# Patient Record
Sex: Male | Born: 1935 | Race: Black or African American | Hispanic: No | Marital: Married | Smoking: Former smoker
Health system: Southern US, Community
[De-identification: ages and names within clinical notes are randomized; demographics above are authoritative.]

## PROBLEM LIST (undated history)

## (undated) DIAGNOSIS — J449 Chronic obstructive pulmonary disease, unspecified: Secondary | ICD-10-CM

## (undated) DIAGNOSIS — E119 Type 2 diabetes mellitus without complications: Secondary | ICD-10-CM

## (undated) DIAGNOSIS — I10 Essential (primary) hypertension: Secondary | ICD-10-CM

## (undated) DIAGNOSIS — C801 Malignant (primary) neoplasm, unspecified: Secondary | ICD-10-CM

## (undated) DIAGNOSIS — I509 Heart failure, unspecified: Secondary | ICD-10-CM

## (undated) DIAGNOSIS — I639 Cerebral infarction, unspecified: Secondary | ICD-10-CM

## (undated) DIAGNOSIS — N289 Disorder of kidney and ureter, unspecified: Secondary | ICD-10-CM

## (undated) DIAGNOSIS — J439 Emphysema, unspecified: Secondary | ICD-10-CM

## (undated) HISTORY — PX: PROSTATECTOMY: SHX69

---

## 2008-03-01 ENCOUNTER — Inpatient Hospital Stay (HOSPITAL_COMMUNITY): Admission: EM | Admit: 2008-03-01 | Discharge: 2008-03-03 | Payer: Self-pay | Admitting: Emergency Medicine

## 2010-05-16 LAB — HEPATIC FUNCTION PANEL
ALT: 37 U/L (ref 0–53)
AST: 34 U/L (ref 0–37)
Albumin: 2.8 g/dL — ABNORMAL LOW (ref 3.5–5.2)
Alkaline Phosphatase: 41 U/L (ref 39–117)
Indirect Bilirubin: 0.5 mg/dL (ref 0.3–0.9)
Total Protein: 4.6 g/dL — ABNORMAL LOW (ref 6.0–8.3)

## 2010-05-16 LAB — BASIC METABOLIC PANEL
BUN: 18 mg/dL (ref 6–23)
CO2: 34 mEq/L — ABNORMAL HIGH (ref 19–32)
CO2: 36 mEq/L — ABNORMAL HIGH (ref 19–32)
CO2: 37 mEq/L — ABNORMAL HIGH (ref 19–32)
Calcium: 7.8 mg/dL — ABNORMAL LOW (ref 8.4–10.5)
Creatinine, Ser: 2.4 mg/dL — ABNORMAL HIGH (ref 0.4–1.5)
GFR calc Af Amer: 29 mL/min — ABNORMAL LOW (ref >60)
GFR calc non Af Amer: 24 mL/min — ABNORMAL LOW (ref >60)
GFR calc non Af Amer: 25 mL/min — ABNORMAL LOW (ref >60)
GFR calc non Af Amer: 26 mL/min — ABNORMAL LOW (ref >60)
Glucose, Bld: 143 mg/dL — ABNORMAL HIGH (ref 70–99)
Glucose, Bld: 197 mg/dL — ABNORMAL HIGH (ref 70–99)
Glucose, Bld: 84 mg/dL (ref 70–99)
Potassium: 3.3 mEq/L — ABNORMAL LOW (ref 3.5–5.1)
Potassium: 3.8 mEq/L (ref 3.5–5.1)
Sodium: 143 mEq/L (ref 135–145)
Sodium: 145 mEq/L (ref 135–145)

## 2010-05-16 LAB — GLUCOSE, CAPILLARY
Glucose-Capillary: 103 mg/dL — ABNORMAL HIGH (ref 70–99)
Glucose-Capillary: 103 mg/dL — ABNORMAL HIGH (ref 70–99)
Glucose-Capillary: 107 mg/dL — ABNORMAL HIGH (ref 70–99)
Glucose-Capillary: 110 mg/dL — ABNORMAL HIGH (ref 70–99)
Glucose-Capillary: 118 mg/dL — ABNORMAL HIGH (ref 70–99)
Glucose-Capillary: 135 mg/dL — ABNORMAL HIGH (ref 70–99)
Glucose-Capillary: 138 mg/dL — ABNORMAL HIGH (ref 70–99)
Glucose-Capillary: 145 mg/dL — ABNORMAL HIGH (ref 70–99)
Glucose-Capillary: 158 mg/dL — ABNORMAL HIGH (ref 70–99)
Glucose-Capillary: 176 mg/dL — ABNORMAL HIGH (ref 70–99)
Glucose-Capillary: 213 mg/dL — ABNORMAL HIGH (ref 70–99)
Glucose-Capillary: 225 mg/dL — ABNORMAL HIGH (ref 70–99)
Glucose-Capillary: 289 mg/dL — ABNORMAL HIGH (ref 70–99)
Glucose-Capillary: 293 mg/dL — ABNORMAL HIGH (ref 70–99)
Glucose-Capillary: 38 mg/dL — CL (ref 70–99)
Glucose-Capillary: 71 mg/dL (ref 70–99)
Glucose-Capillary: 73 mg/dL (ref 70–99)
Glucose-Capillary: 94 mg/dL (ref 70–99)
Glucose-Capillary: 96 mg/dL (ref 70–99)
Glucose-Capillary: 98 mg/dL (ref 70–99)

## 2010-05-16 LAB — URINALYSIS, ROUTINE W REFLEX MICROSCOPIC
Bilirubin Urine: NEGATIVE
Ketones, ur: NEGATIVE mg/dL
Nitrite: NEGATIVE
Specific Gravity, Urine: 1.015 (ref 1.005–1.030)
pH: 6 (ref 5.0–8.0)

## 2010-05-16 LAB — CULTURE, BLOOD (ROUTINE X 2): Report Status: 2062010

## 2010-05-16 LAB — DIFFERENTIAL
Basophils Absolute: 0 10*3/uL (ref 0.0–0.1)
Basophils Absolute: 0 10*3/uL (ref 0.0–0.1)
Basophils Relative: 0 % (ref 0–1)
Eosinophils Absolute: 0.3 10*3/uL (ref 0.0–0.7)
Eosinophils Relative: 0 % (ref 0–5)
Lymphocytes Relative: 1 % — ABNORMAL LOW (ref 12–46)
Lymphocytes Relative: 3 % — ABNORMAL LOW (ref 12–46)
Lymphs Abs: 0.3 10*3/uL — ABNORMAL LOW (ref 0.7–4.0)
Monocytes Absolute: 0.6 10*3/uL (ref 0.1–1.0)
Monocytes Absolute: 1.1 10*3/uL — ABNORMAL HIGH (ref 0.1–1.0)
Monocytes Relative: 3 % (ref 3–12)
Monocytes Relative: 5 % (ref 3–12)
Neutro Abs: 12.2 10*3/uL — ABNORMAL HIGH (ref 1.7–7.7)
Neutro Abs: 12.6 10*3/uL — ABNORMAL HIGH (ref 1.7–7.7)
Neutrophils Relative %: 91 % — ABNORMAL HIGH (ref 43–77)
Neutrophils Relative %: 93 % — ABNORMAL HIGH (ref 43–77)

## 2010-05-16 LAB — RETICULOCYTES: Retic Ct Pct: 1 % (ref 0.4–3.1)

## 2010-05-16 LAB — CBC
HCT: 30.7 % — ABNORMAL LOW (ref 39.0–52.0)
Hemoglobin: 9.5 g/dL — ABNORMAL LOW (ref 13.0–17.0)
Hemoglobin: 9.8 g/dL — ABNORMAL LOW (ref 13.0–17.0)
MCHC: 31 g/dL (ref 30.0–36.0)
MCHC: 31.8 g/dL (ref 30.0–36.0)
Platelets: 61 10*3/uL — ABNORMAL LOW (ref 150–400)
Platelets: 70 10*3/uL — ABNORMAL LOW (ref 150–400)
RBC: 3.52 MIL/uL — ABNORMAL LOW (ref 4.22–5.81)
RBC: 3.6 MIL/uL — ABNORMAL LOW (ref 4.22–5.81)
RDW: 18.4 % — ABNORMAL HIGH (ref 11.5–15.5)
RDW: 18.6 % — ABNORMAL HIGH (ref 11.5–15.5)
WBC: 13.2 10*3/uL — ABNORMAL HIGH (ref 4.0–10.5)

## 2010-05-16 LAB — IRON AND TIBC
Iron: 61 ug/dL (ref 42–135)
Saturation Ratios: 33 % (ref 20–55)

## 2010-06-13 NOTE — Discharge Summary (Signed)
NAME:  Brad Jones, Brad Jones NO.:  [id number]   MEDICAL RECORD NO.:  [id number]          PATIENT TYPE:  INP   LOCATION:  A304                          FACILITY:  [facility]   PHYSICIAN:  Dorris Singh, DO    DATE OF BIRTH:  Feb 09, 1927   DATE OF ADMISSION:  03/01/2008  DATE OF DISCHARGE:  02/03/2010LH                               DISCHARGE SUMMARY   ADMISSION DIAGNOSES:  1. Refractory hypoglycemia.  2. Type 2 diabetes.  3. Chronic obstructive pulmonary disease requiring supplemental O2.  4. Thrombocytopenia.  5. Chronic kidney disease.  6. Anemia.  7. Leukocytosis.   DISCHARGE DIAGNOSES:  1. Hypoglycemia which is improved.  2. Chronic kidney disease which is better.  3. Leukocytosis which is improving.  4. Thrombocytopenia.  5. Chronic obstructive pulmonary disease requiring supplemental      oxygen.  6. Type 2 diabetes.   The patient's H and P was done by Dr. Rito Ehrlich.  Please refer.   RADIOLOGY TESTING:  Includes portable chest x-ray on February 1 which  demonstrated COPD, emphysema, cardiomegaly, no definite acute findings.   HOSPITAL COURSE:  The patient was admitted for the above diagnoses.  He  was started on IV fluids with D10.  As his blood sugars continued to  increase he was then switched over to D5 which seemed to improve.  Then,  he was taken off of the IV drip altogether and his blood sugars have  remained stable.  Chronic kidney disease, this remained stable while he  was here.  The patient's leukocytosis, he is on Avelox and we will  continue this when he is discharged home.  His thrombocytopenia is  something that is chronic.  [facility] did  not receive his [facility] that were requested.  For COPD, the patient has  received nebulizer treatments and also supplemental O2.  He also takes  daily steroids and we will have him continue with that too.  The patient  have a irregular heart rhythm day before yesterday, but has  not had any  issues with that since then.  His a type 2 diabetic medications were  held.  We checked his hemoglobin A1c which demonstrated at 5.9 when it  was checked, which is within normal limits.  For hypoglycemia, we will  also decrease the amount of glipizide that he takes a day to see if that  improves his hypoglycemia and also recommend him to follow up with the  [facility] doctor.  I talked extensively with family members and the patient  about finding local doctors at if that is possible for him to do due to  his chronic medical conditions.   PHYSICAL EXAM:  VITAL SIGNS:  The patient was seen today.  Temperature  98.9, pulse 101, respirations 20, blood pressure 114/57.  GENERAL:  The patient is a 77 year old African male who is well  developed, well nourished with supplemental O2.  HEART:  Regular rate and rhythm.  LUNGS:  Decreased breath sounds throughout.  ABDOMEN:  Round, soft, nontender.  EXTREMITIES:  Positive pulses.  LABS:  Today are as follows.  White count 13.2, hemoglobin 9.6,  hematocrit 30.4, platelet count of 61,000.  Blood sugar 158, sodium 143,  potassium 3.8, chloride 99, CO2 36, glucose 197, BUN 16, creatinine 2.8.   CONDITION:  Stable.   DISCHARGE MEDICATIONS:  1. Glipizide 10 mg twice a day.  We will decrease the patient's      original glipizide to 5 mg twice a day.  2. Nitroglycerin 0.4 mg sublingual as needed.  3. Cloistral ointment topical twice a day.  4. Dermacin topical twice a day.  5. Fluticasone topical 0.5 twice a day.  6. Hydralazine 10 mg bedtime.  7. Vicodin 5/225 one tablet as needed.  8. Simvastatin 40 mg at bedtime.  9. Prednisone specialized dosing.  10.Moxifloxacin 400 mg daily.  11.Combivent inhaler 2 puffs 4 times a day.  12.Mometasone inhaler 2 puffs as needed.   His condition is stable.  His disposition will be to home.      Dorris Singh, DO  Electronically Signed     CB/MEDQ  D:  03/03/2008  T:  03/03/2008  Job:   [id number]

## 2010-06-13 NOTE — H&P (Signed)
NAME:  Brad Jones, Brad Jones NO.:  [id number]   MEDICAL RECORD NO.:  [id number]          PATIENT TYPE:  EMS   LOCATION:  ED                            FACILITY:  [facility]   PHYSICIAN:  Osvaldo Shipper, MD     DATE OF BIRTH:  12-23-1927   DATE OF ADMISSION:  03/01/2008  DATE OF DISCHARGE:  LH                              HISTORY & PHYSICAL   Patient goes to the [place] where he is followed by Dr. Mardee Postin.   Patient was recently discharged from the [facility] this past Thursday  after a 2-week stay for pneumonia, complicated by hemothorax.   ADMISSION DIAGNOSES:  1. Refractory hypoglycemia.  2. Type 2 diabetes.  3. Chronic obstructive pulmonary disease.  4. Thrombocytopenia.  5. Chronic kidney disease.  6. Anemia.  7. Leukocytosis, likely secondary to steroids.   CHIEF COMPLAINT:  Low blood sugar.   HISTORY OF PRESENT ILLNESS:  Patient is a 77 year old African American  male who was in his usual state of health this morning when he was found  to be disoriented by family members.  He slipped out of the bed as well.  They denied any syncopal episode.  He was having slurred speech.  CBG  was checked, which was 12.  Glucagon was given by EMS.  Patient's CBG  improved to 34, but it has been refractory to multiple D50's , and the  last reading is actually 44.  Patient was started on a D5 drip, and we  were called to assist with further management.   Currently, the patient denies any significant symptoms apart from his  chronic back pain.  He is a little bit short of breath, but he does use  O2 at home.  He is still having some pain from his chest tube on the  right side.  He has been having decreased p.o. intake, according to his  wife and daughter.  He has had low blood sugars in the past as well.   HOME MEDICATIONS:  1. Glipizide 10 mg b.i.d.  2. Nitroglycerin sublingually as needed.  3. Clobetasol ointment.  4. Fluocinonide ointment.  5. Salicylate acid sulfa  shampoo for psoriasis.  6. Dermacerin also for psoriasis.  7. Vicodin as needed.  8. Simvastatin 40 mg at bedtime.  9. Hydroxyzine 10 mg at bedtime.  10.Prednisone on a tapering dose.  11.Moxifloxacin 400 mg.  He was prescribed for 9 days, but he has      completed about 3-4 days.  12.Combivent 2 puffs as needed for 4 times a day.  13.Mometasone inhaler 2 puffs,  I believe, as needed.  14.No insulin at home.  15.He is on O2 at 2 liters.  16.He did tell me he is also on a nitro patch.   He was asked to discontinue his Lasix, HCTZ, and lisinopril when he was  discharged from the [place].   ALLERGIES:  No known drug allergies.   PAST MEDICAL HISTORY:  Positive for recent admission for complicated  pneumonia with hemothorax requiring chest tube placement.  He also has  type 2  diabetes, history of heart disease of unknown type, history of  TIAs.  No history of MI.  He also has chronic kidney disease.  According  to his daughter, he was told he had low platelets at the [place].   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   SOCIAL HISTORY:  He lives in [place] with his family.  He quit  smoking many years ago.  No alcohol use.  No illicit drug use.  He uses  a cane to ambulate.   FAMILY HISTORY:  Noncontributory.   REVIEW OF SYSTEMS:  GENERAL:  Positive for weakness, malaise  unremarkable.  HEENT:  Unremarkable.  CARDIOVASCULAR:  Unremarkable.  RESPIRATORY:  As in HPI.  GI:  Unremarkable.  GU:  Unremarkable.  ENDOCRINE:  As in HPI.  NEUROLOGIC:  Unremarkable.  PSYCHIATRIC:  Unremarkable.  Other systems unremarkable.   PHYSICAL EXAMINATION:  VITAL SIGNS:  Temperature 97.7, blood pressure  130/85, heart rate 80's, irregular.  Respiratory rate is 24.  Saturation  initially was 88% on room air, then 100% on 2 liters.  GENERAL:  This is an obese black male in no distress.  HEENT:  There is no pallor, no icterus.   Oral mucous membranes are  moist.  No oral lesions are noted.  NECK:  Soft and supple.  No thyromegaly is appreciated.  LUNGS:  A few scattered end-expiratory wheezes bilaterally.  No crackles  are present.  CARDIOVASCULAR:  S1 and S2 is irregular.  No murmurs appreciated.  No  S3, S4, no rubs or bruits.  ABDOMEN:  Soft, nontender, nondistended.  Over his right side of the  abdomen as well as the chest, there is bruising noted, presumably from  his recent admission.  He actually has a large dressing on the right  chest wall.  EXTREMITIES:  Pitting edema 1+ bilaterally.  SKIN:  Psoriatic lesions over his knees and his lower back.  NEUROLOGIC:  Alert and oriented x3.  No focal neuro deficits are  present.   LAB DATA:  White count 23,000.  Hemoglobin is 9.8.  Hematocrit is 30.7.  MCV is 85.  Platelet count is 81.  Sodium 145, potassium 3.3, chloride  106, bicarb 34, glucose 84, BUN 25, creatinine 2.58.  UA shows moderate  blood, 7-10 RBCs.   Chest x-ray actually shows COPD with no acute findings.   ASSESSMENT:  A 77 year old [place] American male who has a past medical  history, as stated earlier, who presents with hypoglycemia.  This has  been refractory so far to the D50's.  I think this is the result of a  combination of renal failure, poor p.o. intake, and the fact that he is  on glipizide.   PLAN:  1. Refractory hypoglycemia:  We will put him on a D10 drip until his      blood sugar stabilizes.  Obviously hold his glipizide and monitor      him closely on the floor.  2. Irregular heart rhythm:  Check an EKG.  3. Thrombocytopenia:  Likely the result of his acute illness recently.      We will follow it closely.  4. Leukocytosis:  Likely a result of recent infection and the fact      that he is on steroids.  We will continue with the steroid taper      for now.  5. Chronic kidney disease:  We will await  labs from [facility] to      check how his kidney function was over  there.  6. Chronic obstructive pulmonary disease:  Start him on nebulizer      treatments.  Monitor him closely.   Further management decision will be based on the results of initial  testing and patient's response to treatment.      Osvaldo Shipper, MD  Electronically Signed     GK/MEDQ  D:  03/01/2008  T:  03/01/2008  Job:  [id number]   cc:   Dr. Otis Dials, [facility]

## 2010-06-13 NOTE — Group Therapy Note (Signed)
NAME:  Brad Jones, GOBERT NO.:  [id number]   MEDICAL RECORD NO.:  [id number]          PATIENT TYPE:  INP   LOCATION:  A304                          FACILITY:  [facility]   PHYSICIAN:  Osvaldo Shipper, MD     DATE OF BIRTH:  12-Jul-1927   DATE OF PROCEDURE:  03/02/2008  DATE OF DISCHARGE:                                 PROGRESS NOTE   SUBJECTIVE:  The patient is feeling better.  He denies any shortness of  breath or chest pain.  He does not have any new complaints today.   OBJECTIVE:  VITAL SIGNS:  Temperature 98.7, heart rate 99 and regular,  respiratory rate 20, blood pressure 147/92, saturation 95% on 3 liters.  GENERAL:  This is an elderly black male in no distress.  HEENT:  There is no pallor, no icterus.  Oral mucous membranes moist.  No oral lesions are noted.  NECK:  Soft and supple.  LUNGS:  Reveal poor entry at the bases, otherwise clear to auscultation.  CARDIOVASCULAR:  S1-S2 is normal, regular.  ABDOMEN:  Soft, nontender, nondistended.  EXTREMITIES:  Show no edema.  SKIN:  Shows psoriatic lesions over both knees.   LABORATORY DATA:  This morning, his white count has improved to 13,900  from 23,000 yesterday.  Hemoglobin is 9.5 which is stable.  MCV is 86,  platelet count is 70, which has decreased slightly from 81 yesterday.  His CBGs, the last two readings have been greater than 200, it has been  slowly rising since yesterday.  His renal function shows improvement in  BUN to 18, creatinine is 2.4, bicarb is 37.  Blood cultures negative so  far.   ASSESSMENT:  1. Hypoglycemia.  This seems to be improving.  He is still on a D10      drip.  If his blood sugars for the next two readings are greater      than 200, he can be changed over to a D5 drip.  Continue CBGs q.3      h., after that and if they are still elevated, the D5 can be      changed over to saline lock.  The reason for his hypoglycemia is      likely the sulfonylurea that the patient was on in  combination with      his renal failure and poor p.o. intake at home.  2. Chronic kidney disease, stable.  3. Leukocytosis likely a sequelae of his recent infection in the form      of pneumonia, as well as his prednisone use.  It is improving.  4. Thrombocytopenia.  I was told by his daughter that he was noted to      have low platelets even at the [place], so this probably is also      chronic.  We are awaiting records from the [place] to corroborate this.  5. History of chronic obstructive pulmonary disease.  He is on      nebulizer treatments.  He was having wheezing yesterday which seems      to have resolved  at this time.  He will be continued on the      nebulizers.  He is already on steroids.  6. He was noted to have an irregular heart rhythm yesterday.  EKG was      done which shows sinus rhythm with marked sinus arrhythmia.   For his type 2 diabetes, we are obviously holding all of his diabetic  medications.  HbA1c is pending at this time.   Anticipating his blood sugars continue to improve and he will be able to  go home by tomorrow.      Osvaldo Shipper, MD  Electronically Signed     GK/MEDQ  D:  03/02/2008  T:  03/02/2008  Job:  [id number]

## 2012-08-09 ENCOUNTER — Emergency Department (HOSPITAL_COMMUNITY): Payer: Medicare Other

## 2012-08-09 ENCOUNTER — Inpatient Hospital Stay (HOSPITAL_COMMUNITY)
Admission: EM | Admit: 2012-08-09 | Discharge: 2012-08-18 | DRG: 870 | Disposition: A | Discharge status: Rehabilitation Facility | Payer: Medicare Other | Attending: Internal Medicine | Admitting: Internal Medicine

## 2012-08-09 ENCOUNTER — Encounter (HOSPITAL_COMMUNITY): Payer: Self-pay

## 2012-08-09 DIAGNOSIS — R5381 Other malaise: Secondary | ICD-10-CM | POA: Diagnosis not present

## 2012-08-09 DIAGNOSIS — H3530 Unspecified macular degeneration: Secondary | ICD-10-CM | POA: Diagnosis present

## 2012-08-09 DIAGNOSIS — Z8546 Personal history of malignant neoplasm of prostate: Secondary | ICD-10-CM

## 2012-08-09 DIAGNOSIS — Z87891 Personal history of nicotine dependence: Secondary | ICD-10-CM

## 2012-08-09 DIAGNOSIS — G9341 Metabolic encephalopathy: Secondary | ICD-10-CM | POA: Diagnosis present

## 2012-08-09 DIAGNOSIS — N179 Acute kidney failure, unspecified: Secondary | ICD-10-CM

## 2012-08-09 DIAGNOSIS — J9600 Acute respiratory failure, unspecified whether with hypoxia or hypercapnia: Secondary | ICD-10-CM | POA: Diagnosis present

## 2012-08-09 DIAGNOSIS — J9690 Respiratory failure, unspecified, unspecified whether with hypoxia or hypercapnia: Secondary | ICD-10-CM

## 2012-08-09 DIAGNOSIS — A419 Sepsis, unspecified organism: Secondary | ICD-10-CM | POA: Diagnosis present

## 2012-08-09 DIAGNOSIS — N184 Chronic kidney disease, stage 4 (severe): Secondary | ICD-10-CM | POA: Diagnosis present

## 2012-08-09 DIAGNOSIS — I129 Hypertensive chronic kidney disease with stage 1 through stage 4 chronic kidney disease, or unspecified chronic kidney disease: Secondary | ICD-10-CM | POA: Diagnosis present

## 2012-08-09 DIAGNOSIS — D696 Thrombocytopenia, unspecified: Secondary | ICD-10-CM | POA: Diagnosis present

## 2012-08-09 DIAGNOSIS — Z79899 Other long term (current) drug therapy: Secondary | ICD-10-CM

## 2012-08-09 DIAGNOSIS — D649 Anemia, unspecified: Secondary | ICD-10-CM | POA: Diagnosis present

## 2012-08-09 DIAGNOSIS — N170 Acute kidney failure with tubular necrosis: Secondary | ICD-10-CM | POA: Diagnosis present

## 2012-08-09 DIAGNOSIS — I69959 Hemiplegia and hemiparesis following unspecified cerebrovascular disease affecting unspecified side: Secondary | ICD-10-CM

## 2012-08-09 DIAGNOSIS — I2789 Other specified pulmonary heart diseases: Secondary | ICD-10-CM | POA: Diagnosis present

## 2012-08-09 DIAGNOSIS — Z9079 Acquired absence of other genital organ(s): Secondary | ICD-10-CM

## 2012-08-09 DIAGNOSIS — Z7982 Long term (current) use of aspirin: Secondary | ICD-10-CM

## 2012-08-09 DIAGNOSIS — J13 Pneumonia due to Streptococcus pneumoniae: Secondary | ICD-10-CM | POA: Diagnosis present

## 2012-08-09 DIAGNOSIS — A409 Streptococcal sepsis, unspecified: Principal | ICD-10-CM | POA: Diagnosis present

## 2012-08-09 DIAGNOSIS — J189 Pneumonia, unspecified organism: Secondary | ICD-10-CM

## 2012-08-09 DIAGNOSIS — L408 Other psoriasis: Secondary | ICD-10-CM | POA: Diagnosis present

## 2012-08-09 DIAGNOSIS — I509 Heart failure, unspecified: Secondary | ICD-10-CM | POA: Diagnosis present

## 2012-08-09 DIAGNOSIS — J438 Other emphysema: Secondary | ICD-10-CM | POA: Diagnosis present

## 2012-08-09 DIAGNOSIS — R197 Diarrhea, unspecified: Secondary | ICD-10-CM | POA: Diagnosis not present

## 2012-08-09 DIAGNOSIS — R6521 Severe sepsis with septic shock: Secondary | ICD-10-CM | POA: Diagnosis present

## 2012-08-09 DIAGNOSIS — E119 Type 2 diabetes mellitus without complications: Secondary | ICD-10-CM | POA: Diagnosis present

## 2012-08-09 DIAGNOSIS — E870 Hyperosmolality and hypernatremia: Secondary | ICD-10-CM | POA: Diagnosis present

## 2012-08-09 DIAGNOSIS — J9601 Acute respiratory failure with hypoxia: Secondary | ICD-10-CM

## 2012-08-09 HISTORY — DX: Heart failure, unspecified: I50.9

## 2012-08-09 HISTORY — DX: Essential (primary) hypertension: I10

## 2012-08-09 HISTORY — DX: Disorder of kidney and ureter, unspecified: N28.9

## 2012-08-09 HISTORY — DX: Malignant (primary) neoplasm, unspecified: C80.1

## 2012-08-09 HISTORY — DX: Emphysema, unspecified: J43.9

## 2012-08-09 HISTORY — DX: Chronic obstructive pulmonary disease, unspecified: J44.9

## 2012-08-09 HISTORY — DX: Type 2 diabetes mellitus without complications: E11.9

## 2012-08-09 HISTORY — DX: Cerebral infarction, unspecified: I63.9

## 2012-08-09 LAB — BLOOD GAS, ARTERIAL
MECHVT: 700 mL
MECHVT: 600 mL
PEEP: 5 cmH2O
Patient temperature: 37
RATE: 16 resp/min
TCO2: 21 mmol/L (ref 0–100)
TCO2: 17 mmol/L (ref 0–100)
pCO2 arterial: 60 mmHg (ref 35.0–45.0)
pCO2 arterial: 28.2 mmHg — ABNORMAL LOW (ref 35.0–45.0)
pH, Arterial: 7.191 — CL (ref 7.350–7.450)
pH, Arterial: 7.448 (ref 7.350–7.450)
pO2, Arterial: 144 mmHg — ABNORMAL HIGH (ref 80.0–100.0)

## 2012-08-09 LAB — COMPREHENSIVE METABOLIC PANEL
ALT: 6 U/L (ref 0–53)
AST: 10 U/L (ref 0–37)
Alkaline Phosphatase: 58 U/L (ref 39–117)
CO2: 27 mEq/L (ref 19–32)
Chloride: 104 mEq/L (ref 96–112)
GFR calc Af Amer: 12 mL/min — ABNORMAL LOW (ref >90)
GFR calc non Af Amer: 10 mL/min — ABNORMAL LOW (ref >90)
Glucose, Bld: 132 mg/dL — ABNORMAL HIGH (ref 70–99)
Sodium: 144 mEq/L (ref 135–145)
Total Bilirubin: 0.5 mg/dL (ref 0.3–1.2)

## 2012-08-09 LAB — CBC WITH DIFFERENTIAL/PLATELET
Band Neutrophils: 13 % — ABNORMAL HIGH (ref 0–10)
Basophils Absolute: 0 10*3/uL (ref 0.0–0.1)
Basophils Relative: 0 % (ref 0–1)
Blasts: 0 %
HCT: 45.5 % (ref 39.0–52.0)
Hemoglobin: 13.4 g/dL (ref 13.0–17.0)
Lymphocytes Relative: 2 % — ABNORMAL LOW (ref 12–46)
MCH: 25.9 pg — ABNORMAL LOW (ref 26.0–34.0)
MCHC: 29.5 g/dL — ABNORMAL LOW (ref 30.0–36.0)
Myelocytes: 0 %
Neutrophils Relative %: 84 % — ABNORMAL HIGH (ref 43–77)
Promyelocytes Absolute: 0 %
RDW: 15.7 % — ABNORMAL HIGH (ref 11.5–15.5)

## 2012-08-09 LAB — URINALYSIS, ROUTINE W REFLEX MICROSCOPIC
Bilirubin Urine: NEGATIVE
Glucose, UA: NEGATIVE mg/dL
Ketones, ur: NEGATIVE mg/dL
Leukocytes, UA: NEGATIVE
pH: 7 (ref 5.0–8.0)

## 2012-08-09 LAB — URINE MICROSCOPIC-ADD ON

## 2012-08-09 MED ORDER — VECURONIUM BROMIDE 10 MG IV SOLR
INTRAVENOUS | Status: AC
  Filled 2012-08-09: qty 10

## 2012-08-09 MED ORDER — DOPAMINE-DEXTROSE 3.2-5 MG/ML-% IV SOLN
INTRAVENOUS | Status: AC
  Filled 2012-08-09: qty 250

## 2012-08-09 MED ORDER — DEXTROSE 5 % IV SOLN
500.0000 mg | Freq: Once | INTRAVENOUS | Status: AC
  Administered 2012-08-09: 500 mg via INTRAVENOUS
  Filled 2012-08-09: qty 500

## 2012-08-09 MED ORDER — ETOMIDATE 2 MG/ML IV SOLN
20.0000 mg | Freq: Once | INTRAVENOUS | Status: AC
  Administered 2012-08-09: 20 mg via INTRAVENOUS

## 2012-08-09 MED ORDER — SUCCINYLCHOLINE CHLORIDE 20 MG/ML IJ SOLN
125.0000 mg | Freq: Once | INTRAMUSCULAR | Status: AC
  Administered 2012-08-09: 125 mg via INTRAVENOUS

## 2012-08-09 MED ORDER — ROCURONIUM BROMIDE 50 MG/5ML IV SOLN
INTRAVENOUS | Status: AC
  Filled 2012-08-09: qty 2

## 2012-08-09 MED ORDER — ETOMIDATE 2 MG/ML IV SOLN
INTRAVENOUS | Status: AC
  Filled 2012-08-09: qty 20

## 2012-08-09 MED ORDER — DOPAMINE-DEXTROSE 3.2-5 MG/ML-% IV SOLN
5.0000 ug/kg/min | Freq: Once | INTRAVENOUS | Status: AC
  Administered 2012-08-09: 5 ug/kg/min via INTRAVENOUS

## 2012-08-09 MED ORDER — SUCCINYLCHOLINE CHLORIDE 20 MG/ML IJ SOLN
INTRAMUSCULAR | Status: AC
  Filled 2012-08-09: qty 1

## 2012-08-09 MED ORDER — LIDOCAINE HCL (CARDIAC) 20 MG/ML IV SOLN
INTRAVENOUS | Status: AC
  Filled 2012-08-09: qty 5

## 2012-08-09 MED ORDER — VECURONIUM BROMIDE 10 MG IV SOLR
7.0000 mg | Freq: Once | INTRAVENOUS | Status: AC
  Administered 2012-08-09: 7 mg via INTRAVENOUS

## 2012-08-09 MED ORDER — DEXTROSE 5 % IV SOLN
1.0000 g | Freq: Once | INTRAVENOUS | Status: AC
  Administered 2012-08-09: 1 g via INTRAVENOUS
  Filled 2012-08-09: qty 10

## 2012-08-09 NOTE — ED Notes (Signed)
[facility] here to transport pt 

## 2012-08-09 NOTE — ED Notes (Signed)
Status update from primary nurse:  Pt has 7.5 ET tube 22 at lips in place with RT present with ventilator, 50F OG tube in place, verified by auscultation, 88F Temp foley catheter in place draining scant urine. Pt has dopamine infusing at 5/mcg/kg/min = 9.64ml/hr. Pt remains sedated at the moment. Pt has 2 IV's in place, 20g rt AC, 20g rt FA.

## 2012-08-09 NOTE — ED Provider Notes (Signed)
History  This chart was scribed for Brad Hutching, MD by Ardelia Mems, ED Scribe. This patient was seen in room APA01/APA01 and the patient's care was started at 7:47 PM.  CSN: [id number]  Arrival date & time 08/09/12  1945   Chief Complaint  Patient presents with  . Unresponsive    The history is provided by the EMS personnel. The history is limited by the condition of the patient. No language interpreter was used.   LEVEL 5 CAVEAT (PATIENT UNRESPONSIVE)  HPI Comments: Brad Jones is a 79 y.o. Male with a hx of HTN brought by [facility] to the Emergency Department who is unresponsive. Pt was at home sitting on his carport, when he suddenly lost consciousness and fell out of his chair. Relatives believed it may have been a diabetic issue- EMS checked this and found a blood sugar of 140. Pt has reportedly not been aroused since becoming unresponsive. He was found with shallow, slow breathing of about 4 to 6 respirations per minute. Pt was given an oral pharyngeal airway and supplemental oxygen. Pt is currently being treated with BVM. EMS reports that pt has strong pulses. Pt's known at home medications include albuterol, lasix and Vicodin. Pt does not have any known allergies to medications. Pt's family is waiting to see pt and may be able to provide more history.        Further history obtained from wife and children. Apparently patient did not feel well all day. He had a pain frothy material in his oral cavity. His wife reported that he did not feel well since early this morning and was essentially bedridden today. He further deteriorated later in the afternoon and EMS was notified   Past Medical History  Diagnosis Date  . Hypertension     History reviewed. No pertinent past surgical history.  No family history on file.  History  Substance Use Topics  . Smoking status: Not on file  . Smokeless tobacco: Not on file  . Alcohol Use: Not on file    Review of Systems  Unable to perform  ROS: Patient unresponsive  A complete 10 system review of systems was obtained and all systems are negative except as noted in the HPI and PMH.   Allergies  Review of patient's allergies indicates no known allergies.  Home Medications   Current Outpatient Rx  Name  Route  Sig  Dispense  Refill  . albuterol (PROVENTIL HFA;VENTOLIN HFA) 108 (90 BASE) MCG/ACT inhaler   Inhalation   Inhale 2 puffs into the lungs every 6 (six) hours as needed for wheezing.         . calcitRIOL (ROCALTROL) 0.25 MCG capsule   Oral   Take 0.25 mcg by mouth daily.         Marland Kitchen docusate sodium (COLACE) 50 MG capsule   Oral   Take by mouth at bedtime.         . fluocinonide ointment (LIDEX) 0.05 %   Topical   Apply topically 2 (two) times daily.         . furosemide (LASIX) 40 MG tablet   Oral   Take 40 mg by mouth daily.         Marland Kitchen HYDROcodone-acetaminophen (NORCO/VICODIN) 5-325 MG per tablet   Oral   Take 1 tablet by mouth every 6 (six) hours as needed for pain.         . metoprolol (TOPROL-XL) 200 MG 24 hr tablet   Oral   Take  200 mg by mouth daily.         Marland Kitchen terazosin (HYTRIN) 2 MG capsule   Oral   Take 2 mg by mouth at bedtime.         Marland Kitchen tiotropium (SPIRIVA) 18 MCG inhalation capsule   Inhalation   Place 18 mcg into inhaler and inhale daily.           Triage Vitals: BP 157/93  Pulse 107  Temp(Src) 98.8 F (37.1 C)  Resp 23  Ht 5\' 8"  (1.727 m)  Wt 220 lb (99.791 kg)  BMI 33.46 kg/m2  SpO2 100%  Physical Exam  Nursing note and vitals reviewed. Constitutional: He is oriented to person, place, and time. He appears well-developed and well-nourished.  HENT:  Head: Normocephalic and atraumatic.  Eyes: Conjunctivae and EOM are normal. Pupils are equal, round, and reactive to light.  Neck: Normal range of motion. Neck supple.  Cardiovascular: Normal rate, regular rhythm and normal heart sounds.   Pulmonary/Chest: Effort normal and breath sounds normal.  Abdominal:  Soft. Bowel sounds are normal.  Musculoskeletal: Normal range of motion.  Neurological: He is alert and oriented to person, place, and time.  Skin: Skin is warm and dry.  Psychiatric: He has a normal mood and affect.    ED Course  Procedures (including critical care time)............... Intubation:     Patient was intubated with a 7.5 endotracheal tube with minimal complications after rapid sequence induction using the following medications.: Etomidate 20 mg IV and succinylcholine 125 mg IV.   Post procedure x-ray shows good ET tube placement.  DIAGNOSTIC STUDIES: Oxygen Saturation is 100% on BVM and supplemental oxygen, normal by my interpretation.    COORDINATION OF CARE: 7:55 PM- Suctioned pt's airway and performed other emergency procedures and medication administrations. 8:36 PM- Recheck and pt is sedated and breathing a little closer to a normal rate.   Medications  lidocaine (cardiac) 100 mg/13ml (XYLOCAINE) 20 MG/ML injection 2% (  Not Given 08/09/12 2134)  rocuronium (ZEMURON) 50 MG/5ML injection (  Not Given 08/09/12 2135)  etomidate (AMIDATE) injection 20 mg (20 mg Intravenous Given 08/09/12 1954)  succinylcholine (ANECTINE) injection 125 mg (125 mg Intravenous Given 08/09/12 1955)  vecuronium (NORCURON) injection 7 mg (7 mg Intravenous Given 08/09/12 2034)  DOPamine (INTROPIN) 800 mg in dextrose 5 % 250 mL infusion (3 mcg/kg/min  99.8 kg Intravenous Rate/Dose Change 08/09/12 2208)  cefTRIAXone (ROCEPHIN) 1 g in dextrose 5 % 50 mL IVPB (0 g Intravenous Stopped 08/09/12 2125)  azithromycin (ZITHROMAX) 500 mg in dextrose 5 % 250 mL IVPB (0 mg Intravenous Stopped 08/09/12 2231)    Labs Reviewed  CBC WITH DIFFERENTIAL - Abnormal; Notable for the following:    WBC 30.1 (*)    MCH 25.9 (*)    MCHC 29.5 (*)    RDW 15.7 (*)    Platelets 109 (*)    Neutrophils Relative % 84 (*)    Lymphocytes Relative 2 (*)    Monocytes Relative 1 (*)    Band Neutrophils 13 (*)    Neutro Abs 29.2  (*)    Lymphs Abs 0.6 (*)    All other components within normal limits  COMPREHENSIVE METABOLIC PANEL - Abnormal; Notable for the following:    Glucose, Bld 132 (*)    BUN 39 (*)    Creatinine, Ser 4.86 (*)    Albumin 2.9 (*)    GFR calc non Af Amer 10 (*)    GFR calc Af Amer 12 (*)  All other components within normal limits  LACTIC ACID, PLASMA - Abnormal; Notable for the following:    Lactic Acid, Venous 6.6 (*)    All other components within normal limits  URINALYSIS, ROUTINE W REFLEX MICROSCOPIC - Abnormal; Notable for the following:    Hgb urine dipstick MODERATE (*)    All other components within normal limits  BLOOD GAS, ARTERIAL - Abnormal; Notable for the following:    pH, Arterial 7.191 (*)    pCO2 arterial 60.0 (*)    pO2, Arterial 78.5 (*)    Acid-base deficit 4.9 (*)    All other components within normal limits  BLOOD GAS, ARTERIAL - Abnormal; Notable for the following:    pCO2 arterial 28.2 (*)    pO2, Arterial 144.0 (*)    Bicarbonate 19.2 (*)    Acid-base deficit 4.1 (*)    All other components within normal limits  URINE MICROSCOPIC-ADD ON - Abnormal; Notable for the following:    Bacteria, UA FEW (*)    All other components within normal limits  CULTURE, BLOOD (ROUTINE X 2)  CULTURE, BLOOD (ROUTINE X 2)  URINE CULTURE  TROPONIN I    Ct Head Wo Contrast  08/09/2012   *RADIOLOGY REPORT*  Clinical Data: Unresponsive.  On ventilator.  Hypertension. Multiple prior infarcts.  Prostate cancer.  CT HEAD WITHOUT CONTRAST  Technique:  Contiguous axial images were obtained from the base of the skull through the vertex without contrast.  Comparison: None.  Findings: Artifact on the present exam slightly limits evaluation.  No intracranial hemorrhage.  Moderate small vessel disease type changes.  Right frontal lobe infarct of indeterminate age.  Global atrophy without hydrocephalus.  Vascular calcifications.  No intracranial mass lesion detected on this unenhanced exam.   Mastoid air cells, middle ear cavities and visualized sinuses are clear.  IMPRESSION: Artifact on the present exam slightly limits evaluation.  No intracranial hemorrhage.  Moderate small vessel disease type changes.  Right frontal lobe infarct of indeterminate age.  Global atrophy without hydrocephalus.   Original Report Authenticated By: Lacy Duverney, M.D.   Dg Chest Portable 1 View  08/09/2012   *RADIOLOGY REPORT*  Clinical Data: Intubated.  PORTABLE CHEST - 1 VIEW  Comparison: 03/01/2008.  Findings: Poorly visualized endotracheal tube with its tip most likely at the level of the clavicles.  Patchy opacity in the right upper lobe.  Minimal patchy opacity at both lung bases.  Increased prominence of the interstitial markings.  The cardiac silhouette remains borderline enlarged.  Thoracic spine degenerative changes.  IMPRESSION:  1.  Poorly visualized endotracheal tube, most likely in satisfactory position. 2.  Right upper lobe probable pneumonia. 3.  Mild bibasilar atelectasis or pneumonia. 4.  Increased interstitial lung disease with stable borderline cardiomegaly.   Original Report Authenticated By: Beckie Salts, M.D.     1. Community acquired pneumonia   2. Respiratory failure     CRITICAL CARE Performed by: Brad Jones Total critical care time: 60 Critical care time was exclusive of separately billable procedures and treating other patients. Critical care was necessary to treat or prevent imminent or life-threatening deterioration. Critical care was time spent personally by me on the following activities: development of treatment plan with patient and/or surrogate as well as nursing, discussions with consultants, evaluation of patient's response to treatment, examination of patient, obtaining history from patient or surrogate, ordering and performing treatments and interventions, ordering and review of laboratory studies, ordering and review of radiographic studies, pulse oximetry and  re-evaluation of  patient's condition.   Date: 08/09/2012  Rate: 107  Rhythm: sinus tachycardia  QRS Axis: right  Intervals: normal  ST/T Wave abnormalities: ST depressions anteriorly  Conduction Disutrbances:none  Narrative Interpretation:   Old EKG Reviewed: changes noted  MDM  Patient was initially obtunded, hypotensive, tachycardic. He had good peripheral pulses. IV access was obtained via the nursing staff. Intubation by self.   Fluid bolus given. Chest x-ray shows probable right lower lobe pneumonia. IV Rocephin and IV Zithromax started. Arterial blood gas showed hypercapnia and a respiratory acidosis. Ventilation rate was increased. Dopamine was started. Blood pressure improved and pulse stayed around 100. Discussed with critical care.       I personally performed the services described in this documentation, which was scribed in my presence. The recorded information has been reviewed and is accurate.       Brad Hutching, MD 08/10/12 2012

## 2012-08-09 NOTE — ED Notes (Signed)
Pt was sitting under carport with family members when he fell over in the chair onto the ground.  Pt remains unresponsive, strong pulses, breathing assisted with bvm per [facility].

## 2012-08-09 NOTE — ED Notes (Signed)
Pt's family going home, will got to [facility] tomorrow. Information collected. Pt's wife Synetta Fail  Pt's daughter Carolyn [place] best number to reach is [phone].

## 2012-08-09 NOTE — ED Notes (Signed)
Pt recognized family members and nodded when wife asked him questions. Pt responded that he is not in pain. VS WDL at this time, awaiting [facility] transfer.

## 2012-08-09 NOTE — ED Notes (Addendum)
Pt's BP trending below 100 systolic, Dopamine changed tp 57mcg/kg/min = 15 ml/hr

## 2012-08-09 NOTE — ED Notes (Signed)
Pt's belongings sent with pt's family

## 2012-08-09 NOTE — ED Notes (Signed)
Vent settings currently 100% O2, 5 PEEP, 24 RR, 600 [place].

## 2012-08-09 NOTE — Accreditation Note (Addendum)
Adjusted PT vent settings after blood gas from rate 24 to 20. VT 600 , peep 5, 100 fio2 still. Pt still etsx out large amount yellow blood stained secretions Awaiting transport to [facility].

## 2012-08-09 NOTE — ED Notes (Signed)
Dopamine rate changed to 3mcg/kg/min = 3.64ml/hr per Dr. Adriana Simas.

## 2012-08-09 NOTE — ED Notes (Signed)
Pt back from CT, VS remain stable, BP improved.

## 2012-08-09 NOTE — ED Notes (Signed)
[facility] ETA 15 minutes 

## 2012-08-10 ENCOUNTER — Inpatient Hospital Stay (HOSPITAL_COMMUNITY): Payer: Medicare Other

## 2012-08-10 DIAGNOSIS — J189 Pneumonia, unspecified organism: Secondary | ICD-10-CM

## 2012-08-10 DIAGNOSIS — G9340 Encephalopathy, unspecified: Secondary | ICD-10-CM

## 2012-08-10 DIAGNOSIS — J9601 Acute respiratory failure with hypoxia: Secondary | ICD-10-CM

## 2012-08-10 DIAGNOSIS — A419 Sepsis, unspecified organism: Secondary | ICD-10-CM

## 2012-08-10 DIAGNOSIS — N179 Acute kidney failure, unspecified: Secondary | ICD-10-CM

## 2012-08-10 DIAGNOSIS — G936 Cerebral edema: Secondary | ICD-10-CM

## 2012-08-10 LAB — STREP PNEUMONIAE URINARY ANTIGEN: Strep Pneumo Urinary Antigen: POSITIVE — AB

## 2012-08-10 LAB — CBC
Hemoglobin: 12.4 g/dL — ABNORMAL LOW (ref 13.0–17.0)
MCHC: 30.2 g/dL (ref 30.0–36.0)
RBC: 4.95 MIL/uL (ref 4.22–5.81)

## 2012-08-10 LAB — POCT I-STAT 3, ART BLOOD GAS (G3+)
Bicarbonate: 22.7 mEq/L (ref 20.0–24.0)
Patient temperature: 100.5
TCO2: 24 mmol/L (ref 0–100)
pCO2 arterial: 43.5 mmHg (ref 35.0–45.0)
pH, Arterial: 7.33 — ABNORMAL LOW (ref 7.350–7.450)
pO2, Arterial: 64 mmHg — ABNORMAL LOW (ref 80.0–100.0)

## 2012-08-10 LAB — BASIC METABOLIC PANEL
CO2: 25 mEq/L (ref 19–32)
GFR calc non Af Amer: 11 mL/min — ABNORMAL LOW (ref >90)
Glucose, Bld: 131 mg/dL — ABNORMAL HIGH (ref 70–99)
Potassium: 4.1 mEq/L (ref 3.5–5.1)
Sodium: 141 mEq/L (ref 135–145)

## 2012-08-10 LAB — URINALYSIS, ROUTINE W REFLEX MICROSCOPIC
Glucose, UA: NEGATIVE mg/dL
Protein, ur: 100 mg/dL — AB
Specific Gravity, Urine: 1.018 (ref 1.005–1.030)

## 2012-08-10 LAB — LACTIC ACID, PLASMA
Lactic Acid, Venous: 4.4 mmol/L — ABNORMAL HIGH (ref 0.5–2.2)
Lactic Acid, Venous: 2.6 mmol/L — ABNORMAL HIGH (ref 0.5–2.2)

## 2012-08-10 LAB — TROPONIN I: Troponin I: 0.82 ng/mL (ref <0.30)

## 2012-08-10 LAB — URINE MICROSCOPIC-ADD ON

## 2012-08-10 LAB — GLUCOSE, CAPILLARY

## 2012-08-10 MED ORDER — FENTANYL BOLUS VIA INFUSION
25.0000 ug | Freq: Four times a day (QID) | INTRAVENOUS | Status: DC | PRN
  Administered 2012-08-12: 50 ug via INTRAVENOUS
  Administered 2012-08-13 (×2): 100 ug via INTRAVENOUS
  Filled 2012-08-10: qty 100

## 2012-08-10 MED ORDER — CHLORHEXIDINE GLUCONATE 0.12 % MT SOLN
15.0000 mL | Freq: Two times a day (BID) | OROMUCOSAL | Status: DC
  Administered 2012-08-10 – 2012-08-14 (×9): 15 mL via OROMUCOSAL
  Filled 2012-08-10 (×8): qty 15
  Filled 2012-08-10: qty 30

## 2012-08-10 MED ORDER — PANTOPRAZOLE SODIUM 40 MG IV SOLR
40.0000 mg | Freq: Every day | INTRAVENOUS | Status: DC
  Administered 2012-08-10 – 2012-08-13 (×5): 40 mg via INTRAVENOUS
  Filled 2012-08-10 (×7): qty 40

## 2012-08-10 MED ORDER — VANCOMYCIN HCL 10 G IV SOLR
1500.0000 mg | INTRAVENOUS | Status: DC
  Administered 2012-08-10 – 2012-08-12 (×2): 1500 mg via INTRAVENOUS
  Filled 2012-08-10 (×2): qty 1500

## 2012-08-10 MED ORDER — ALBUMIN HUMAN 25 % IV SOLN
12.5000 g | Freq: Once | INTRAVENOUS | Status: AC
  Administered 2012-08-10: 12.5 g via INTRAVENOUS
  Filled 2012-08-10: qty 50

## 2012-08-10 MED ORDER — ASPIRIN 300 MG RE SUPP
300.0000 mg | RECTAL | Status: AC
  Administered 2012-08-10: 300 mg via RECTAL
  Filled 2012-08-10: qty 1

## 2012-08-10 MED ORDER — BIOTENE DRY MOUTH MT LIQD
15.0000 mL | Freq: Four times a day (QID) | OROMUCOSAL | Status: DC
  Administered 2012-08-10 – 2012-08-15 (×19): 15 mL via OROMUCOSAL

## 2012-08-10 MED ORDER — MIDAZOLAM HCL 2 MG/2ML IJ SOLN
2.0000 mg | INTRAMUSCULAR | Status: DC | PRN
Start: 2012-08-10 — End: 2012-08-14
  Administered 2012-08-10 – 2012-08-13 (×7): 2 mg via INTRAVENOUS
  Filled 2012-08-10 (×7): qty 2

## 2012-08-10 MED ORDER — PIPERACILLIN-TAZOBACTAM IN DEX 2-0.25 GM/50ML IV SOLN
2.2500 g | Freq: Four times a day (QID) | INTRAVENOUS | Status: DC
  Administered 2012-08-10 – 2012-08-15 (×19): 2.25 g via INTRAVENOUS
  Filled 2012-08-10 (×21): qty 50

## 2012-08-10 MED ORDER — DEXTROSE 5 % IV SOLN
1.0000 g | Freq: Two times a day (BID) | INTRAVENOUS | Status: DC
  Administered 2012-08-10: 1 g via INTRAVENOUS
  Filled 2012-08-10 (×2): qty 10

## 2012-08-10 MED ORDER — SODIUM CHLORIDE 0.9 % IV SOLN: 250.0000 mL | INTRAVENOUS | Status: DC | PRN

## 2012-08-10 MED ORDER — SODIUM CHLORIDE 0.9 % IV SOLN
INTRAVENOUS | Status: DC
  Administered 2012-08-10 – 2012-08-14 (×5): via INTRAVENOUS

## 2012-08-10 MED ORDER — DOPAMINE-DEXTROSE 3.2-5 MG/ML-% IV SOLN: 2.0000 ug/kg/min | INTRAVENOUS | Status: DC

## 2012-08-10 MED ORDER — SODIUM CHLORIDE 0.9 % IV BOLUS (SEPSIS)
1000.0000 mL | Freq: Once | INTRAVENOUS | Status: AC
  Administered 2012-08-10: 1000 mL via INTRAVENOUS

## 2012-08-10 MED ORDER — ALBUTEROL SULFATE HFA 108 (90 BASE) MCG/ACT IN AERS
8.0000 | INHALATION_SPRAY | Freq: Four times a day (QID) | RESPIRATORY_TRACT | Status: DC
  Administered 2012-08-10 – 2012-08-14 (×19): 8 via RESPIRATORY_TRACT
  Filled 2012-08-10 (×2): qty 6.7

## 2012-08-10 MED ORDER — SODIUM CHLORIDE 0.9 % IV BOLUS (SEPSIS)
2000.0000 mL | Freq: Once | INTRAVENOUS | Status: AC
  Administered 2012-08-10: 2000 mL via INTRAVENOUS

## 2012-08-10 MED ORDER — LACTATED RINGERS IV SOLN
INTRAVENOUS | Status: DC
  Administered 2012-08-10: 07:00:00 via INTRAVENOUS

## 2012-08-10 MED ORDER — CEFTRIAXONE SODIUM 1 G IJ SOLR: 1.0000 g | Freq: Two times a day (BID) | INTRAMUSCULAR | Status: DC

## 2012-08-10 MED ORDER — INSULIN ASPART 100 UNIT/ML ~~LOC~~ SOLN
1.0000 [IU] | SUBCUTANEOUS | Status: DC
  Administered 2012-08-12 – 2012-08-13 (×3): 1 [IU] via SUBCUTANEOUS

## 2012-08-10 MED ORDER — SODIUM CHLORIDE 0.9 % IV SOLN
25.0000 ug/h | INTRAVENOUS | Status: DC
  Administered 2012-08-10 – 2012-08-11 (×3): 100 ug/h via INTRAVENOUS
  Administered 2012-08-12 (×3): 50 ug/h via INTRAVENOUS
  Administered 2012-08-13: 150 ug/h via INTRAVENOUS
  Filled 2012-08-10 (×5): qty 50

## 2012-08-10 MED ORDER — DEXTROSE 5 % IV SOLN
500.0000 mg | INTRAVENOUS | Status: DC
  Administered 2012-08-10: 500 mg via INTRAVENOUS
  Filled 2012-08-10 (×2): qty 500

## 2012-08-10 MED ORDER — ASPIRIN 81 MG PO CHEW: 324.0000 mg | CHEWABLE_TABLET | ORAL | Status: AC

## 2012-08-10 MED ORDER — CHLORHEXIDINE GLUCONATE 0.12 % MT SOLN
OROMUCOSAL | Status: AC
  Administered 2012-08-10: 15 mL via OROMUCOSAL
  Filled 2012-08-10: qty 15

## 2012-08-10 MED ORDER — HEPARIN SODIUM (PORCINE) 5000 UNIT/ML IJ SOLN
5000.0000 [IU] | Freq: Three times a day (TID) | INTRAMUSCULAR | Status: DC
  Administered 2012-08-10 – 2012-08-18 (×26): 5000 [IU] via SUBCUTANEOUS
  Filled 2012-08-10 (×28): qty 1

## 2012-08-10 MED ORDER — ALBUTEROL SULFATE HFA 108 (90 BASE) MCG/ACT IN AERS: 8.0000 | INHALATION_SPRAY | RESPIRATORY_TRACT | Status: DC | PRN

## 2012-08-10 NOTE — Progress Notes (Signed)
CRITICAL VALUE ALERT  Critical value received:  Troponin 0.82  Date of notification:  08/10/2012  Time of notification:  6:00 PM  Critical value read back:yes  Nurse who received alert:  Vassie Moselle  MD notified (1st page):  Dr. De Burrs  Time of first page:  6:01 PM  MD notified (2nd page):  Time of second page:  Responding MD:  Dr. De Burrs  Time MD responded:  6:01 PM

## 2012-08-10 NOTE — H&P (Addendum)
PULMONARY  / CRITICAL CARE MEDICINE  Name: Brad Jones MRN: [id number] DOB: August 27, 1927    ADMISSION DATE:  08/09/2012 CONSULTATION DATE:  08/10/2012  REFERRING MD :  [facility] EDP PRIMARY SERVICE: PCCM  CHIEF COMPLAINT:  Acute respiratory failure  BRIEF PATIENT DESCRIPTION: 79 y/o male with HTN and COPD was brought in by EMS unresponsive in the setting of severe CAP requiring intubation.  Found to be in AKI and transferred to [facility] from [facility] on 7/12.  SIGNIFICANT EVENTS / STUDIES:  7/12 CT head> old infarct on R age indeterminate; no acute intracranial abnormality  LINES / TUBES: 7/12 ETT >>  CULTURES: 7/12 blood >>GPC ch> 7/13 resp>> 08/10/12 urine streop and legionella 08/10/12 Resp Virus Multiplex PCR >>  ANTIBIOTICS: 7/12 ceftriaxone >>7/13 7/12 azithro >> 7/13 Vanc >> 7/13 Zosyn >>  HISTORY OF PRESENT ILLNESS:  79 y/o male with HTN and COPD was brought in by EMS unresponsive in the setting of severe CAP requiring intubation.  Found to be in AKI and transferred to [facility] from [facility] ED on 7/12.  Apparently his family noted that he had been less responsive all day on 7/12 and was struggling with frothy secretions from his mouth. No further history could be obtained.  SUBJECTIVE:  Sedated on vent , follows simple commands  Pressor demands decreasing.   VITAL SIGNS: Temp:  [98.6 F (37 C)-100.7 F (38.2 C)] 99.5 F (37.5 C) (07/13 1000) Pulse Rate:  [74-115] 83 (07/13 1114) Resp:  [0-27] 18 (07/13 1114) BP: (58-167)/(38-109) 125/80 mmHg (07/13 1114) SpO2:  [58 %-100 %] 99 % (07/13 1114) FiO2 (%):  [40 %-100 %] 40 % (07/13 1114) Weight:  [220 lb (99.791 kg)] 220 lb (99.791 kg) (07/12 2031) HEMODYNAMICS:   VENTILATOR SETTINGS: Vent Mode:  [-] PRVC FiO2 (%):  [40 %-100 %] 40 % Set Rate:  [18 bmp-24 bmp] 18 bmp Vt Set:  [600 mL-650 mL] 600 mL PEEP:  [5 cmH20] 5 cmH20 Plateau Pressure:  [20 cmH20-25 cmH20] 22 cmH20 INTAKE / OUTPUT: Intake/Output     07/12 0701 - 07/13 0700 07/13  0701 - 07/14 0700   I.V. (mL/kg) 122.6 (1.2) 361.4 (3.6)   NG/GT 30    IV Piggyback  50   Total Intake(mL/kg) 152.6 (1.5) 411.4 (4.1)   Urine (mL/kg/hr) 550 225 (0.5)   Total Output 550 225   Net -397.4 +186.4          PHYSICAL EXAMINATION: General: VSS , ill appearing on vent   Head: sluggish pupils ,  Normal cephalic and atramatic.  No icterus or scleral edema  Lungs: Minimal rales at bilateral bases, notable mild rhonchi at right upper lung.  No wheezes, Heart: HRRR S1 S2 Radial and DP Pulses are 1+ & equal, no MRG No carotid bruit. No JVD. No abdominal bruits. No femoral bruits.  Abdomen: Bowel sounds are positive, abdomen soft and non-tender without masses, no hepatosplenomegaly.  No ecchymosis Extremities: +1 LE edema bilaterally, however No clubbing, cyanosis  Neuro: sedated , followed simple commands for RN   7/12 Bedside echo with boarderline dilated RV, otherwise intact EF.  No pericardial effusions, unable to appreciate valves with windows obtained  LABS: - see individual A and P  Recent Labs Lab 08/10/12 0051  GLUCAP 120*    7/13 CXR: Endotracheal tube in satisfactory position.  2. Mildly increased right upper lobe pneumonia.  3. Improved bibasilar atelectasis.   ASSESSMENT / PLAN:  PULMONARY   Recent Labs Lab 08/09/12 2020 08/09/12 2152 08/10/12  0210  PHART 7.191* 7.448 7.330*  PCO2ART 60.0* 28.2* 43.5  PO2ART 78.5* 144.0* 64.0*  HCO3 22.1 19.2* 22.7  TCO2 21.0 17.0 24  O2SAT 92.4 99.0 89.0     A:Acute respiratory due to severe CAP COPD> not in exacerbation P:   -full vent support -abg now -daily wua/sbt/cxr/abg -scheduled and prn albuterol -expand abx coverage w/ zosyn/vanc  -cont on BD   CARDIOVASCULAR   Recent Labs Lab 08/09/12 2000  TROPONINI <0.30    No results found for this basename: PROBNP,  in the last 168 hours  A: Hypotension; Septic Shock ?PNA ? Bacteremia  Hypertension - home rx on hold  7/13 >lactate clearing ,  troponin neg x 1   P:  --wean off dopamine, for MAP >65 -tr LA/PCT  -Source is Pneumonia; continue current antibiotics with azithromycin and ceftriaxone -continue fluid resuciatation -IVF at 100 LR  -check bnp in am  -hold on echo for now   RENAL  Recent Labs Lab 08/09/12 2000 08/10/12 0200  NA 144 141  K 4.7 4.1  CL 104 106  CO2 27 25  GLUCOSE 132* 131*  BUN 39* 45*  CREATININE 4.86* 4.36*  CALCIUM 9.6 9.0    A:  AKI? Uncertain baseline Cr (>2 however) P:   -foley in place -continue IVF resuscitation -repeat BMET in AM  GASTROINTESTINAL  A:  No acute issues P:   -Pantoprazole for stress ulcer prophylaxis   HEMATOLOGIC  Recent Labs Lab 08/09/12 2000 08/10/12 0200  HGB 13.4 12.4*  HCT 45.5 41.1  WBC 30.1* 19.2*  PLT 109* 94*    A:  No acute issues P:  -sub q hep for dvt prophylaxis  INFECTIOUS No results found for this basename: PROCALCITON,  in the last 168 hours  Recent Labs Lab 08/09/12 2000 08/10/12 0200  WBC 30.1* 19.2*    Recent Labs Lab 08/09/12 2019 08/10/12 0200 08/10/12 0818  LATICACIDVEN 6.6* 4.4* 2.6*   No results found for this basename: PROCALCITON,  in the last 168 hours  A:  Severe CAP 7/13 BC w/ GPC  P:   -follow cx data -tr wbc/ -Tr PCT/LA  - d/c rocephin -Add Vanc /Zosyn  -Cont Azithr   ENDOCRINE  Recent Labs Lab 08/10/12 0051  GLUCAP 120*    A:  DM -diet controlled  P:   -monitor glucose -add SSI   NEUROLOGIC A:  Acute encephalopathy due to severe sepsis and respiratory failure P:   -titrate sedation to RASS 0 -fentanyl gtt -prn versed    TODAY'S SUMMARY: 79 y/o male with severe CAP requiring mechanical ventilation; hypotension on low dose dopamine, likely under resuscitated; cont  IVF,  may need CVL  If not able to get off pressor to check cvp   PARRETT,TAMMY NP-C  Pulmonary and Critical Care Medicine [facility] Pager: [phone]  08/10/2012, 11:46 AM   STAFF NOTE: I, Dr  Lavinia Sharps have personally reviewed patient's available data, including medical history, events of note, physical examination and test results as part of my evaluation. I have discussed with resident/NP and other care providers such as pharmacist, RN and RRT.  In addition,  I personally evaluated patient and elicited key findings of septic shock with acute on chronic renal failure and acute respiratory failure secondary to gram positive cocci bacteremia with speciation pending. He does not have a central line but he is weaning off pressors. At this point I will aggressively hydrate him. I will change his lactated Ringer's to normal  saline. We'll check pro-CAL. In addition check urine Legionella and Streptococcus and respiratory virus PCR. If he gets worse he will need a central line and will need to change dopamine to levophed and cosnider stress steroids Rest per NP/medical resident whose note is outlined above and that I agree with  The patient is critically ill with multiple organ systems failure and requires high complexity decision making for assessment and support, frequent evaluation and titration of therapies, application of advanced monitoring technologies and extensive interpretation of multiple databases.   Critical Care Time devoted to patient care services described in this note is  31 Minutes independent of nurse practitioner time  Dr. Kalman Shan, M.D., [facility].C.P Pulmonary and Critical Care Medicine Staff Physician  System [place] [facility] Pager: [phone], If no answer or between  15:00h - 7:00h: call [phone]  08/10/2012 3:53 PM

## 2012-08-10 NOTE — Plan of Care (Signed)
Problem: Phase I Progression Outcomes Goal: Patient tolerating nututrition at goal Outcome: Progressing No nutrition at this time, pt NPO Goal: Voiding-avoid urinary catheter unless indicated Outcome: Not Met (add Reason) Patient has catheter at this time due to hemodynamic instability during 1st 24 hours

## 2012-08-10 NOTE — Progress Notes (Signed)
ANTIBIOTIC CONSULT NOTE - INITIAL  Pharmacy Consult for Vancomycin, Zosyn Indication: pneumonia, positive blood cultures  No Known Allergies  Patient Measurements: Height: 5\' 8"  (172.7 cm) Weight: 220 lb (99.791 kg) IBW/kg (Calculated) : 68.4 Adjusted Body Weight: n/a  Vital Signs: Temp: 99 F (37.2 C) (07/13 1200) BP: 101/63 mmHg (07/13 1200) Pulse Rate: 83 (07/13 1200) Intake/Output from previous day: 07/12 0701 - 07/13 0700 In: 152.6 [I.V.:122.6; NG/GT:30] Out: 550 [Urine:550] Intake/Output from this shift: Total I/O In: 644.5 [I.V.:594.5; IV Piggyback:50] Out: 275 [Urine:275]  Labs:  Recent Labs  08/09/12 2000 08/10/12 0200  WBC 30.1* 19.2*  HGB 13.4 12.4*  PLT 109* 94*  CREATININE 4.86* 4.36*   Estimated Creatinine Clearance: 15 ml/min (by C-G formula based on Cr of 4.36). No results found for this basename: VANCOTROUGH, Leodis Binet, VANCORANDOM, GENTTROUGH, GENTPEAK, GENTRANDOM, TOBRATROUGH, TOBRAPEAK, TOBRARND, AMIKACINPEAK, AMIKACINTROU, AMIKACIN,  in the last 72 hours   Microbiology: Recent Results (from the past 720 hour(s))  CULTURE, BLOOD (ROUTINE X 2)     Status: None   Collection Time    08/09/12  8:19 PM      Result Value Range Status   Specimen Description LEFT ANTECUBITAL   Final   Special Requests BOTTLES DRAWN AEROBIC AND ANAEROBIC 8CC   Final   Culture  Setup Time     Final   Value: GRAM POSITIVE COCCI IN CHAINS     Gram Stain Report Called to,Read Back By and Verified With: BECK A. AT [facility] ON [id number] AT 0945A BY THOMPSON S.   Culture PENDING   Incomplete   Report Status PENDING   Incomplete  CULTURE, BLOOD (ROUTINE X 2)     Status: None   Collection Time    08/09/12  8:28 PM      Result Value Range Status   Specimen Description BLOOD RIGHT WRIST   Final   Special Requests BOTTLES DRAWN AEROBIC AND ANAEROBIC 8CC   Final   Culture  Setup Time     Final   Value: GRAM POSITIVE COCCI IN CHAINS     Gram Stain Report Called to,Read Back By and  Verified With: BECK A. AT [facility] [place] [id number] BY THOMPSON S.   Culture PENDING   Incomplete   Report Status PENDING   Incomplete  MRSA PCR SCREENING     Status: Abnormal   Collection Time    08/10/12 12:53 AM      Result Value Range Status   MRSA by PCR POSITIVE (*) NEGATIVE Final   Comment:            The GeneXpert MRSA Assay (FDA     approved for NASAL specimens     only), is one component of a     comprehensive MRSA colonization     surveillance program. It is not     intended to diagnose MRSA     infection nor to guide or     monitor treatment for     MRSA infections.     RESULT CALLED TO, READ BACK BY AND VERIFIED WITH:     CLAUDIO,J RN [phone] 08/10/12 MITCHELL,L    Medical History: Past Medical History  Diagnosis Date  . Hypertension   . CHF (congestive heart failure)   . COPD (chronic obstructive pulmonary disease)   . Emphysema   . Diabetes mellitus without complication     non insulin dependent  . Cancer     prostate  . Renal disorder kidneys only function at 20%  pt does not want dialysis  . Stroke     TIA, multiple    Medications:  Scheduled:  . albuterol  8 puff Inhalation Q6H  . antiseptic oral rinse  15 mL Mouth Rinse QID  . azithromycin  500 mg Intravenous Q24H  . chlorhexidine  15 mL Mouth Rinse BID  . heparin  5,000 Units Subcutaneous Q8H  . insulin aspart  1-3 Units Subcutaneous Q4H  . pantoprazole (PROTONIX) IV  40 mg Intravenous QHS  . piperacillin-tazobactam (ZOSYN)  IV  2.25 g Intravenous Q6H  . vancomycin  1,500 mg Intravenous Q48H   Assessment: 79 yo male admitted from home with severe CAP requiring intubation.  Blood cultures revealed GPC in chains, likely strep species although not yet confirmed.  Pharmacy asked to broaden antibiotic coverage to include MRSA since MRSA screen positive.  Scr elevated, estimated CrCl ~ 15 ml/min.  Goal of Therapy:  Vancomycin trough level 15-20 mcg/ml  Plan:  1. Vancomycin 1500 mg IV q 48 hrs. 2. Zosyn 2.25g  IV q 6 hrs. 3. Will monitor renal function, cultures and clinical course. 4. Vancomycin trough at steady state. 5. Consider narrowing of antibiotics as cultures return.  Tad Moore, BCPS  Clinical Pharmacist Pager [phone]  08/10/2012 12:18 PM

## 2012-08-10 NOTE — Progress Notes (Signed)
Pt on the way VIA [facility]

## 2012-08-10 NOTE — Progress Notes (Signed)
CRITICAL VALUE ALERT  Critical value received:  + blood cultures, gram+cocci and chains (both sets)  Date of notification:  08/10/2012  Time of notification:  9:46 AM  Critical value read back:yes  Nurse who received alert:  Vassie Moselle  MD notified (1st page):  Ramaswamy  Time of first page:  9:46 AM  MD notified (2nd page):  Time of second page:  Responding MD:  Lorna Few  Time MD responded:  [phone]

## 2012-08-10 NOTE — Progress Notes (Signed)
CRITICAL VALUE ALERT  Critical value received:  MRSA+  Date of notification:  08/10/2012  Time of notification: 0500  Critical value read back:yes  Nurse who received alert:  Sherle Poe  MD notified (1st page): McQuaid  Time of first page:  0500  MD notified (2nd page):  Time of second page:  Responding MD:  Kendrick Fries  Time MD responded:  0500

## 2012-08-10 NOTE — H&P (Signed)
PULMONARY  / CRITICAL CARE MEDICINE  Name: Brad Jones MRN: [id number] DOB: [birth year]    ADMISSION DATE:  08/09/2012 CONSULTATION DATE:  08/10/2012  REFERRING MD :  [facility] EDP PRIMARY SERVICE: PCCM  CHIEF COMPLAINT:  Acute respiratory failure  BRIEF PATIENT DESCRIPTION: 79 y/o male with HTN and COPD was brought in by EMS unresponsive in the setting of severe CAP requiring intubation.  Found to be in AKI and transferred to [facility] from [facility] ED on 7/12.  SIGNIFICANT EVENTS / STUDIES:  7/12 CT head> old infarct on R age indeterminate; no acute intracranial abnormality  LINES / TUBES: 7/12 ETT >>  CULTURES: 7/12 blood >> 7/13 resp>>  ANTIBIOTICS: 7/12 ceftriaxone >> 7/12 azithro >>  HISTORY OF PRESENT ILLNESS:  79 y/o male with HTN and COPD was brought in by EMS unresponsive in the setting of severe CAP requiring intubation.  Found to be in AKI and transferred to [facility] from [facility] ED on 7/12.  Apparently his family noted that he had been less responsive all day on 7/12 and was struggling with frothy secretions from his mouth. No further history could be obtained.   PAST MEDICAL HISTORY :  Past Medical History  Diagnosis Date  . Hypertension   . CHF (congestive heart failure)   . COPD (chronic obstructive pulmonary disease)   . Emphysema   . Diabetes mellitus without complication     non insulin dependent  . Cancer     prostate  . Renal disorder kidneys only function at 20%    pt does not want dialysis  . Stroke     TIA, multiple   Past Surgical History  Procedure Laterality Date  . Prostatectomy     Prior to Admission medications   Medication Sig Start Date End Date Taking? Authorizing Provider  albuterol (PROVENTIL HFA;VENTOLIN HFA) 108 (90 BASE) MCG/ACT inhaler Inhale 2 puffs into the lungs every 6 (six) hours as needed for wheezing.   Yes Historical Provider, MD  aspirin 81 MG tablet Take 81 mg by mouth daily.   Yes Historical Provider, MD  calcitRIOL (ROCALTROL) 0.25 MCG capsule  Take 0.25 mcg by mouth daily.   Yes Historical Provider, MD  docusate sodium (COLACE) 50 MG capsule Take by mouth at bedtime.   Yes Historical Provider, MD  fluocinonide ointment (LIDEX) 0.05 % Apply topically 2 (two) times daily.   Yes Historical Provider, MD  furosemide (LASIX) 40 MG tablet Take 40 mg by mouth daily.   Yes Historical Provider, MD  HYDROcodone-acetaminophen (NORCO/VICODIN) 5-325 MG per tablet Take 1 tablet by mouth every 6 (six) hours as needed for pain.   Yes Historical Provider, MD  metoprolol (TOPROL-XL) 200 MG 24 hr tablet Take 200 mg by mouth daily.   Yes Historical Provider, MD  nitroGLYCERIN (NITROSTAT) 0.3 MG SL tablet Place 0.3 mg under the tongue every 5 (five) minutes as needed for chest pain.   Yes Historical Provider, MD  terazosin (HYTRIN) 2 MG capsule Take 2 mg by mouth at bedtime.   Yes Historical Provider, MD  tiotropium (SPIRIVA) 18 MCG inhalation capsule Place 18 mcg into inhaler and inhale daily.   Yes Historical Provider, MD   No Known Allergies  FAMILY HISTORY:  History reviewed. No pertinent family history. SOCIAL HISTORY:  reports that he has quit smoking. He does not have any smokeless tobacco history on file. He reports that he does not drink alcohol. His drug history is not on file.  REVIEW OF SYSTEMS:  Cannot obtain due  to intubation  SUBJECTIVE:   VITAL SIGNS: Temp:  [98.6 F (37 C)-99.8 F (37.7 C)] 99.8 F (37.7 C) (07/12 2330) Pulse Rate:  [74-115] 96 (07/12 2330) Resp:  [15-27] 19 (07/12 2330) BP: (58-167)/(38-109) 121/75 mmHg (07/12 2330) SpO2:  [58 %-100 %] 100 % (07/12 2330) FiO2 (%):  [40 %-100 %] 40 % (07/13 0049) Weight:  [99.791 kg (220 lb)] 99.791 kg (220 lb) (07/12 2031) HEMODYNAMICS:   VENTILATOR SETTINGS: Vent Mode:  [-] PRVC FiO2 (%):  [40 %-100 %] 40 % Set Rate:  [20 bmp-24 bmp] 20 bmp Vt Set:  [600 mL-650 mL] 600 mL PEEP:  [5 cmH20] 5 cmH20 Plateau Pressure:  [23 cmH20-25 cmH20] 23 cmH20 INTAKE /  OUTPUT: Intake/Output     07/12 0701 - 07/13 0700   Urine (mL/kg/hr) 250   Total Output 250   Net -250         PHYSICAL EXAMINATION: General: VSS on dopamine Well developed, well nourished, unresponsive, elderly male on mechanical ventilation  Head: Eyes PERRL but sluggish, No xanthomas. Normal cephalic and atramatic.  No icterus or scleral edema  Lungs: Minimal rales at bilateral bases, notable mild rhonchi at right upper lung.  No wheezes, good air movment  Heart: HRRR S1 S2 Radial and DP Pulses are 2+ & equal, no MRG No carotid bruit. No JVD. No abdominal bruits. No femoral bruits.  Abdomen: Bowel sounds are positive, abdomen soft and non-tender without masses, no hepatosplenomegaly.  No ecchymosis Extremities: +1 LE edema bilaterally, however No clubbing, cyanosis  Neuro: Unresponsive, 7-t GCS  Bedside echo with boarderline dilated RV, otherwise intact EF.  No pericardial effusions, unable to appreciate valves with windows obtained  LABS:  Recent Labs Lab 08/09/12 2000 08/09/12 2019 08/09/12 2020 08/09/12 2152  HGB 13.4  --   --   --   WBC 30.1*  --   --   --   PLT 109*  --   --   --   NA 144  --   --   --   K 4.7  --   --   --   CL 104  --   --   --   CO2 27  --   --   --   GLUCOSE 132*  --   --   --   BUN 39*  --   --   --   CREATININE 4.86*  --   --   --   CALCIUM 9.6  --   --   --   AST 10  --   --   --   ALT 6  --   --   --   ALKPHOS 58  --   --   --   BILITOT 0.5  --   --   --   PROT 7.0  --   --   --   ALBUMIN 2.9*  --   --   --   LATICACIDVEN  --  6.6*  --   --   TROPONINI <0.30  --   --   --   PHART  --   --  7.191* 7.448  PCO2ART  --   --  60.0* 28.2*  PO2ART  --   --  78.5* 144.0*   No results found for this basename: GLUCAP,  in the last 168 hours  CXR: RML and RUL pneumonia, ETT in place  ASSESSMENT / PLAN:  PULMONARY A:Acute respiratory due to severe CAP COPD> not in  exacerbation P:   -full vent support -abg now -daily  wua/sbt/cxr/abg -scheduled and prn albuterol  CARDIOVASCULAR A: Hypotension; clearly septic but not clearly septic shock as appears under resuscitated Hypertension at baseline P:  -NS bolused for a total of 3 liters (outside and here); will start LR at 100cc/hr for 1 liter.  IVC not dilated, but minimal variation with respiration (patient with noted dilated RA) -wean off dopamine, current MAP 85 -if no improvement in BP, then place CVL -tele -12 lead now -lactic acid Q4 -Source is Pneumonia; continue current antibiotics with azithromycin and ceftriaxone  RENAL A:  AKI? Uncertain baseline Cr (>2 however) P:   -foley in place -continue IVF resuscitation -Hemodynamics improving with titration off of dopamine -repeat BMET in AM  GASTROINTESTINAL A:  No acute issues P:   -Pantoprazole for stress ulcer prophylaxis   HEMATOLOGIC A:  No acute issues P:  -sub q hep for dvt prophylaxis  INFECTIOUS A:  Severe CAP P:   -ceftriaxone/azithro -respiratory/blood culture  ENDOCRINE A:  No acute issues P:   -monitor glucose  NEUROLOGIC A:  Acute encephalopathy due to severe sepsis and respiratory failure P:   -titrate sedation to RASS 0 -fentanyl gtt -prn versed -frequent orientation, etc  TODAY'S SUMMARY: 79 y/o male with severe CAP requiring mechanical ventilation; hypotension on low dose dopamine, likely under resuscitated; plan IVF, abg, may need CVL  I have personally obtained a history, examined the patient, evaluated laboratory and imaging results, formulated the assessment and plan and placed orders. CRITICAL CARE: The patient is critically ill with multiple organ systems failure and requires high complexity decision making for assessment and support, frequent evaluation and titration of therapies, application of advanced monitoring technologies and extensive interpretation of multiple databases. Critical Care Time devoted to patient care services described in this note  is 65 minutes.    Pulmonary and Critical Care Medicine [facility] Pager: [phone]  08/10/2012, 1:10 AM

## 2012-08-11 ENCOUNTER — Inpatient Hospital Stay (HOSPITAL_COMMUNITY): Payer: Medicare Other

## 2012-08-11 DIAGNOSIS — J9600 Acute respiratory failure, unspecified whether with hypoxia or hypercapnia: Secondary | ICD-10-CM

## 2012-08-11 DIAGNOSIS — N179 Acute kidney failure, unspecified: Secondary | ICD-10-CM

## 2012-08-11 DIAGNOSIS — J189 Pneumonia, unspecified organism: Secondary | ICD-10-CM

## 2012-08-11 DIAGNOSIS — A419 Sepsis, unspecified organism: Secondary | ICD-10-CM

## 2012-08-11 LAB — COMPREHENSIVE METABOLIC PANEL
BUN: 61 mg/dL — ABNORMAL HIGH (ref 6–23)
CO2: 25 mEq/L (ref 19–32)
Calcium: 8.6 mg/dL (ref 8.4–10.5)
Creatinine, Ser: 4.47 mg/dL — ABNORMAL HIGH (ref 0.50–1.35)
GFR calc Af Amer: 13 mL/min — ABNORMAL LOW (ref >90)
GFR calc non Af Amer: 11 mL/min — ABNORMAL LOW (ref >90)
Glucose, Bld: 81 mg/dL (ref 70–99)
Sodium: 142 mEq/L (ref 135–145)
Total Protein: 5.9 g/dL — ABNORMAL LOW (ref 6.0–8.3)

## 2012-08-11 LAB — CBC
Hemoglobin: 12 g/dL — ABNORMAL LOW (ref 13.0–17.0)
MCH: 25.5 pg — ABNORMAL LOW (ref 26.0–34.0)
MCHC: 32 g/dL (ref 30.0–36.0)
MCV: 79.8 fL (ref 78.0–100.0)
RBC: 4.7 MIL/uL (ref 4.22–5.81)

## 2012-08-11 LAB — BLOOD GAS, ARTERIAL
Acid-base deficit: 1.7 mmol/L (ref 0.0–2.0)
Bicarbonate: 23.2 mEq/L (ref 20.0–24.0)
Drawn by: 36277
FIO2: 0.4 %
O2 Saturation: 90.2 %
RATE: 18 resp/min
TCO2: 24.6 mmol/L (ref 0–100)
pCO2 arterial: 43.9 mmHg (ref 35.0–45.0)
pO2, Arterial: 63.3 mmHg — ABNORMAL LOW (ref 80.0–100.0)

## 2012-08-11 LAB — APTT: aPTT: 37 seconds (ref 24–37)

## 2012-08-11 LAB — URINE CULTURE
Colony Count: NO GROWTH
Culture: NO GROWTH
Culture: NO GROWTH

## 2012-08-11 LAB — PRO B NATRIURETIC PEPTIDE: Pro B Natriuretic peptide (BNP): 10756 pg/mL — ABNORMAL HIGH (ref 0–450)

## 2012-08-11 LAB — GLUCOSE, CAPILLARY
Glucose-Capillary: 70 mg/dL (ref 70–99)
Glucose-Capillary: 74 mg/dL (ref 70–99)
Glucose-Capillary: 83 mg/dL (ref 70–99)
Glucose-Capillary: 101 mg/dL — ABNORMAL HIGH (ref 70–99)

## 2012-08-11 LAB — PHOSPHORUS: Phosphorus: 3.1 mg/dL (ref 2.3–4.6)

## 2012-08-11 LAB — MAGNESIUM: Magnesium: 1.6 mg/dL (ref 1.5–2.5)

## 2012-08-11 LAB — CORTISOL: Cortisol, Plasma: 30.5 ug/dL

## 2012-08-11 LAB — PROCALCITONIN: Procalcitonin: 112.01 ng/mL

## 2012-08-11 MED ORDER — ETOMIDATE 2 MG/ML IV SOLN: 20.0000 mg | Freq: Once | INTRAVENOUS | Status: AC

## 2012-08-11 MED ORDER — ASPIRIN 81 MG PO CHEW
81.0000 mg | CHEWABLE_TABLET | Freq: Every day | ORAL | Status: DC
  Administered 2012-08-11 – 2012-08-18 (×8): 81 mg
  Filled 2012-08-11 (×8): qty 1

## 2012-08-11 MED ORDER — VITAL AF 1.2 CAL PO LIQD
1000.0000 mL | ORAL | Status: DC
  Administered 2012-08-11: 711 mL
  Filled 2012-08-11 (×4): qty 1000

## 2012-08-11 MED ORDER — MUPIROCIN 2 % EX OINT
1.0000 "application " | TOPICAL_OINTMENT | Freq: Two times a day (BID) | CUTANEOUS | Status: AC
  Administered 2012-08-11 – 2012-08-16 (×10): 1 via NASAL
  Filled 2012-08-11 (×2): qty 22

## 2012-08-11 MED ORDER — ETOMIDATE 2 MG/ML IV SOLN
INTRAVENOUS | Status: AC
  Administered 2012-08-11: 20 mg via INTRAVENOUS
  Filled 2012-08-11: qty 10

## 2012-08-11 MED ORDER — CHLORHEXIDINE GLUCONATE CLOTH 2 % EX PADS
6.0000 | MEDICATED_PAD | Freq: Every day | CUTANEOUS | Status: AC
Start: 2012-08-11 — End: 2012-08-15
  Administered 2012-08-11 – 2012-08-15 (×5): 6 via TOPICAL

## 2012-08-11 NOTE — Procedures (Signed)
[place] rt chest  1. Small effusion with a pocket at 7th rib space  2. Dense lung with holes, atx? Fibrinous material? Collapse?  Will need Ct chest assessment  Mcarthur Rossetti. [name], MD, FACP Pgr: [phone] [place] [facility]

## 2012-08-11 NOTE — Progress Notes (Signed)
  Echocardiogram 2D Echocardiogram has been performed.  Darly Massi 08/11/2012, 10:51 AM

## 2012-08-11 NOTE — Progress Notes (Signed)
Pt was placed on wean at 0809 at CPAP 5/PS 5. At 0847 pt had increased hr to the 140s and was agitated and respirations increased. Pt was placed back on full support at this time after trying to increase the PS and trying to talk him down. Full support helped. RT will continue to monitor.

## 2012-08-11 NOTE — Care Management Note (Signed)
    Page 1 of 1   08/18/2012     1:22:28 PM   CARE MANAGEMENT NOTE 08/18/2012  Patient:  Brad Jones, Brad Jones   Account Number:  [id number]  Date Initiated:  08/11/2012  Documentation initiated by:  Avie Arenas  Subjective/Objective Assessment:   Resp failure - requiring intubation.     Action/Plan:   pt eval- rec CIR   Anticipated DC Date:  08/18/2012   Anticipated DC Plan:  IP REHAB FACILITY      DC Planning Services  CM consult      PAC Choice  IP REHAB   Choice offered to / List presented to:  C-1 Patient           Status of service:  Completed, signed off Medicare Important Message given?   (If response is "NO", the following Medicare IM given date fields will be blank) Date Medicare IM given:   Date Additional Medicare IM given:    Discharge Disposition:  IP REHAB FACILITY  Per UR Regulation:  Reviewed for med. necessity/level of care/duration of stay  If discussed at Long Length of Stay Meetings, dates discussed:    Comments:  Contact:  Deroos,Anita Spouse [phone]  08/18/12 13:21 Letha Cape RN, BSN [phone] patient is dc to CIR today.  08-13-12 - 1:45pm Avie Arenas, RNBSN [phone] Patient remains intubated - ?? extubation on 7-17. Indpendent prior - Had [facility] discussion with physician - feel at this point will be too good to go to [facility] - ??? CM will continue to follow.

## 2012-08-11 NOTE — Procedures (Signed)
Central Venous Catheter Insertion Procedure Note Brad Jones [id number] 28-Apr-1927  Procedure: Insertion of Central Venous Catheter Indications: Assessment of intravascular volume, Drug and/or fluid administration and Frequent blood sampling  Procedure Details Consent: Risks of procedure as well as the alternatives and risks of each were explained to the (patient/caregiver).  Consent for procedure obtained. Time Out: Verified patient identification, verified procedure, site/side was marked, verified correct patient position, special equipment/implants available, medications/allergies/relevent history reviewed, required imaging and test results available.  Performed  Maximum sterile technique was used including antiseptics, cap, gloves, gown, hand hygiene, mask and sheet. Skin prep: Chlorhexidine; local anesthetic administered A antimicrobial bonded/coated triple lumen catheter was placed in the right internal jugular vein using the Seldinger technique.  Evaluation Blood flow good Complications: No apparent complications Patient did tolerate procedure well. Chest X-ray ordered to verify placement.  CXR: pending.  Brad Jones 08/11/2012, 4:39 PM  US guidance performed with  Brad Jones. [name], MD, FACP Pgr: [phone] [place] [facility]

## 2012-08-11 NOTE — Progress Notes (Signed)
Patient transported to and from CT on vent.  No apparent complications during trip.

## 2012-08-11 NOTE — Progress Notes (Signed)
PULMONARY  / CRITICAL CARE MEDICINE  Name: Brad Jones MRN: [id number] DOB: August 16, 1927    ADMISSION DATE:  08/09/2012 CONSULTATION DATE:  08/10/2012  REFERRING MD :  [facility] EDP PRIMARY SERVICE: PCCM  CHIEF COMPLAINT:  Acute respiratory failure  BRIEF PATIENT DESCRIPTION: 79 y/o male with HTN and COPD was brought in by EMS unresponsive in the setting of severe CAP requiring intubation. Found to be in AKI and transferred to [facility] from [facility] ED on 7/12.  SIGNIFICANT EVENTS / STUDIES:  7/12 CT head> old infarct on R age indeterminate; no acute intracranial abnormality  LINES / TUBES: ETT 7/12 >>>   CULTURES: 7/12 blood >>> GPC chains >>> 7/13 resp >>>  7/13 urine strep >>> Positive 7/13 Resp Virus Multiplex PCR >>> 7/13 urine legionella >>>  ANTIBIOTICS: 7/12 ceftriaxone >>7/13  7/12 azithro >> 7/14 7/13 Vanc >>>  7/13 Zosyn >>>  SUBJECTIVE: intubated on vent  VITAL SIGNS: Temp:  [97.6 F (36.4 C)-99.8 F (37.7 C)] 98.1 F (36.7 C) (07/14 0400) Pulse Rate:  [73-92] 79 (07/14 0400) Resp:  [0-23] 3 (07/14 0400) BP: (75-141)/(47-90) 117/68 mmHg (07/14 0400) SpO2:  [87 %-100 %] 98 % (07/14 0400) FiO2 (%):  [40 %] 40 % (07/14 0400) Weight:  [209 lb 3.5 oz (94.9 kg)] 209 lb 3.5 oz (94.9 kg) (07/14 0400) HEMODYNAMICS:   VENTILATOR SETTINGS: Vent Mode:  [-] PRVC FiO2 (%):  [40 %] 40 % Set Rate:  [18 bmp] 18 bmp Vt Set:  [600 mL-600600 mL] 600 mL PEEP:  [5 cmH20] 5 cmH20 Plateau Pressure:  [17 cmH20-23 cmH20] 17 cmH20 INTAKE / OUTPUT: Intake/Output     07/13 0701 - 07/14 0700 07/14 0701 - 07/15 0700   I.V. (mL/kg) 2391.2 (25.2)    NG/GT     IV Piggyback 2550    Total Intake(mL/kg) 4941.2 (52.1)    Urine (mL/kg/hr) 870 (0.4)    Emesis/NG output 50 (0)    Total Output 920     Net +4021.2            PHYSICAL EXAMINATION: General:  NAD Neuro:  Eyes open, follows commands HEENT:  ETT in place Cardiovascular:  RRR Lungs:  CTAB, bilat breath sounds present Abdomen:   Moderately distended but nontender to palpation Musculoskeletal:  Ext atraumatic Skin:  2+ pitting edema bilat calves  LABS:  Recent Labs Lab 08/09/12 2000  08/09/12 2152 08/10/12 0200 08/10/12 0210 08/10/12 0818 08/10/12 1225 08/10/12 1714 08/10/12 1716 08/10/12 2212 08/10/12 2214 08/11/12 0152 08/11/12 0340 08/11/12 0400 08/11/12 0414  HGB 13.4  --   --  12.4*  --   --   --   --   --   --   --   --   --  12.0*  --   WBC 30.1*  --   --  19.2*  --   --   --   --   --   --   --   --   --  16.7*  --   PLT 109*  --   --  94*  --   --   --   --   --   --   --   --   --  97*  --   NA 144  --   --  141  --   --   --   --   --   --   --   --   --  142  --   K  4.7  --   --  4.1  --   --   --   --   --   --   --   --   --  4.9  --   CL 104  --   --  106  --   --   --   --   --   --   --   --   --  109  --   CO2 27  --   --  25  --   --   --   --   --   --   --   --   --  25  --   GLUCOSE 132*  --   --  131*  --   --   --   --   --   --   --   --   --  81  --   BUN 39*  --   --  45*  --   --   --   --   --   --   --   --   --  61*  --   CREATININE 4.86*  --   --  4.36*  --   --   --   --   --   --   --   --   --  4.47*  --   CALCIUM 9.6  --   --  9.0  --   --   --   --   --   --   --   --   --  8.6  --   MG  --   --   --   --   --   --   --   --   --   --   --   --   --  1.6  --   PHOS  --   --   --   --   --   --   --   --   --   --   --   --   --  3.1  --   AST 10  --   --   --   --   --   --   --   --   --   --   --   --  13  --   ALT 6  --   --   --   --   --   --   --   --   --   --   --   --  8  --   ALKPHOS 58  --   --   --   --   --   --   --   --   --   --   --   --  48  --   BILITOT 0.5  --   --   --   --   --   --   --   --   --   --   --   --  0.5  --   PROT 7.0  --   --   --   --   --   --   --   --   --   --   --   --  5.9*  --   ALBUMIN 2.9*  --   --   --   --   --   --   --   --   --   --   --   --  2.1*  --   LATICACIDVEN  --   < >  --  4.4*  --  2.6*  --  1.4  --   --   1.3 1.1  --   --   --   TROPONINI <0.30  --   --   --   --   --   --   --  0.82* 0.76*  --   --   --   --   --   PROCALCITON  --   --   --   --   --   --   --   --  117.71  --   --   --   --   --  112.01  PROBNP  --   --   --   --   --   --  23747.0*  --   --   --   --   --   --   --  10756.0*  PHART  --   < > 7.448  --  7.330*  --   --   --   --   --   --   --  7.343*  --   --   PCO2ART  --   < > 28.2*  --  43.5  --   --   --   --   --   --   --  43.9  --   --   PO2ART  --   < > 144.0*  --  64.0*  --   --   --   --   --   --   --  63.3*  --   --   < > = values in this interval not displayed.  Recent Labs Lab 08/10/12 1251 08/10/12 1530 08/10/12 1957 08/11/12 0015 08/11/12 0421  GLUCAP 85 82 70 79 70    CXR: RLL opacity, ETT in good position  ASSESSMENT / PLAN:  PULMONARY A:Acute respiratory failure due to severe CAP  COPD R/o effusion>? P:  -full vent support, he is 5'8'', reduce TV to 550, rate to 22 -abg reviewed, may need to 50% attempt SBP, cpap5 ps 5-10 goal 2 hrs -will [place] chest and consider tap if fluid noted, see cvs -even to pos balance goals  CARDIOVASCULAR A: Hypotension; Septic Shock Bacteremia  Hypertension - home rx on hold  Elevated lactate, downtrending Elevated troponin ProBNP markedly elevated Hx CHF P:  -pressors dopamine restarted, will place line and use levophed -will [place] chest, see ID -assess cortisol level - 30, no role steroids -continue fluid resuciatation -IVF NS at 100 -check echo, EKG now -add daily aspirin -cvp required  RENAL A: Acute on chronic kidney disease (baseline unclear, but at least 2) P:  -foley in place  -continue IVF resuscitation -repeat BMET in AM -check renal u/s today -cvp needed, then guide  GASTROINTESTINAL A: sepsis P:  -Pantoprazole for stress ulcer prophylaxis -Start TF Now in sepsis  HEMATOLOGIC A: Thrombocytopenia, resolving leukocytosis P:  -sub q hep for dvt prophylaxis -trend  CBC  INFECTIOUS A: Severe CAP  7/13 BC w/ GPC in chains - strep likely Urine strep antigen positive P:  -continue zosyn, vanc until strep sens , isolated Dc azithro -[place] chest, may need Ct as pressors restarted  ENDOCRINE A: DM -diet controlled  P:  -monitor glucose  -add SSI   NEUROLOGIC A: Acute encephalopathy due to severe sepsis and respiratory failure  P:  -titrate sedation to  RASS 0  -fentanyl gtt  -prn versed, avoid if able  TODAY'S SUMMARY: failed weaning, place line, get cvp,check echo, check EKG, renal u/s, will [place] chest  Levert Feinstein, MD Family Medicine PGY-2   I have personally obtained a history, examined the patient, evaluated laboratory and imaging results, formulated the assessment and plan and placed orders. CRITICAL CARE: The patient is critically ill with multiple organ systems failure and requires high complexity decision making for assessment and support, frequent evaluation and titration of therapies, application of advanced monitoring technologies and extensive interpretation of multiple databases. Critical Care Time devoted to patient care services described in this note is 30 minutes.   Mcarthur Rossetti. [name], MD, FACP Pgr: [phone] [place] [facility]  Pulmonary and Critical Care Medicine [facility] Pager: [phone]  08/11/2012, 7:40 AM

## 2012-08-12 ENCOUNTER — Inpatient Hospital Stay (HOSPITAL_COMMUNITY): Payer: Medicare Other

## 2012-08-12 LAB — BASIC METABOLIC PANEL
CO2: 23 mEq/L (ref 19–32)
Chloride: 111 mEq/L (ref 96–112)
Creatinine, Ser: 4.48 mg/dL — ABNORMAL HIGH (ref 0.50–1.35)
GFR calc Af Amer: 13 mL/min — ABNORMAL LOW (ref >90)
Potassium: 3.8 mEq/L (ref 3.5–5.1)
Sodium: 144 mEq/L (ref 135–145)

## 2012-08-12 LAB — POCT I-STAT 3, ART BLOOD GAS (G3+)
pCO2 arterial: 65.2 mmHg (ref 35.0–45.0)
pH, Arterial: 7.163 — CL (ref 7.350–7.450)
pO2, Arterial: 79 mmHg — ABNORMAL LOW (ref 80.0–100.0)

## 2012-08-12 LAB — GLUCOSE, CAPILLARY
Glucose-Capillary: 125 mg/dL — ABNORMAL HIGH (ref 70–99)
Glucose-Capillary: 95 mg/dL (ref 70–99)
Glucose-Capillary: 97 mg/dL (ref 70–99)

## 2012-08-12 LAB — CULTURE, RESPIRATORY W GRAM STAIN: Special Requests: NORMAL

## 2012-08-12 LAB — CBC
HCT: 31.3 % — ABNORMAL LOW (ref 39.0–52.0)
Hemoglobin: 10.1 g/dL — ABNORMAL LOW (ref 13.0–17.0)
MCV: 77.9 fL — ABNORMAL LOW (ref 78.0–100.0)
RBC: 4.02 MIL/uL — ABNORMAL LOW (ref 4.22–5.81)
RDW: 15.3 % (ref 11.5–15.5)
WBC: 11.7 10*3/uL — ABNORMAL HIGH (ref 4.0–10.5)

## 2012-08-12 LAB — CULTURE, BLOOD (ROUTINE X 2)

## 2012-08-12 LAB — PROCALCITONIN: Procalcitonin: 76.15 ng/mL

## 2012-08-12 LAB — LEGIONELLA ANTIGEN, URINE: Legionella Antigen, Urine: NEGATIVE

## 2012-08-12 MED ORDER — ADULT MULTIVITAMIN W/MINERALS CH
1.0000 | ORAL_TABLET | Freq: Every day | ORAL | Status: DC
  Administered 2012-08-12 – 2012-08-18 (×7): 1
  Filled 2012-08-12 (×7): qty 1

## 2012-08-12 MED ORDER — PROMOTE PO LIQD
1000.0000 mL | ORAL | Status: DC
  Administered 2012-08-12 – 2012-08-13 (×2): 1000 mL
  Filled 2012-08-12 (×4): qty 1000

## 2012-08-12 MED ORDER — PNEUMOCOCCAL VAC POLYVALENT 25 MCG/0.5ML IJ INJ
0.5000 mL | INJECTION | INTRAMUSCULAR | Status: AC
  Administered 2012-08-13: 0.5 mL via INTRAMUSCULAR
  Filled 2012-08-12: qty 0.5

## 2012-08-12 MED ORDER — PRO-STAT SUGAR FREE PO LIQD
60.0000 mL | Freq: Four times a day (QID) | ORAL | Status: DC
  Administered 2012-08-12 – 2012-08-14 (×8): 60 mL
  Filled 2012-08-12 (×11): qty 60

## 2012-08-12 NOTE — Plan of Care (Signed)
Problem: Phase I Progression Outcomes Goal: Voiding-avoid urinary catheter unless indicated Outcome: Not Applicable Date Met:  08/12/12 Pt has foley

## 2012-08-12 NOTE — Progress Notes (Signed)
eLink Physician-Brief Progress Note Patient Name: Brad Jones DOB: 12/08/1927 MRN: [id number]  Date of Service  08/12/2012   HPI/Events of Note   Ct Chest Wo Contrast  08/12/2012   *RADIOLOGY REPORT*  Clinical Data: Question loculated pneumonia seen on bedside ultrasound.  CT CHEST WITHOUT CONTRAST  Technique:  Multidetector CT imaging of the chest was performed following the standard protocol without IV contrast.  Comparison: Chest x-ray from the same day.  Findings:  THORACIC INLET/BODY WALL:  Endotracheal tube terminates in the upper thoracic trachea. Gastric suction tube enters the stomach, which is largely decompressed.  There is a right IJ central venous catheter, tip in the SVC.  MEDIASTINUM:  No definite cardiomegaly.  Trace pericardial effusion or thickening.  Proximal LAD coronary artery atherosclerosis.  Four vessel aortic arch.  No evidence of acute vascular abnormality.  LUNG WINDOWS:  There is dense consolidation in the dependent right lung, affecting the lower lobe, posterior segment right upper lobe, and dependent right middle lobe.  There is linear high density material within the right lower lobe predominately.  Small effusion is present, predominately layering with some fluid extending into the major fissure.  The effusion measures up to 2 cm in thickness along the posterior chest argin.  No definitive PNA cavitation.  Less extensive consolidation, also with high density material, at the left base.  Nonspecific coarsened interstitium in the lingula. Centrilobular emphysema.  No pneumothorax.  UPPER ABDOMEN:  No acute findings.  OSSEOUS:  Diffuse degenerative disc disease.  There are flowing osteophytes or syndesmophytes throughout the thoracic region, with extensive ankylosis.  IMPRESSION: 1. Multifocal pneumonia, right worse than left. The dependent pattern and high density material within the consolidation suggests aspiration.  2. Small right parapneumonic effusion.  3.  Emphysema. 4.   Satisfactory positioning of support apparatus.   Original Report Authenticated By: Tiburcio Pea   US Renal Port  08/11/2012   *RADIOLOGY REPORT*  Clinical Data: 79 year old male with acute renal failure.  RENAL/URINARY TRACT ULTRASOUND COMPLETE  Comparison:  None.  Findings:  Right Kidney:  Central hypo echogenicity favored to reflect peripelvic cysts.  No definite hydronephrosis.  Superimposed exophytic upper pole cyst measuring 2.8 cm.  Right with renal length 11.1 cm.  The cortex cortex appears decreased in thickness and increased in echogenicity.  Left Kidney:  No hydronephrosis.  Renal length 10.4 cm.  Cortical thinning and increased echogenicity similar to the right side.  Bladder:  Not visualized, reportedly a Foley catheter is in place.  IMPRESSION: No definite hydronephrosis or acute renal finding.  Suspect parapelvic renal cysts on the right.  Evidence of chronic medical renal disease.   Original Report Authenticated By: Erskine Speed, M.D.   Dg Chest Port 1 View  08/11/2012   *RADIOLOGY REPORT*  Clinical Data: Central line placement.  PORTABLE CHEST - 1 VIEW  Comparison: Earlier film of the same day  Findings: Right IJ central line has its tip in the mid SVC.  No pneumothorax.  Endotracheal tube and nasogastric tube remain in place.  Coarse bibasilar airspace disease, right worse than left as before.  Possible layering right effusion.  Heart size upper limits normal for technique.  IMPRESSION:  1.  Central line to the mid SVC without pneumothorax. 2.  Little change in asymmetric airspace disease.   Original Report Authenticated By: D. Andria Rhein, MD   Dg Chest Port 1 View  08/11/2012   *RADIOLOGY REPORT*  Clinical Data: Intubated, shortness of breath  PORTABLE CHEST - 1  VIEW  Comparison: 08/10/2012; 08/09/2012; 03/01/2008  Findings:  Grossly unchanged enlarged cardiac silhouette and mediastinal contours with atherosclerotic calcifications within a tortuous thoracic aorta. Stable positioning of  support apparatus.  Interval worsening of right mid and now right lower lung heterogeneous and consolidative airspace opacities, now is obscuration of the right heart border.  Suspected development of a small right-sided pleural effusion.  Left lower lung heterogeneous air space opacities are grossly unchanged.  No pneumothorax.  Unchanged bones.  IMPRESSION: 1.  Stable positioning of support apparatus.  No pneumothorax. 2.  Worsening right mid and lower lung heterogeneous air space opacities worrisome for progression of multifocal infection, now with development of a small right-sided parapneumonic effusion. 3.  Grossly unchanged left lower lung heterogeneous opacities, atelectasis versus infiltrate.   Original Report Authenticated By: Tacey Ruiz, MD     eICU Interventions  No empyema  Plan No urgent need for thora      [facility] 08/12/2012, 3:44 AM

## 2012-08-12 NOTE — Progress Notes (Signed)
INITIAL NUTRITION ASSESSMENT  DOCUMENTATION CODES Per approved criteria  -Obesity Unspecified   INTERVENTION: 1. D/C Vital AF 1.2  2. Initiate Promote @ 20 ml/hr (goal rate)  3. 60 ml Prostat QID.  4. MVI daily  At goal rate, tube feeding regimen will provide 1280 kcal, 150 grams of protein, and 403 ml of H2O.    NUTRITION DIAGNOSIS: Inadequate oral intake related to inability to eat as evidenced by NPO status.  Goal: Enteral nutrition to provide 60-70% of estimated calorie needs (22-25 kcals/kg ideal body weight) and 100% of estimated protein needs, based on ASPEN guidelines for permissive underfeeding in critically ill obese individuals.  Monitor:  Vent status, TF tolerance, weight trend, labs  Reason for Assessment: Consult received to initiate and manage enteral nutrition support.  79 y.o. male  Admitting Dx: Acute respiratory failure with hypoxia  ASSESSMENT: Acute respiratory failure due to severe CAP, per MD possible necrotizing PNA.  Per pt's wife and daughter pt has had no recent weight changes. Pt with good appetite and eats at least 2 meals per day.  Patient is currently intubated on ventilator support.  MV: 12.1  Temp:Temp (24hrs), Avg:98.3 F (36.8 C), Min:97.6 F (36.4 C), Max:98.9 F (37.2 C)   Patient has OG tube. Vital AF 1.2 is infusing @ 40 ml/hr. Tube feeding regimen currently providing 1152 kcal, 72 grams protein, and 778 ml H2O.   Free water flushes: NA  Residuals: 0-10 ml  Height: Ht Readings from Last 1 Encounters:  08/09/12 5\' 8"  (1.727 m)    Weight: Wt Readings from Last 1 Encounters:  08/12/12 207 lb 14.3 oz (94.3 kg)  Admission weight 220 lb 7/12  Ideal Body Weight: 70 kg   % Ideal Body Weight: 135%  Wt Readings from Last 10 Encounters:  08/12/12 207 lb 14.3 oz (94.3 kg)    Usual Body Weight: 200's  % Usual Body Weight: 100%  BMI:  Body mass index is 31.62 kg/(m^2).  Estimated Nutritional Needs: Kcal:  1866 Protein: >/= 140 grams Fluid: > 2 L/day  Skin: Left knee wound  Diet Order: NPO  EDUCATION NEEDS: -No education needs identified at this time   Intake/Output Summary (Last 24 hours) at 08/12/12 1255 Last data filed at 08/12/12 1200  Gross per 24 hour  Intake   3545 ml  Output    940 ml  Net   2605 ml    Last BM: PTA   Labs:   Recent Labs Lab 08/10/12 0200 08/11/12 0400 08/12/12 0400  NA 141 142 144  K 4.1 4.9 3.8  CL 106 109 111  CO2 25 25 23   BUN 45* 61* 67*  CREATININE 4.36* 4.47* 4.48*  CALCIUM 9.0 8.6 8.6  MG  --  1.6  --   PHOS  --  3.1  --   GLUCOSE 131* 81 126*    CBG (last 3)   Recent Labs  08/12/12 0028 08/12/12 0340 08/12/12 1153  GLUCAP 97 131* 125*   Lab Results  Component Value Date   HGBA1C  Value: 5.9 (NOTE)   The ADA recommends the following therapeutic goal for glycemic   control related to Hgb A1C measurement:   Goal of Therapy:   < 7.0% Hgb A1C   Reference: American Diabetes Association: Clinical Practice   Recommendations 2008, Diabetes Care,  2008, 31:(Suppl 1). 03/01/2008   Scheduled Meds: . albuterol  8 puff Inhalation Q6H  . antiseptic oral rinse  15 mL Mouth Rinse QID  . aspirin  81 mg Per Tube Daily  . chlorhexidine  15 mL Mouth Rinse BID  . Chlorhexidine Gluconate Cloth  6 each Topical Daily  . heparin  5,000 Units Subcutaneous Q8H  . insulin aspart  1-3 Units Subcutaneous Q4H  . mupirocin ointment  1 application Nasal BID  . pantoprazole (PROTONIX) IV  40 mg Intravenous QHS  . piperacillin-tazobactam (ZOSYN)  IV  2.25 g Intravenous Q6H  . [START ON 08/13/2012] pneumococcal 23 valent vaccine  0.5 mL Intramuscular Tomorrow-1000  . vancomycin  1,500 mg Intravenous Q48H    Continuous Infusions: . sodium chloride 100 mL/hr at 08/11/12 1934  . DOPamine Stopped (08/11/12 1100)  . feeding supplement (VITAL AF 1.2 CAL) 1,000 mL (08/12/12 0000)  . fentaNYL infusion INTRAVENOUS 50 mcg/hr (08/12/12 1610)    Past Medical  History  Diagnosis Date  . Hypertension   . CHF (congestive heart failure)   . COPD (chronic obstructive pulmonary disease)   . Emphysema   . Diabetes mellitus without complication     non insulin dependent  . Cancer     prostate  . Renal disorder kidneys only function at 20%    pt does not want dialysis  . Stroke     TIA, multiple    Past Surgical History  Procedure Laterality Date  . Prostatectomy      Kendell Bane RD, LDN, CNSC [phone] Pager [phone] After Hours Pager

## 2012-08-12 NOTE — Progress Notes (Signed)
PULMONARY  / CRITICAL CARE MEDICINE  Name: Brad Jones MRN: [id number] DOB: February 19, 1927    ADMISSION DATE:  08/09/2012 CONSULTATION DATE:  08/10/2012  REFERRING MD :  [facility] EDP PRIMARY SERVICE: PCCM  CHIEF COMPLAINT:  Acute respiratory failure  BRIEF PATIENT DESCRIPTION: 79 y/o male with HTN and COPD was brought in by EMS unresponsive in the setting of severe CAP requiring intubation. Found to be in AKI and transferred to [facility] from [facility] ED on 7/12.  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 indeterminate; no acute intracranial abnormality 7/14 CT chest >>> consolidation R>L, concern for aspiration PNA   LINES / TUBES: ETT 7/12 >>>  R IJ 7/14 >>>   CULTURES: 7/12 blood >>> GPC chains >>>Strep pneumonia>>> 7/13 resp >>>  7/13 urine strep >>> Positive 7/13 Resp Virus Multiplex PCR >>> 7/13 urine legionella >>> 7/13 urine >>> no growth  ANTIBIOTICS: 7/12 ceftriaxone >>7/13  7/12 azithro >> 7/14 7/13 Vanc >>>  7/13 Zosyn >>>  SUBJECTIVE: intubated on vent, yesterday had CT chest and CVL placed  VITAL SIGNS: Temp:  [97.9 F (36.6 C)-99.1 F (37.3 C)] 98.5 F (36.9 C) (07/15 0800) Pulse Rate:  [67-116] 105 (07/15 0805) Resp:  [0-26] 23 (07/15 0805) BP: (66-160)/(46-102) 147/86 mmHg (07/15 0805) SpO2:  [95 %-100 %] 99 % (07/15 0805) FiO2 (%):  [40 %] 40 % (07/15 0805) Weight:  [207 lb 14.3 oz (94.3 kg)] 207 lb 14.3 oz (94.3 kg) (07/15 0500) HEMODYNAMICS: CVP:  [6 mmHg-15 mmHg] 15 mmHg VENTILATOR SETTINGS: Vent Mode:  [-] CPAP FiO2 (%):  [40 %] 40 % Set Rate:  [22 bmp] 22 bmp Vt Set:  [550 mL] 550 mL PEEP:  [5 cmH20] 5 cmH20 Pressure Support:  [10 cmH20] 10 cmH20 Plateau Pressure:  [19 cmH20-24 cmH20] 19 cmH20 INTAKE / OUTPUT: Intake/Output     07/14 0701 - 07/15 0700 07/15 0701 - 07/16 0700   I.V. (mL/kg) 2640 (28) 110 (1.2)   NG/GT 500 40   IV Piggyback 200    Total Intake(mL/kg) 3340 (35.4) 150 (1.6)   Urine (mL/kg/hr) 1240 (0.5) 75 (0.4)    Emesis/NG output     Total Output 1240 75   Net +2100 +75          PHYSICAL EXAMINATION: General:  NAD Neuro:  Eyes open, follows commands HEENT:  ETT in place Cardiovascular:  Tachycardic, regular rhythm Lungs:  bilat breath sounds present, coarse, some expiratory wheezes Abdomen:  Moderately distended but nontender to palpation Musculoskeletal:  Ext atraumatic Skin:  2+ pitting edema bilat calves, large psoriatic-like scaling over bilateral knees and thighs  LABS:  Recent Labs Lab 08/09/12 2000  08/09/12 2152 08/10/12 0200 08/10/12 0210 08/10/12 0818 08/10/12 1225  08/10/12 1716 08/10/12 2212 08/10/12 2214 08/11/12 0152 08/11/12 0340 08/11/12 0400 08/11/12 0414 08/11/12 0856 08/11/12 1630 08/12/12 0400  HGB 13.4  --   --  12.4*  --   --   --   --   --   --   --   --   --  12.0*  --   --   --  10.1*  WBC 30.1*  --   --  19.2*  --   --   --   --   --   --   --   --   --  16.7*  --   --   --  11.7*  PLT 109*  --   --  94*  --   --   --   --   --   --   --   --   --  97*  --   --   --  84*  NA 144  --   --  141  --   --   --   --   --   --   --   --   --  142  --   --   --  144  K 4.7  --   --  4.1  --   --   --   --   --   --   --   --   --  4.9  --   --   --  3.8  CL 104  --   --  106  --   --   --   --   --   --   --   --   --  109  --   --   --  111  CO2 27  --   --  25  --   --   --   --   --   --   --   --   --  25  --   --   --  23  GLUCOSE 132*  --   --  131*  --   --   --   --   --   --   --   --   --  81  --   --   --  126*  BUN 39*  --   --  45*  --   --   --   --   --   --   --   --   --  61*  --   --   --  67*  CREATININE 4.86*  --   --  4.36*  --   --   --   --   --   --   --   --   --  4.47*  --   --   --  4.48*  CALCIUM 9.6  --   --  9.0  --   --   --   --   --   --   --   --   --  8.6  --   --   --  8.6  MG  --   --   --   --   --   --   --   --   --   --   --   --   --  1.6  --   --   --   --   PHOS  --   --   --   --   --   --   --   --   --   --    --   --   --  3.1  --   --   --   --   AST 10  --   --   --   --   --   --   --   --   --   --   --   --  13  --   --   --   --   ALT 6  --   --   --   --   --   --   --   --   --   --   --   --  8  --   --   --   --  ALKPHOS 58  --   --   --   --   --   --   --   --   --   --   --   --  48  --   --   --   --   BILITOT 0.5  --   --   --   --   --   --   --   --   --   --   --   --  0.5  --   --   --   --   PROT 7.0  --   --   --   --   --   --   --   --   --   --   --   --  5.9*  --   --   --   --   ALBUMIN 2.9*  --   --   --   --   --   --   --   --   --   --   --   --  2.1*  --   --   --   --   APTT  --   --   --   --   --   --   --   --   --   --   --   --   --   --   --   --  37  --   LATICACIDVEN  --   < >  --  4.4*  --  2.6*  --   < >  --   --  1.3 1.1  --   --   --  1.6  --   --   TROPONINI <0.30  --   --   --   --   --   --   --  0.82* 0.76*  --   --   --   --   --  0.59*  --   --   PROCALCITON  --   --   --   --   --   --   --   --  117.71  --   --   --   --   --  112.01  --   --  76.15  PROBNP  --   --   --   --   --   --  23747.0*  --   --   --   --   --   --   --  10756.0*  --   --   --   PHART  --   < > 7.448  --  7.330*  --   --   --   --   --   --   --  7.343*  --   --   --   --   --   PCO2ART  --   < > 28.2*  --  43.5  --   --   --   --   --   --   --  43.9  --   --   --   --   --   PO2ART  --   < > 144.0*  --  64.0*  --   --   --   --   --   --   --  63.3*  --   --   --   --   --   < > =  values in this interval not displayed.  Recent Labs Lab 08/11/12 1139 08/11/12 1647 08/11/12 2109 08/12/12 0028 08/12/12 0340  GLUCAP 83 76 101* 97 131*    CXR: RLL opacity, ETT in good position   ASSESSMENT / PLAN:  PULMONARY A:Acute respiratory failure due to severe CAP  CT chest with consolidated PNA R concerning for aspiration, I have concerns necrotizing process? COPD P:  -full vent support -attempt SBP, cpap5 ps 5-10 goal 2 hrs, still requires PS greater 5  -even to pos  balance goals -off pressors, no role thora seems high risk on [place] and CT, re eval if declines  CARDIOVASCULAR A: Hypotension; Septic Shock Bacteremia  Hypertension - home rx on hold Elevated lactate, downtrending Elevated troponin Hx CHF, ProBNP markedly elevated Severe pulmonary HTN Tachycardia P:  -now off pressors -cortisol level 30, no role steroids -KVO IV as mild hypertensive now off pressors -echo EF 50-55%, grade 1 diastolic, PA pressure , moderate TR, decreased RV function, thickened aortic valve (?bicuspid) -add daily aspirin -CVP 15 in setting pulm htn -Consider low dose BB for tachycardia (pt on this at home) if remains an issue  RENAL A: Acute on chronic kidney disease (baseline unclear, but at least 2) Pt does not want dialysis under ANY circumstances = death P:  -foley in place  -KVO IV given large CVP -cvp 12-15 in setting pulm htn, hold lasix for now -Cr not improved -renal u/s no hydro or acute finding  GASTROINTESTINAL A: sepsis P:  -Pantoprazole for stress ulcer prophylaxis -Continue TF  HEMATOLOGIC A: Thrombocytopenia, resolving leukocytosis Anemia P:  -sub q hep for dvt prophylaxis, may need to hold pending am trend plat -trend CBC  INFECTIOUS A: Severe CAP R>L 7/13 BC w/ GPC in chains - strep likely Urine strep antigen positive P:  -have some concern necrtozing?, consider polymicrobial course and 10 days  - zosyn -will not narrow -dc vanc  ENDOCRINE A: DM -diet controlled  P:  -monitor glucose  -SSI   NEUROLOGIC A: Acute encephalopathy due to severe sepsis and respiratory failure  P:  -titrate sedation to RASS 0  -fentanyl gtt  -prn versed, avoid if able -chair   TODAY'S SUMMARY: SBT, continue abx, KVO fluids  Levert Latiffany Harwick, MD Family Medicine PGY-2   I have personally obtained a history, examined the patient, evaluated laboratory and imaging results, formulated the assessment and plan and placed orders. CRITICAL  CARE: The patient is critically ill with multiple organ systems failure and requires high complexity decision making for assessment and support, frequent evaluation and titration of therapies, application of advanced monitoring technologies and extensive interpretation of multiple databases. Critical Care Time devoted to patient care services described in this note is 30 minutes.   Mcarthur Rossetti. [name], MD, FACP Pgr: [phone] [facility]  Pulmonary and Critical Care Medicine [facility] Pager: [phone]  08/12/2012, 8:48 AM

## 2012-08-13 ENCOUNTER — Inpatient Hospital Stay (HOSPITAL_COMMUNITY): Payer: Medicare Other

## 2012-08-13 LAB — POCT I-STAT 3, ART BLOOD GAS (G3+)
Acid-base deficit: 5 mmol/L — ABNORMAL HIGH (ref 0.0–2.0)
Bicarbonate: 23.2 mEq/L (ref 20.0–24.0)
O2 Saturation: 90 %
Patient temperature: 98.6
TCO2: 25 mmol/L (ref 0–100)
pCO2 arterial: 56.9 mmHg — ABNORMAL HIGH (ref 35.0–45.0)
pH, Arterial: 7.218 — ABNORMAL LOW (ref 7.350–7.450)
pO2, Arterial: 72 mmHg — ABNORMAL LOW (ref 80.0–100.0)

## 2012-08-13 LAB — CBC
HCT: 33.3 % — ABNORMAL LOW (ref 39.0–52.0)
Hemoglobin: 10.9 g/dL — ABNORMAL LOW (ref 13.0–17.0)
MCH: 25.6 pg — ABNORMAL LOW (ref 26.0–34.0)
MCHC: 32.7 g/dL (ref 30.0–36.0)
MCV: 78.4 fL (ref 78.0–100.0)
Platelets: 80 10*3/uL — ABNORMAL LOW (ref 150–400)
RBC: 4.25 MIL/uL (ref 4.22–5.81)
RDW: 15.4 % (ref 11.5–15.5)
WBC: 11.3 10*3/uL — ABNORMAL HIGH (ref 4.0–10.5)

## 2012-08-13 LAB — GLUCOSE, CAPILLARY
Glucose-Capillary: 108 mg/dL — ABNORMAL HIGH (ref 70–99)
Glucose-Capillary: 101 mg/dL — ABNORMAL HIGH (ref 70–99)
Glucose-Capillary: 128 mg/dL — ABNORMAL HIGH (ref 70–99)
Glucose-Capillary: 108 mg/dL — ABNORMAL HIGH (ref 70–99)

## 2012-08-13 LAB — BASIC METABOLIC PANEL
BUN: 69 mg/dL — ABNORMAL HIGH (ref 6–23)
CO2: 24 mEq/L (ref 19–32)
Calcium: 8.8 mg/dL (ref 8.4–10.5)
Creatinine, Ser: 4.4 mg/dL — ABNORMAL HIGH (ref 0.50–1.35)
Glucose, Bld: 118 mg/dL — ABNORMAL HIGH (ref 70–99)
Sodium: 147 mEq/L — ABNORMAL HIGH (ref 135–145)

## 2012-08-13 LAB — CULTURE, RESPIRATORY W GRAM STAIN

## 2012-08-13 MED ORDER — METOPROLOL TARTRATE 25 MG/10 ML ORAL SUSPENSION
25.0000 mg | Freq: Two times a day (BID) | ORAL | Status: DC
  Administered 2012-08-13 (×2): 25 mg via ORAL
  Filled 2012-08-13 (×4): qty 10

## 2012-08-13 MED ORDER — FUROSEMIDE 10 MG/ML IJ SOLN
80.0000 mg | Freq: Three times a day (TID) | INTRAMUSCULAR | Status: DC
  Administered 2012-08-13 – 2012-08-14 (×3): 80 mg via INTRAVENOUS
  Filled 2012-08-13: qty 4
  Filled 2012-08-13 (×3): qty 8

## 2012-08-13 NOTE — Progress Notes (Signed)
ANTIBIOTIC CONSULT NOTE - FOLLOW UP  Pharmacy Consult:  Zosyn Indication:  Strep bacteremia + PNA, c/w necrotizing PNA  No Known Allergies  Patient Measurements: Height: 5\' 8"  (172.7 cm) Weight: 210 lb 12.2 oz (95.6 kg) IBW/kg (Calculated) : 68.4  Vital Signs: Temp: 98.2 F (36.8 C) (07/16 0324) Temp src: Oral (07/16 0324) BP: 139/90 mmHg (07/16 0700) Pulse Rate: 84 (07/16 0700) Intake/Output from previous day: 07/15 0701 - 07/16 0700 In: 1891.3 [I.V.:971.3; NG/GT:720; IV Piggyback:200] Out: 2005 [Urine:1855; Stool:150]  Labs:  Recent Labs  08/11/12 0400 08/12/12 0400 08/13/12 0423  WBC 16.7* 11.7* 11.3*  HGB 12.0* 10.1* 10.9*  PLT 97* 84* 80*  CREATININE 4.47* 4.48* 4.40*   Estimated Creatinine Clearance: 14.5 ml/min (by C-G formula based on Cr of 4.4). No results found for this basename: VANCOTROUGH, Leodis Binet, VANCORANDOM, GENTTROUGH, GENTPEAK, GENTRANDOM, TOBRATROUGH, TOBRAPEAK, TOBRARND, AMIKACINPEAK, AMIKACINTROU, AMIKACIN,  in the last 72 hours   Microbiology: Recent Results (from the past 720 hour(s))  CULTURE, BLOOD (ROUTINE X 2)     Status: None   Collection Time    08/09/12  8:19 PM      Result Value Range Status   Specimen Description LEFT ANTECUBITAL   Final   Special Requests BOTTLES DRAWN AEROBIC AND ANAEROBIC 8CC   Final   Culture  Setup Time 08/09/2012 20:19   Final   Culture     Final   Value: STREPTOCOCCUS PNEUMONIAE     Note: SUSCEPTIBILITIES PERFORMED ON PREVIOUS CULTURE WITHIN THE LAST 5 DAYS.     Note: Gram Stain Report Called to,Read Back By and Verified With: BECK A 08/10/12 0945 BY THOMPSONS Performed at [facility]   Report Status 08/12/2012 FINAL   Final  CULTURE, BLOOD (ROUTINE X 2)     Status: None   Collection Time    08/09/12  8:28 PM      Result Value Range Status   Specimen Description BLOOD RIGHT WRIST   Final   Special Requests BOTTLES DRAWN AEROBIC AND ANAEROBIC 8CC   Final   Culture  Setup Time 08/09/2012 20:28    Final   Culture     Final   Value: STREPTOCOCCUS PNEUMONIAE     Note: Gram Stain Report Called to,Read Back By and Verified With: BECK A 08/10/12 0945 BY THOMPSONS Performed at [facility]   Report Status 08/12/2012 FINAL   Final   Organism ID, Bacteria STREPTOCOCCUS PNEUMONIAE   Final  URINE CULTURE     Status: None   Collection Time    08/09/12  9:23 PM      Result Value Range Status   Specimen Description URINE, CATHETERIZED   Final   Special Requests NONE   Final   Culture  Setup Time 08/10/2012 23:38   Final   Colony Count NO GROWTH   Final   Culture NO GROWTH   Final   Report Status 08/11/2012 FINAL   Final  MRSA PCR SCREENING     Status: Abnormal   Collection Time    08/10/12 12:53 AM      Result Value Range Status   MRSA by PCR POSITIVE (*) NEGATIVE Final   Comment:            The GeneXpert MRSA Assay (FDA     approved for NASAL specimens     only), is one component of a     comprehensive MRSA colonization     surveillance program. It is not     intended  to diagnose MRSA     infection nor to guide or     monitor treatment for     MRSA infections.     RESULT CALLED TO, READ BACK BY AND VERIFIED WITH:     CLAUDIO,J RN [id number] 08/10/12 MITCHELL,L  CULTURE, RESPIRATORY (NON-EXPECTORATED)     Status: None   Collection Time    08/10/12  1:10 AM      Result Value Range Status   Specimen Description ENDOTRACHEAL ASPIRATE   Final   Special Requests NONE   Final   Gram Stain     Final   Value: RARE WBC PRESENT, PREDOMINANTLY PMN     RARE SQUAMOUS EPITHELIAL CELLS PRESENT     RARE GRAM POSITIVE COCCI     IN PAIRS   Culture     Final   Value: MODERATE STREPTOCOCCUS PNEUMONIAE     FEW YEAST CONSISTENT WITH CANDIDA SPECIES   Report Status 08/13/2012 FINAL   Final   Organism ID, Bacteria STREPTOCOCCUS PNEUMONIAE   Final  URINE CULTURE     Status: None   Collection Time    08/10/12  7:50 AM      Result Value Range Status   Specimen Description URINE, CATHETERIZED    Final   Special Requests NONE   Final   Culture  Setup Time 08/10/2012 20:49   Final   Colony Count NO GROWTH   Final   Culture NO GROWTH   Final   Report Status 08/11/2012 FINAL   Final  CULTURE, RESPIRATORY (NON-EXPECTORATED)     Status: None   Collection Time    08/10/12  4:30 PM      Result Value Range Status   Specimen Description TRACHEAL ASPIRATE   Final   Special Requests Normal   Final   Gram Stain     Final   Value: RARE WBC PRESENT,BOTH PMN AND MONONUCLEAR     RARE SQUAMOUS EPITHELIAL CELLS PRESENT     RARE GRAM POSITIVE COCCI     IN PAIRS   Culture Non-Pathogenic Oropharyngeal-type Flora Isolated.   Final   Report Status 08/12/2012 FINAL   Final       Assessment: 28 YOM with Streptococcus pneumoniae bacteremia and PNA to continue on Zosyn therapy.  Patient's renal function remains inadequate with CrCL < 20 ml/min.  7/13 Vanc >> 7/15 7/13 Zosyn >> 7/12 Azithro >> 7/14   7/13 MRSA PCR - positive 7/13 Strep pneumo Ag - positive 7/12 BCx x 2 - Strep pneumo (sensitive to CTX, LVQ, PCN) 7/13 UCx - negative 7/13 Resp Cx - Strep, few Candida   Goal of Therapy:  Clearance of infection   Plan:  - Zosyn 2.25gm IV Q6H, through 08/20/11 - Monitor renal fxn and adjust dose as appropriate     Hazen Brumett D. Laney Potash, PharmD, BCPS Pager:  [phone] 08/13/2012, 8:08 AM

## 2012-08-13 NOTE — Progress Notes (Signed)
PULMONARY  / CRITICAL CARE MEDICINE  Name: Brad Jones MRN: [id number] DOB: Feb 20, 1927    ADMISSION DATE:  08/09/2012 CONSULTATION DATE:  08/10/2012  REFERRING MD :  [facility] ED[facility] PRIMARY SERVICE: PCCM  CHIEF COMPLAINT:  Acute respiratory failure  BRIEF PATIENT DESCRIPTION: 79 y/o male with HTN and COPD was brought in by EMS unresponsive in the setting of severe CAP requiring intubation. Found to be in AKI and transferred to [facility] from [facility] ED on 7/12.  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 indeterminate; no acute intracranial abnormality 7/14 CT chest >>> consolidation R>L, concern for aspiration PNA 7/15- poor weaning   LINES / TUBES: ETT 7/12 >>>  R IJ 7/14 >>>   CULTURES: 7/12 blood >>> GPC chains >>>Strep pneumonia>>>pan sensitive 7/13 resp >>> strep pneumo pan sensitive, some yeast 7/13 urine strep >>> Positive 7/13 Resp Virus Multiplex PCR >>> 7/13 urine legionella >>>negative 7/13 urine >>> no growth  ANTIBIOTICS: 7/12 ceftriaxone >>7/13  7/12 azithro >> 7/14 7/13 Vanc >>> 7/15 7/13 Zosyn >>>  SUBJECTIVE: intubated on vent  VITAL SIGNS: Temp:  [97.6 F (36.4 C)-98.9 F (37.2 C)] 98.2 F (36.8 C) (07/16 0324) Pulse Rate:  [63-123] 84 (07/16 0700) Resp:  [18-23] 21 (07/16 0700) BP: (89-171)/(52-99) 139/90 mmHg (07/16 0700) SpO2:  [93 %-100 %] 100 % (07/16 0700) FiO2 (%):  [40 %] 40 % (07/16 0310) Weight:  [210 lb 12.2 oz (95.6 kg)] 210 lb 12.2 oz (95.6 kg) (07/16 0400) HEMODYNAMICS: CVP:  [10 mmHg-24 mmHg] 15 mmHg VENTILATOR SETTINGS: Vent Mode:  [-] PRVC FiO2 (%):  [40 %] 40 % Set Rate:  [22 bmp] 22 bmp Vt Set:  [550 mL] 550 mL PEEP:  [5 cmH20] 5 cmH20 Pressure Support:  [5 cmH20-10 cmH20] 5 cmH20 Plateau Pressure:  [18 cmH20-24 cmH20] 21 cmH20 INTAKE / OUTPUT: Intake/Output     07/15 0701 - 07/16 0700 07/16 0701 - 07/17 0700   I.V. (mL/kg) 971.3 (10.2)    NG/GT 720    IV Piggyback 200    Total Intake(mL/kg) 1891.3 (19.8)    Urine (mL/kg/hr) 1855 (0.8)    Stool 150 (0.1)    Total Output 2005     Net -113.8          Stool Occurrence 3 x      PHYSICAL EXAMINATION: General:  NAD Neuro:  Eyes open, gestures with hand HEENT:  ETT in place Cardiovascular:  RRR on monitor Lungs:  bilat breath sounds present, clearer than previously Abdomen:  Soft, NTTP, +BS Musculoskeletal:  Ext atraumatic Skin:  2+ pitting edema bilat calves  LABS:  Recent Labs Lab 08/09/12 2000  08/10/12 0200 08/10/12 0210 08/10/12 0818 08/10/12 1225  08/10/12 1716 08/10/12 2212 08/10/12 2214 08/11/12 0152 08/11/12 0340 08/11/12 0400 08/11/12 0414 08/11/12 0856 08/11/12 1630 08/12/12 0400 08/12/12 1248 08/13/12 0423  HGB 13.4  --  12.4*  --   --   --   --   --   --   --   --   --  12.0*  --   --   --  10.1*  --  10.9*  WBC 30.1*  --  19.2*  --   --   --   --   --   --   --   --   --  16.7*  --   --   --  11.7*  --  11.3*  PLT 109*  --  94*  --   --   --   --   --   --   --   --   --  97*  --   --   --  84*  --  80*  NA 144  --  141  --   --   --   --   --   --   --   --   --  142  --   --   --  144  --  147*  K 4.7  --  4.1  --   --   --   --   --   --   --   --   --  4.9  --   --   --  3.8  --  4.2  CL 104  --  106  --   --   --   --   --   --   --   --   --  109  --   --   --  111  --  114*  CO2 27  --  25  --   --   --   --   --   --   --   --   --  25  --   --   --  23  --  24  GLUCOSE 132*  --  131*  --   --   --   --   --   --   --   --   --  81  --   --   --  126*  --  118*  BUN 39*  --  45*  --   --   --   --   --   --   --   --   --  61*  --   --   --  67*  --  69*  CREATININE 4.86*  --  4.36*  --   --   --   --   --   --   --   --   --  4.47*  --   --   --  4.48*  --  4.40*  CALCIUM 9.6  --  9.0  --   --   --   --   --   --   --   --   --  8.6  --   --   --  8.6  --  8.8  MG  --   --   --   --   --   --   --   --   --   --   --   --  1.6  --   --   --   --   --   --   PHOS  --   --   --   --   --   --   --   --    --   --   --   --  3.1  --   --   --   --   --   --   AST 10  --   --   --   --   --   --   --   --   --   --   --  13  --   --   --   --   --   --   ALT 6  --   --   --   --   --   --   --   --   --   --   --  8  --   --   --   --   --   --   ALKPHOS 58  --   --   --   --   --   --   --   --   --   --   --  48  --   --   --   --   --   --   BILITOT 0.5  --   --   --   --   --   --   --   --   --   --   --  0.5  --   --   --   --   --   --   PROT 7.0  --   --   --   --   --   --   --   --   --   --   --  5.9*  --   --   --   --   --   --   ALBUMIN 2.9*  --   --   --   --   --   --   --   --   --   --   --  2.1*  --   --   --   --   --   --   APTT  --   --   --   --   --   --   --   --   --   --   --   --   --   --   --  37  --   --   --   LATICACIDVEN  --   < > 4.4*  --  2.6*  --   < >  --   --  1.3 1.1  --   --   --  1.6  --   --   --   --   TROPONINI <0.30  --   --   --   --   --   --  0.82* 0.76*  --   --   --   --   --  0.59*  --   --   --   --   PROCALCITON  --   --   --   --   --   --   --  117.71  --   --   --   --   --  112.01  --   --  76.15  --   --   PROBNP  --   --   --   --   --  23747.0*  --   --   --   --   --   --   --  10756.0*  --   --   --   --   --   PHART  --   < >  --  7.330*  --   --   --   --   --   --   --  7.343*  --   --   --   --   --  7.163*  --   PCO2ART  --   < >  --  43.5  --   --   --   --   --   --   --  43.9  --   --   --   --   --  65.2*  --   PO2ART  --   < >  --  64.0*  --   --   --   --   --   --   --  63.3*  --   --   --   --   --  79.0*  --   < > = values in this interval not displayed.  Recent Labs Lab 08/12/12 1153 08/12/12 1642 08/12/12 1947 08/13/12 0006 08/13/12 0322  GLUCAP 125* 95 105* 120* 108*    CXR: RLL opacity slightly improved, ETT in good position   ASSESSMENT / PLAN:  PULMONARY A:Acute respiratory failure due to severe CAP  CT chest with consolidated PNA R concerning for aspiration, necrotizing process COPD P:  -full vent  support -attempt SBP repeat , cpap5 ps 5-10 goal 2 hrs, concern for last abg, close observation TV / apnea alarms -if appears well on 5/5, will again need abg -neg balance goals, lasix start, hope this benefits weaning as pos 6 liters -off pressors, no role thora seems high risk on [place] and CT, re eval if declines   CARDIOVASCULAR A: Hypotension resolved Septic Shock Bacteremia  Hypertension - home rx on hold Elevated lactate, downtrending Elevated troponin Hx CHF, ProBNP markedly elevated Severe pulmonary HTN Tachycardia mild P:  -now off pressors and hypertensive -lasix 80 q8h, may need higher doses -cortisol level 30, no role steroids -KVO IVs -echo EF 50-55%, grade 1 diastolic, PA pressure , moderate TR, decreased RV function, thickened aortic valve (?bicuspid) -daily aspirin -CVP 10 in setting pulm htn -add BB given hypertensive  RENAL A: Acute on chronic kidney disease (baseline unclear, but at least 2) Pt does not want dialysis under ANY circumstances = death P:  -foley in place  -KVO IV -Cr at baseline likely -renal u/s no hydro or acute finding -lasix start aggressive  GASTROINTESTINAL A: sepsis P:  -Pantoprazole for stress ulcer prophylaxis -Continue TF, hold if weaning improved  HEMATOLOGIC A: Thrombocytopenia, resolving leukocytosis Anemia P:  -sub q hep for dvt prophylaxis, may need to hold pending am trend plat (down from 84 to 80 today) -trend CBC  INFECTIOUS A: Severe CAP R>L 7/13 BC w/ GPC in chains - strep pneumo Urine strep antigen positive P:  -have some concern necrotizing,  zosyn -will not narrow  ENDOCRINE A: DM -diet controlled  P:  -monitor glucose  -SSI   NEUROLOGIC A: Acute encephalopathy due to severe sepsis and respiratory failure  P:  -titrate sedation to RASS 0  -fentanyl gtt  -prn versed, avoid if able -chair position  TODAY'S SUMMARY: SBT, continue zosyn, lasix to neg balance  Levert Kaaliyah Kita, MD Family  Medicine PGY-2   I have personally obtained a history, examined the patient, evaluated laboratory and imaging results, formulated the assessment and plan and placed orders. CRITICAL CARE: The patient is critically ill with multiple organ systems failure and requires high complexity decision making for assessment and support, frequent evaluation and titration of therapies, application of advanced monitoring technologies and extensive interpretation of multiple databases. Critical Care Time devoted to patient care services described in this note is 30 minutes.   Mcarthur Rossetti. [name], MD, FACP Pgr: [phone] [facility]  Pulmonary and Critical Care Medicine [facility] Pager: [phone]  08/13/2012, 8:04 AM

## 2012-08-14 ENCOUNTER — Inpatient Hospital Stay (HOSPITAL_COMMUNITY): Payer: Medicare Other

## 2012-08-14 LAB — POCT I-STAT 3, ART BLOOD GAS (G3+)
Bicarbonate: 28.1 mEq/L — ABNORMAL HIGH (ref 20.0–24.0)
Patient temperature: 98.8
TCO2: 30 mmol/L (ref 0–100)
pCO2 arterial: 52.1 mmHg — ABNORMAL HIGH (ref 35.0–45.0)
pH, Arterial: 7.341 — ABNORMAL LOW (ref 7.350–7.450)

## 2012-08-14 LAB — CBC
HCT: 35.3 % — ABNORMAL LOW (ref 39.0–52.0)
MCHC: 32.6 g/dL (ref 30.0–36.0)
Platelets: 94 10*3/uL — ABNORMAL LOW (ref 150–400)
RDW: 15.3 % (ref 11.5–15.5)
WBC: 10.2 10*3/uL (ref 4.0–10.5)

## 2012-08-14 LAB — GLUCOSE, CAPILLARY: Glucose-Capillary: 114 mg/dL — ABNORMAL HIGH (ref 70–99)

## 2012-08-14 LAB — BASIC METABOLIC PANEL
BUN: 75 mg/dL — ABNORMAL HIGH (ref 6–23)
Calcium: 9.4 mg/dL (ref 8.4–10.5)
Creatinine, Ser: 4.5 mg/dL — ABNORMAL HIGH (ref 0.50–1.35)
GFR calc Af Amer: 13 mL/min — ABNORMAL LOW (ref >90)

## 2012-08-14 MED ORDER — METOPROLOL TARTRATE 25 MG/10 ML ORAL SUSPENSION
50.0000 mg | Freq: Two times a day (BID) | ORAL | Status: DC
  Administered 2012-08-14: 50 mg via ORAL
  Filled 2012-08-14 (×2): qty 20

## 2012-08-14 MED ORDER — ALBUTEROL SULFATE HFA 108 (90 BASE) MCG/ACT IN AERS
2.0000 | INHALATION_SPRAY | Freq: Four times a day (QID) | RESPIRATORY_TRACT | Status: DC
  Administered 2012-08-14 – 2012-08-15 (×3): 2 via RESPIRATORY_TRACT
  Filled 2012-08-14: qty 6.7

## 2012-08-14 MED ORDER — FUROSEMIDE 10 MG/ML IJ SOLN
40.0000 mg | Freq: Two times a day (BID) | INTRAMUSCULAR | Status: DC
  Administered 2012-08-14 – 2012-08-15 (×2): 40 mg via INTRAVENOUS
  Filled 2012-08-14 (×2): qty 4

## 2012-08-14 MED ORDER — INSULIN ASPART 100 UNIT/ML ~~LOC~~ SOLN
0.0000 [IU] | SUBCUTANEOUS | Status: DC
  Administered 2012-08-14: 2 [IU] via SUBCUTANEOUS

## 2012-08-14 MED ORDER — INSULIN ASPART 100 UNIT/ML ~~LOC~~ SOLN
0.0000 [IU] | Freq: Three times a day (TID) | SUBCUTANEOUS | Status: DC
  Administered 2012-08-15: 1 [IU] via SUBCUTANEOUS

## 2012-08-14 MED ORDER — PANTOPRAZOLE SODIUM 40 MG PO PACK
40.0000 mg | PACK | Freq: Every day | ORAL | Status: DC
  Administered 2012-08-14: 40 mg
  Filled 2012-08-14 (×2): qty 20

## 2012-08-14 MED ORDER — METOPROLOL TARTRATE 50 MG PO TABS
50.0000 mg | ORAL_TABLET | Freq: Two times a day (BID) | ORAL | Status: DC
  Administered 2012-08-14 – 2012-08-18 (×8): 50 mg via ORAL
  Filled 2012-08-14 (×9): qty 1

## 2012-08-14 NOTE — Progress Notes (Signed)
NUTRITION FOLLOW UP  Intervention:   Supplement diet as appropriate.   Nutrition Dx:   Inadequate oral intake now related to limited diet as evidenced by clear liquid diet.   Goal:   Enteral nutrition to provide 60-70% of estimated calorie needs (22-25 kcals/kg ideal body weight) and 100% of estimated protein needs, based on ASPEN guidelines for permissive underfeeding in critically ill obese individuals; na.   New Goal:  Pt to meet >/= 90% of their estimated nutrition needs.   Monitor:   Diet advancement, PO intake, weight trend, labs  Assessment:   Pt with acute respiratory failure due to severe CAP. Pt extubated 7/17. Pt also with hx of DM and CHF.  Per family pt with no recent weight changes and good appetite PTA.   Height: Ht Readings from Last 1 Encounters:  08/09/12 5\' 8"  (1.727 m)    Weight Status:   Wt Readings from Last 1 Encounters:  08/14/12 197 lb 1.5 oz (89.4 kg)    Re-estimated needs:  Kcal: 1800-2000 Protein: 90-100 grams Fluid: > 1.8 L/day  Skin: Left knee wound  Diet Order: Clear Liquid   Intake/Output Summary (Last 24 hours) at 08/14/12 1506 Last data filed at 08/14/12 1300  Gross per 24 hour  Intake   1590 ml  Output   7950 ml  Net  -6360 ml    Last BM: rectal pouch   Labs:   Recent Labs Lab 08/10/12 0200 08/11/12 0400 08/12/12 0400 08/13/12 0423 08/14/12 0415  NA 141 142 144 147* 149*  K 4.1 4.9 3.8 4.2 3.6  CL 106 109 111 114* 111  CO2 25 25 23 24 28   BUN 45* 61* 67* 69* 75*  CREATININE 4.36* 4.47* 4.48* 4.40* 4.50*  CALCIUM 9.0 8.6 8.6 8.8 9.4  MG  --  1.6  --   --   --   PHOS  --  3.1  --   --   --   GLUCOSE 131* 81 126* 118* 114*    CBG (last 3)   Recent Labs  08/14/12 0345 08/14/12 0818 08/14/12 1129  GLUCAP 103* 102* 169*    Scheduled Meds: . albuterol  8 puff Inhalation Q6H  . antiseptic oral rinse  15 mL Mouth Rinse QID  . aspirin  81 mg Per Tube Daily  . chlorhexidine  15 mL Mouth Rinse BID  .  Chlorhexidine Gluconate Cloth  6 each Topical Daily  . furosemide  40 mg Intravenous Q12H  . heparin  5,000 Units Subcutaneous Q8H  . insulin aspart  0-9 Units Subcutaneous TID WC  . metoprolol tartrate  50 mg Oral BID  . multivitamin with minerals  1 tablet Per Tube Daily  . mupirocin ointment  1 application Nasal BID  . pantoprazole sodium  40 mg Per Tube Daily  . piperacillin-tazobactam (ZOSYN)  IV  2.25 g Intravenous Q6H    Continuous Infusions: . sodium chloride 20 mL/hr at 08/12/12 [address], LDN, CNSC [phone] Pager [phone] After Hours Pager

## 2012-08-14 NOTE — Procedures (Signed)
Extubation Procedure Note  Patient Details:   Name: Brad Jones DOB: [birth year] MRN: [id number]   Airway Documentation:  Airway 7.5 mm (Active)  Secured at (cm) 23 cm 08/14/2012  8:12 AM  Measured From Lips 08/14/2012  8:12 AM  Secured Location Right 08/14/2012  8:12 AM  Secured By [employer] 08/14/2012  8:12 AM  Tube Holder Repositioned Yes 08/14/2012  8:12 AM  Cuff Pressure (cm H2O) 21 cm H2O 08/13/2012  3:10 AM  Site Condition Dry 08/14/2012  8:12 AM    Evaluation  O2 sats: stable throughout Complications: No apparent complications Patient did tolerate procedure well. Bilateral Breath Sounds: Clear;Diminished Suctioning: Oral Yes  [name] 08/14/2012, 11:18 AM

## 2012-08-14 NOTE — Progress Notes (Addendum)
PULMONARY  / CRITICAL CARE MEDICINE  Name: Brad Jones MRN: [id number] DOB: [birth year]    ADMISSION DATE:  08/09/2012 CONSULTATION DATE:  08/10/2012  REFERRING MD :  [facility] PRIMARY SERVICE: PCCM  CHIEF COMPLAINT:  Acute respiratory failure  BRIEF PATIENT DESCRIPTION: 79 y/o male with HTN and COPD was brought in by EMS unresponsive in the setting of severe CAP requiring intubation. Found to be in AKI and transferred to [facility] from [facility] on 7/12.  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 indeterminate; no acute intracranial abnormality 7/14 CT chest >>> consolidation R>L, concern for aspiration PNA 7/15- poor weaning abg, neg 5.1 liters!!   LINES / TUBES: ETT 7/12 >>>  R IJ 7/14 >>>   CULTURES: 7/12 blood >>> GPC chains >>>Strep pneumonia>>>pan sensitive 7/13 resp >>> strep pneumo pan sensitive, some yeast 7/13 urine strep >>> Positive 7/13 Resp Virus Multiplex PCR >>> 7/13 urine legionella >>>negative 7/13 urine >>> no growth  ANTIBIOTICS: 7/12 ceftriaxone >>7/13  7/12 azithro >> 7/14 7/13 Vanc >>> 7/15 7/13 Zosyn >>>stop 22nd (likley polymicrobial)  SUBJECTIVE: intubated on vent  VITAL SIGNS: Temp:  [98 F (36.7 C)-99.2 F (37.3 C)] 98.8 F (37.1 C) (07/17 0834) Pulse Rate:  [65-111] 99 (07/17 0812) Resp:  [18-28] 19 (07/17 0812) BP: (123-183)/(68-113) 165/90 mmHg (07/17 0812) SpO2:  [93 %-100 %] 93 % (07/17 0812) FiO2 (%):  [30 %] 30 % (07/17 0812) Weight:  [197 lb 1.5 oz (89.4 kg)] 197 lb 1.5 oz (89.4 kg) (07/17 0400) HEMODYNAMICS: CVP:  [6 mmHg-12 mmHg] 12 mmHg VENTILATOR SETTINGS: Vent Mode:  [-] PSV FiO2 (%):  [30 %] 30 % Set Rate:  [22 bmp] 22 bmp Vt Set:  [550 mL] 550 mL PEEP:  [5 cmH20] 5 cmH20 Pressure Support:  [5 cmH20] 5 cmH20 Plateau Pressure:  [18 cmH20-22 cmH20] 22 cmH20 INTAKE / OUTPUT: Intake/Output     07/16 0701 - 07/17 0700 07/17 0701 - 07/18 0700   I.V. (mL/kg) 750 (8.4) 35 (0.4)   NG/GT 820 50   IV Piggyback  50    Total Intake(mL/kg) 1620 (18.1) 85 (1)   Urine (mL/kg/hr) 6285 (2.9) 1175 (7.3)   Stool 450 (0.2) 25 (0.2)   Total Output 6735 1200   Net -5115 -1115        Urine Occurrence 1 x      PHYSICAL EXAMINATION: General:  NAD Neuro:  Eyes open, gestures with hands HEENT:  ETT in place Cardiovascular:  Tachycardic regular rhythm Lungs:  bilat breath sounds present, clear Abdomen:  Soft, NTTP, +BS Musculoskeletal:  Ext atraumatic Skin:  2+ pitting edema bilat calves  LABS:  Recent Labs Lab 08/09/12 2000  08/10/12 0200  08/10/12 0818 08/10/12 1225  08/10/12 1716 08/10/12 2212 08/10/12 2214 08/11/12 0152 08/11/12 0340 08/11/12 0400 08/11/12 0414 08/11/12 0856 08/11/12 1630 08/12/12 0400 08/12/12 1248 08/13/12 0423 08/13/12 1053 08/14/12 0415  HGB 13.4  --  12.4*  --   --   --   --   --   --   --   --   --  12.0*  --   --   --  10.1*  --  10.9*  --  11.5*  WBC 30.1*  --  19.2*  --   --   --   --   --   --   --   --   --  16.7*  --   --   --  11.7*  --  11.3*  --  10.2  PLT 109*  --  94*  --   --   --   --   --   --   --   --   --  97*  --   --   --  84*  --  80*  --  94*  NA 144  --  141  --   --   --   --   --   --   --   --   --  142  --   --   --  144  --  147*  --  149*  K 4.7  --  4.1  --   --   --   --   --   --   --   --   --  4.9  --   --   --  3.8  --  4.2  --  3.6  CL 104  --  106  --   --   --   --   --   --   --   --   --  109  --   --   --  111  --  114*  --  111  CO2 27  --  25  --   --   --   --   --   --   --   --   --  25  --   --   --  23  --  24  --  28  GLUCOSE 132*  --  131*  --   --   --   --   --   --   --   --   --  81  --   --   --  126*  --  118*  --  114*  BUN 39*  --  45*  --   --   --   --   --   --   --   --   --  61*  --   --   --  67*  --  69*  --  75*  CREATININE 4.86*  --  4.36*  --   --   --   --   --   --   --   --   --  4.47*  --   --   --  4.48*  --  4.40*  --  4.50*  CALCIUM 9.6  --  9.0  --   --   --   --   --   --   --   --   --   8.6  --   --   --  8.6  --  8.8  --  9.4  MG  --   --   --   --   --   --   --   --   --   --   --   --  1.6  --   --   --   --   --   --   --   --   PHOS  --   --   --   --   --   --   --   --   --   --   --   --  3.1  --   --   --   --   --   --   --   --  AST 10  --   --   --   --   --   --   --   --   --   --   --  13  --   --   --   --   --   --   --   --   ALT 6  --   --   --   --   --   --   --   --   --   --   --  8  --   --   --   --   --   --   --   --   ALKPHOS 58  --   --   --   --   --   --   --   --   --   --   --  48  --   --   --   --   --   --   --   --   BILITOT 0.5  --   --   --   --   --   --   --   --   --   --   --  0.5  --   --   --   --   --   --   --   --   PROT 7.0  --   --   --   --   --   --   --   --   --   --   --  5.9*  --   --   --   --   --   --   --   --   ALBUMIN 2.9*  --   --   --   --   --   --   --   --   --   --   --  2.1*  --   --   --   --   --   --   --   --   APTT  --   --   --   --   --   --   --   --   --   --   --   --   --   --   --  37  --   --   --   --   --   LATICACIDVEN  --   < > 4.4*  --  2.6*  --   < >  --   --  1.3 1.1  --   --   --  1.6  --   --   --   --   --   --   TROPONINI <0.30  --   --   --   --   --   --  0.82* 0.76*  --   --   --   --   --  0.59*  --   --   --   --   --   --   PROCALCITON  --   --   --   --   --   --   --  117.71  --   --   --   --   --  112.01  --   --  76.15  --   --   --   --  PROBNP  --   --   --   --   --  23747.0*  --   --   --   --   --   --   --  10756.0*  --   --   --   --   --   --   --   PHART  --   < >  --   < >  --   --   --   --   --   --   --  7.343*  --   --   --   --   --  7.163*  --  7.218*  --   PCO2ART  --   < >  --   < >  --   --   --   --   --   --   --  43.9  --   --   --   --   --  65.2*  --  56.9*  --   PO2ART  --   < >  --   < >  --   --   --   --   --   --   --  63.3*  --   --   --   --   --  79.0*  --  72.0*  --   < > = values in this interval not displayed.  Recent Labs Lab  08/13/12 1647 08/13/12 1948 08/14/12 0018 08/14/12 0345 08/14/12 0818  GLUCAP 108* 115* 114* 103* 102*    CXR: ETT in good position, improved aeration   ASSESSMENT / PLAN:  PULMONARY A:Acute respiratory failure due to severe CAP  CT chest with consolidated PNA R concerning for aspiration, necrotizing process COPD Component edema P:  -attempt SBP repeat , cpap5 ps 5-10 goal 2 hrs, concern for last abg, close observation TV / apnea alarms, abg again after 30 min -if appears well on 5/5, will again need abg -neg balance goals, lasix added yesterday with 6L diuresis and improved pcxr -neg balance 1 liter goal next 24 hrs   CARDIOVASCULAR A: Hypotension resolved Septic Shock Bacteremia  Hypertension - home rx on hold Elevated lactate, downtrending Elevated troponin Hx CHF, ProBNP markedly elevated Severe pulmonary HTN P:  -now off pressors and hypertensive -lasix 80 q8h with good response, see renal -KVO IVs -echo EF 50-55%, grade 1 diastolic, PA pressure , moderate TR, decreased RV function, thickened aortic valve (?bicuspid) -daily aspirin -CVP dc -continue BB but increase to 50 mg BID  RENAL A: Acute on chronic kidney disease (baseline unclear, but at least 2) Pt does not want dialysis under ANY circumstances = death Hypernatremia after excellent response lasix P:  -foley in place  -KVO IV -Cr at baseline likely -renal u/s no hydro or acute finding -lasix continue, reduce  GASTROINTESTINAL A: sepsis P:  -Pantoprazole for stress ulcer prophylaxis -Continue TF, hold if weaning improved  HEMATOLOGIC A: Thrombocytopenia, resolving leukocytosis Anemia P:  -sub q hep for dvt prophylaxis, may need to hold pending am trend plat (actually increased today) -trend CBC  INFECTIOUS A: Severe CAP R>L 7/13 BC w/ GPC in chains - strep pneumo Urine strep antigen positive P:  -have some concern necrotizing,  Zosyn, stop date in place  ENDOCRINE A: DM -diet  controlled  P:  -monitor glucose  -SSI   NEUROLOGIC A: Acute encephalopathy due to severe sepsis and respiratory failure  P:  -titrate sedation to  RASS 0  -fentanyl gtt , wua -prn versed, avoid if able -chair position  TODAY'S SUMMARY: SBT, continue zosyn, lasix to neg balance, increase metoprolol, weaning, assess for extubation  Levert Kierre Hintz, MD Family Medicine PGY-2   I have personally obtained a history, examined the patient, evaluated laboratory and imaging results, formulated the assessment and plan and placed orders. CRITICAL CARE: The patient is critically ill with multiple organ systems failure and requires high complexity decision making for assessment and support, frequent evaluation and titration of therapies, application of advanced monitoring technologies and extensive interpretation of multiple databases. Critical Care Time devoted to patient care services described in this note is 30 minutes.   Mcarthur Rossetti. [name], MD, FACP Pgr: [phone] [place] [facility]  Pulmonary and Critical Care Medicine [facility] Pager: [phone]  08/14/2012, 8:49 AM

## 2012-08-15 ENCOUNTER — Inpatient Hospital Stay (HOSPITAL_COMMUNITY): Payer: Medicare Other

## 2012-08-15 DIAGNOSIS — R5381 Other malaise: Secondary | ICD-10-CM

## 2012-08-15 DIAGNOSIS — A4189 Other specified sepsis: Secondary | ICD-10-CM

## 2012-08-15 LAB — CBC
Hemoglobin: 12.1 g/dL — ABNORMAL LOW (ref 13.0–17.0)
MCH: 24.3 pg — ABNORMAL LOW (ref 26.0–34.0)
MCV: 80.9 fL (ref 78.0–100.0)
RBC: 4.97 MIL/uL (ref 4.22–5.81)

## 2012-08-15 LAB — BASIC METABOLIC PANEL
CO2: 37 mEq/L — ABNORMAL HIGH (ref 19–32)
Glucose, Bld: 101 mg/dL — ABNORMAL HIGH (ref 70–99)
Potassium: 3.8 mEq/L (ref 3.5–5.1)
Sodium: 151 mEq/L — ABNORMAL HIGH (ref 135–145)

## 2012-08-15 MED ORDER — AMOXICILLIN 500 MG PO CAPS
500.0000 mg | ORAL_CAPSULE | Freq: Two times a day (BID) | ORAL | Status: AC
  Administered 2012-08-15 – 2012-08-16 (×4): 500 mg via ORAL
  Filled 2012-08-15 (×4): qty 1

## 2012-08-15 MED ORDER — BIOTENE DRY MOUTH MT LIQD
15.0000 mL | Freq: Two times a day (BID) | OROMUCOSAL | Status: DC
  Administered 2012-08-16 – 2012-08-18 (×5): 15 mL via OROMUCOSAL

## 2012-08-15 MED ORDER — ALBUTEROL SULFATE HFA 108 (90 BASE) MCG/ACT IN AERS
2.0000 | INHALATION_SPRAY | Freq: Four times a day (QID) | RESPIRATORY_TRACT | Status: DC | PRN
  Filled 2012-08-15: qty 6.7

## 2012-08-15 MED ORDER — TIOTROPIUM BROMIDE MONOHYDRATE 18 MCG IN CAPS
18.0000 ug | ORAL_CAPSULE | Freq: Every day | RESPIRATORY_TRACT | Status: DC
  Administered 2012-08-16 – 2012-08-18 (×3): 18 ug via RESPIRATORY_TRACT
  Filled 2012-08-15: qty 5

## 2012-08-15 NOTE — Progress Notes (Signed)
Occupational Therapy Evaluation Patient Details Name: Brad Jones MRN: [id number] DOB: 07-03-1927 Today's Date: 08/15/2012 Time: 0865-7846 OT Time Calculation (min): 41 min  OT Assessment / Plan / Recommendation History of present illness   79 y/o male with HTN and COPD was brought in by EMS unresponsive in the setting of severe CAP requiring intubation. Found to be in AKI and transferred to [facility] from [facility] ED on 7/12.       Clinical Impression   PTA, pt reports that he was mod I with mobility and ADL. Wife assisted as needed with LB ADL. Unsure of accuracy. Nsg reports supportive family. On eval, pt with significant functional decline and significant deficits with balance and functional use of RLE - pt reports sensory changes below the knee and states that his vision is different. Pt required Max A with static standing balance and max A to orient to midline. Max A to advance R foot during ambulation - exhibits ataxic gait pattern. Pt not oriented to date/time or place. If family can provide 24/7 assistance, recommend CIR prior to D/C home.     OT Assessment  Patient needs continued OT Services    Follow Up Recommendations  CIR    Barriers to Discharge Other (comment) (unsure of caregiver support)    Equipment Recommendations  Tub/shower bench    Recommendations for Other Services Rehab consult  Frequency  Min 2X/week    Precautions / Restrictions Precautions Precautions: Fall   Pertinent Vitals/Pain Increased O2 to 3L during activity due to desat to 86    ADL  Eating/Feeding: Supervision/safety;Set up Where Assessed - Eating/Feeding: Chair Grooming: Minimal assistance Where Assessed - Grooming: Supported sitting Upper Body Bathing: Minimal assistance Where Assessed - Upper Body Bathing: Supported sitting Lower Body Bathing: Maximal assistance Where Assessed - Lower Body Bathing: Supported sit to stand Upper Body Dressing: Moderate assistance Where Assessed - Upper Body  Dressing: Supported sitting Lower Body Dressing: Maximal assistance Where Assessed - Lower Body Dressing: Supported sit to Art therapist Transfer: +2 Total assistance;Simulated Toilet Transfer: Patient Percentage: 30% Equipment Used: Gait belt;Wheelchair Transfers/Ambulation Related to ADLs: +2 total A ADL Comments: functional decline    OT Diagnosis: Generalized weakness;Cognitive deficits;Disturbance of vision;Acute pain;Ataxia  OT Problem List: Decreased strength;Decreased activity tolerance;Impaired balance (sitting and/or standing);Impaired vision/perception;Decreased coordination;Decreased cognition;Decreased safety awareness;Decreased knowledge of use of DME or AE;Cardiopulmonary status limiting activity;Impaired sensation;Pain;Increased edema OT Treatment Interventions: Self-care/ADL training;Therapeutic exercise;Energy conservation;DME and/or AE instruction;Therapeutic activities;Cognitive remediation/compensation;Visual/perceptual remediation/compensation;Patient/family education;Balance training   OT Goals(Current goals can be found in the care plan section) Acute Rehab OT Goals Patient Stated Goal: to go home OT Goal Formulation: With patient Time For Goal Achievement: 08/29/12 Potential to Achieve Goals: Good  Visit Information  Last OT Received On: 08/15/12 PT/OT Co-Evaluation/Treatment: Yes       Prior Functioning     Home Living Family/patient expects to be discharged to:: Private residence Living Arrangements: Spouse/significant other Available Help at Discharge: Available 24 hours/day Type of Home: House Home Access: Stairs to enter [employer] of Steps: 3 Entrance Stairs-Rails: None Home Layout: One level Home Equipment: Environmental consultant - 2 wheels;Cane - single point;Bedside commode (unsure of accuracy) Prior Function Level of Independence: Needs assistance Gait / Transfers Assistance Needed: mod I Comments: wife assist as needed with LB  ADL Communication Communication: HOH Dominant Hand: Right         Vision/Perception Vision - History Baseline Vision: Wears glasses all the time Patient Visual Report: Blurring of vision;Other (comment) (pt reports recent "cloudy" vision  since admission) Vision - Assessment Eye Alignment: Within Functional Limits Vision Assessment: Vision not tested Additional Comments: will further assess if needed Perception Perception: Not tested   Cognition  Cognition Arousal/Alertness: Awake/alert Behavior During Therapy: WFL for tasks assessed/performed Overall Cognitive Status: No family/caregiver present to determine baseline cognitive functioning    Extremity/Trunk Assessment Upper Extremity Assessment Upper Extremity Assessment: Generalized weakness Lower Extremity Assessment Lower Extremity Assessment: Defer to PT evaluation Cervical / Trunk Assessment Cervical / Trunk Assessment: Kyphotic;Other exceptions Cervical / Trunk Exceptions: R and posterior bias     Mobility Bed Mobility Bed Mobility: Supine to Sit;Sitting - Scoot to Edge of Bed Supine to Sit: 4: Min assist;HOB elevated Sitting - Scoot to [place] of Bed: 4: Min assist;With rail Details for Bed Mobility Assistance: vc for problem solving Transfers Transfers: Sit to Stand;Stand to Sit Sit to Stand: 1: +2 Total assist;From bed;With upper extremity assist Sit to Stand: Patient Percentage: 30% Stand to Sit: 1: +2 Total assist;To chair/3-in-1 Stand to Sit: Patient Percentage: 30% Details for Transfer Assistance: heavy R and posterior lean     Exercise     Balance Balance Balance Assessed: Yes Static Sitting Balance Static Sitting - Balance Support: Feet supported;No upper extremity supported Static Sitting - Level of Assistance: 4: Min assist (R bias) Static Standing Balance Static Standing - Balance Support: Bilateral upper extremity supported Static Standing - Level of Assistance: 2: Max assist   End of  Session OT - End of Session Equipment Utilized During Treatment: Gait belt;Oxygen;Other (comment) (w/c) Activity Tolerance: Patient tolerated treatment well Patient left: in chair;with call bell/phone within reach Nurse Communication: Mobility status;Other (comment);Need for lift equipment (concerns over focal deficits with R LE and balance)  GO     Kidada Ging,HILLARY 08/15/2012, 1:11 PM [facility], OTR/L  [phone] 08/15/2012

## 2012-08-15 NOTE — Evaluation (Addendum)
Physical Therapy Evaluation Patient Details Name: Rodolfo Gaster MRN: [id number] DOB: 11-Aug-1927 Today's Date: 08/15/2012 Time: 4098-1191 PT Time Calculation (min): 31 min  PT Assessment / Plan / Recommendation History of Present Illness  79 y/o male with HTN and COPD was brought in by EMS unresponsive in the setting of severe CAP requiring intubation. Found to be in AKI and transferred to [facility] from [facility] ED on 7/12.    Clinical Impression  Patient presents with deficits in mobility due to weakness from illness, bedrest and question neuro changes versus encephalopathy related balance and coordination changes.  Patient will benefit from skilled PT in the acute setting to address deficits below and allow return home with family assist after inpatient rehab stay.  Will follow acutely.    PT Assessment  Patient needs continued PT services    Follow Up Recommendations  CIR    Does the patient have the potential to tolerate intense rehabilitation    yes  Barriers to Discharge Decreased caregiver support RN states wife with Alzheimer's and daughters assist PRN    Equipment Recommendations  None recommended by PT    Recommendations for Other Services Rehab consult   Frequency Min 3X/week    Precautions / Restrictions Precautions Precautions: Fall Precaution Comments: O2 dependent   Pertinent Vitals/Pain C/o pain right       Mobility  Bed Mobility Bed Mobility: Supine to Sit;Sitting - Scoot to Edge of Bed Supine to Sit: 4: Min assist;HOB elevated Sitting - Scoot to [place] of Bed: 4: Min assist;With rail Details for Bed Mobility Assistance: vc for problem solving Transfers Sit to Stand: 1: +2 Total assist;From bed;With upper extremity assist Sit to Stand: Patient Percentage: 30% Stand to Sit: 1: +2 Total assist;To chair/3-in-1 Stand to Sit: Patient Percentage: 30% Details for Transfer Assistance: heavy R and posterior lean Ambulation/Gait Ambulation/Gait Assistance: 1: +2 Total  assist Ambulation/Gait: Patient Percentage: 30% Ambulation Distance (Feet): 2 Feet Assistive device: Other (Comment) (pushing wheelchair) Ambulation/Gait Assistance Details: assist for balance due to right and posterior lean, assist to keep from tipping wheelchair and to keep anterior weight shift on feet; demosntrate ataxic pattern with scissoring right foot in front of left and inability to maintain upright posture Gait Pattern: Step-to pattern;Decreased stride length;Shuffle;Scissoring;Ataxic;Narrow base of support;Lateral trunk lean to right Modified Rankin (Stroke Patients Only) Pre-Morbid Rankin Score: No significant disability Modified Rankin: Severe disability        PT Diagnosis: Abnormality of gait;Generalized weakness  PT Problem List: Decreased strength;Decreased range of motion;Decreased activity tolerance;Decreased balance;Decreased mobility;Decreased coordination;Impaired sensation;Pain PT Treatment Interventions: DME instruction;Balance training;Gait training;Neuromuscular re-education;Functional mobility training;Patient/family education;Therapeutic activities;Therapeutic exercise;Stair training     PT Goals(Current goals can be found in the care plan section) Acute Rehab PT Goals Patient Stated Goal: to go home PT Goal Formulation: With patient Time For Goal Achievement: 08/29/12 Potential to Achieve Goals: Good  Visit Information  Last PT Received On: 08/15/12 Assistance Needed: +2 PT/OT Co-Evaluation/Treatment: Yes History of Present Illness: 79 y/o male with HTN and COPD was brought in by EMS unresponsive in the setting of severe CAP requiring intubation. Found to be in AKI and transferred to [facility] from [facility] ED on 7/12.         Prior Functioning  Home Living Family/patient expects to be discharged to:: Private residence Living Arrangements: Spouse/significant other Available Help at Discharge: Available 24 hours/day Type of Home: House Home Access: Stairs to  enter [employer] of Steps: 3 Entrance Stairs-Rails: None Home Layout: One level Home Equipment:  Walker - 2 wheels;Cane - single point;Bedside commode Prior Function Level of Independence: Needs assistance;Independent with assistive device(s) Gait / Transfers Assistance Needed: mod I Comments: wife assist as needed with LB ADL Communication Communication: HOH Dominant Hand: Right    Cognition  Cognition Arousal/Alertness: Awake/alert Behavior During Therapy: WFL for tasks assessed/performed Overall Cognitive Status: No family/caregiver present to determine baseline cognitive functioning    Extremity/Trunk Assessment Upper Extremity Assessment Upper Extremity Assessment: Defer to OT evaluation Lower Extremity Assessment Lower Extremity Assessment: LLE deficits/detail;RLE deficits/detail RLE Deficits / Details: AROM grossly WFL except ankle dorsiflexion limited by pain, stiffness, edema to approx -20 degrees, strength grossly 4/5, c/o sensory changes anterior lower leg ? psoriatic skin changes over both knees; decreased coordination with toe tapping as compared to left RLE Sensation: decreased light touch LLE Deficits / Details: AROM WFL, strength grossly 4/5 Cervical / Trunk Assessment Cervical / Trunk Assessment: Kyphotic;Other exceptions Cervical / Trunk Exceptions: R and posterior bias   Balance Balance Balance Assessed: Yes Static Sitting Balance Static Sitting - Balance Support: Feet supported;No upper extremity supported Static Sitting - Level of Assistance: 4: Min Oncologist Standing - Balance Support: Bilateral upper extremity supported Static Standing - Level of Assistance: 2: Max assist  End of Session PT - End of Session Equipment Utilized During Treatment: Gait belt Activity Tolerance: Patient limited by fatigue Patient left: in chair;with call bell/phone within reach Nurse Communication: Mobility status  GP      [facility] 08/15/2012, 2:46 PM Sheran Lawless, PT [phone] 08/15/2012

## 2012-08-15 NOTE — Progress Notes (Signed)
PULMONARY  / CRITICAL CARE MEDICINE  Name: Brad Jones MRN: [id number] DOB: 03/24/1927    ADMISSION DATE:  08/09/2012 CONSULTATION DATE:  08/10/2012  REFERRING MD :  [facility] EDP PRIMARY SERVICE: PCCM  CHIEF COMPLAINT:  Acute respiratory failure  BRIEF PATIENT DESCRIPTION: 79 y/o male with HTN and COPD was brought in by EMS unresponsive in the setting of severe CAP requiring intubation. Found to be in AKI and transferred to [facility] from [facility] ED on 7/12.  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   LINES / TUBES: ETT 7/12 >>> 7/17 R IJ 7/14 >>> 7/17 Foley   CULTURES: 7/12 blood >>> GPC chains >>>Strep pneumonia>>>pan sensitive 7/13 resp >>> strep pneumo pan sensitive, some yeast 7/13 urine strep >>> Positive 7/13 Resp Virus Multiplex PCR >>> 7/13 urine legionella >>>negative 7/13 urine >>> no growth  ANTIBIOTICS: 7/12 ceftriaxone >>7/13  7/12 azithro >> 7/14 7/13 Vanc >>> 7/15 7/13 Zosyn >>>7/18 7/18 Amox>>> for 2 more days  SUBJECTIVE: no complaints today  VITAL SIGNS: Temp:  [97.6 F (36.4 C)-98.8 F (37.1 C)] 98.4 F (36.9 C) (07/18 0811) Pulse Rate:  [79-158] 86 (07/18 0600) Resp:  [20-27] 23 (07/18 0600) BP: (118-176)/(64-107) 146/79 mmHg (07/18 0600) SpO2:  [93 %-100 %] 98 % (07/18 0739) Weight:  [182 lb 1.6 oz (82.6 kg)] 182 lb 1.6 oz (82.6 kg) (07/18 0500) HEMODYNAMICS:   VENTILATOR SETTINGS:   INTAKE / OUTPUT: Intake/Output     07/17 0701 - 07/18 0700 07/18 0701 - 07/19 0700   P.O. 360    I.V. (mL/kg) 385 (4.7)    NG/GT 310    IV Piggyback 200    Total Intake(mL/kg) 1255 (15.2)    Urine (mL/kg/hr) 7050 (3.6)    Stool 150 (0.1)    Total Output 7200      Net -5945            PHYSICAL EXAMINATION: General:  NAD Neuro:  Speech intact. Oriented to person and year, otherwise not oriented HEENT:  NCAT, [place] in place Cardiovascular:  RRR Lungs:  bilat breath sounds present, clear Abdomen:  Soft, NTTP, +BS Musculoskeletal:  Ext atraumatic Skin:  trace pitting edema bilat shins  LABS:  Recent Labs Lab 08/09/12 2000  08/10/12 0200  08/10/12 0818 08/10/12 1225  08/10/12 1716 08/10/12 2212 08/10/12 2214 08/11/12 0152  08/11/12 0400 08/11/12 0414 08/11/12 0856 08/11/12 1630 08/12/12 0400 08/12/12 1248 08/13/12 0423 08/13/12 1053 08/14/12 0415 08/14/12 0959 08/15/12 0425  HGB 13.4  --  12.4*  --   --   --   --   --   --   --   --   --  12.0*  --   --   --  10.1*  --  10.9*  --  11.5*  --  12.1*  WBC 30.1*  --  19.2*  --   --   --   --   --   --   --   --   --  16.7*  --   --   --  11.7*  --  11.3*  --  10.2  --  11.0*  PLT 109*  --  94*  --   --   --   --   --   --   --   --   --  97*  --   --   --  84*  --  80*  --  94*  --  106*  NA 144  --  141  --   --   --   --   --   --   --   --   --  142  --   --   --  144  --  147*  --  149*  --  151*  K 4.7  --  4.1  --   --   --   --   --   --   --   --   --  4.9  --   --   --  3.8  --  4.2  --  3.6  --  3.8  CL 104  --  106  --   --   --   --   --   --   --   --   --  109  --   --   --  111  --  114*  --  111  --  106  CO2 27  --  25  --   --   --   --   --   --   --   --   --  25  --   --   --  23  --  24  --  28  --  37*  GLUCOSE 132*  --  131*  --   --   --   --   --   --   --   --   --  81  --   --   --  126*  --  118*  --  114*  --  101*  BUN 39*  --  45*  --   --   --   --   --   --   --   --   --  61*  --   --   --  67*  --  69*  --  75*  --  71*  CREATININE 4.86*  --  4.36*  --   --   --   --   --   --   --   --   --  4.47*  --   --   --  4.48*  --  4.40*  --  4.50*  --  4.81*  CALCIUM 9.6  --  9.0  --   --   --   --   --   --   --   --   --  8.6  --   --   --  8.6  --  8.8  --   9.4  --  9.5  MG  --   --   --   --   --   --   --   --   --   --   --   --  1.6  --   --   --   --   --   --   --   --   --   --   PHOS  --   --   --   --   --   --   --   --   --   --   --   --  3.1  --   --   --   --   --   --   --   --   --   --  AST 10  --   --   --   --   --   --   --   --   --   --   --  13  --   --   --   --   --   --   --   --   --   --   ALT 6  --   --   --   --   --   --   --   --   --   --   --  8  --   --   --   --   --   --   --   --   --   --   ALKPHOS 58  --   --   --   --   --   --   --   --   --   --   --  48  --   --   --   --   --   --   --   --   --   --   BILITOT 0.5  --   --   --   --   --   --   --   --   --   --   --  0.5  --   --   --   --   --   --   --   --   --   --   PROT 7.0  --   --   --   --   --   --   --   --   --   --   --  5.9*  --   --   --   --   --   --   --   --   --   --   ALBUMIN 2.9*  --   --   --   --   --   --   --   --   --   --   --  2.1*  --   --   --   --   --   --   --   --   --   --   APTT  --   --   --   --   --   --   --   --   --   --   --   --   --   --   --  37  --   --   --   --   --   --   --   LATICACIDVEN  --   < > 4.4*  --  2.6*  --   < >  --   --  1.3 1.1  --   --   --  1.6  --   --   --   --   --   --   --   --   TROPONINI <0.30  --   --   --   --   --   --  0.82* 0.76*  --   --   --   --   --  0.59*  --   --   --   --   --   --   --   --   PROCALCITON  --   --   --   --   --   --   --  117.71  --   --   --   --   --  112.01  --   --  76.15  --   --   --   --   --   --   PROBNP  --   --   --   --   --  23747.0*  --   --   --   --   --   --   --  10756.0*  --   --   --   --   --   --   --   --   --   PHART  --   < >  --   < >  --   --   --   --   --   --   --   < >  --   --   --   --   --  7.163*  --  7.218*  --  7.341*  --   PCO2ART  --   < >  --   < >  --   --   --   --   --   --   --   < >  --   --   --   --   --  65.2*  --  56.9*  --  52.1*  --   PO2ART  --   < >  --   < >  --   --   --   --   --   --   --   < >  --    --   --   --   --  79.0*  --  72.0*  --  80.0  --   < > = values in this interval not displayed.  Recent Labs Lab 08/14/12 0345 08/14/12 0818 08/14/12 1129 08/14/12 1649 08/14/12 2013  GLUCAP 103* 102* 169* 93 146*    CXR:  improved aeration   ASSESSMENT / PLAN:  PULMONARY A:Acute respiratory failure due to severe CAP, resolving CT chest with consolidated PNA R concerning for aspiration, necrotizing process COPD Component edema P:  -successfully extubated 7/17 -neg balance goals, lasix dose decreased yesterday with still 7 L urine output   CARDIOVASCULAR A: Hypotension resolved Septic Shock Bacteremia  Hypertension improved on bb Elevated lactate, downtrending Elevated troponin Hx CHF, ProBNP markedly elevated Severe pulmonary HTN P:  -lasix 40 q12h with good response, see renal re: d/c lasix -KVO IVs -daily aspirin -continue metoprolol  RENAL A: Acute on chronic kidney disease (baseline unclear, but at least 2) Pt does not want dialysis under ANY circumstances = death Hypernatremia likely secondary to lasix P:  -foley in place  -KVO IV -Cr at baseline likely -renal u/s no hydro or acute finding -d/c lasix today given excellent response, hypernatremia  GASTROINTESTINAL A: no acute issues P:  -Pantoprazole for stress ulcer prophylaxis -ADAT  HEMATOLOGIC A: Thrombocytopenia, leukocytosis Anemia P:  -sub q hep for dvt prophylaxis, watch platelets -trend CBC  INFECTIOUS A: Severe CAP R>L 7/13 BC w/ GPC in chains - strep pneumo Urine strep antigen positive P:  -d/c zosyn, transition to oral amoxicillin  ENDOCRINE A: DM -diet controlled  P:  -monitor glucose  -d/c ssi for now, RN to call if cbg's >200  NEUROLOGIC A: Acute encephalopathy due to severe sepsis and respiratory failure  P:  -monitor mental status  TODAY'S SUMMARY: d/c lasix, transfer to floor, change to  amoxicillin  Transfer of patient to med-surg ordered. Signed patient out  to [facility] attending Dr. Susie Cassette. PCCM will sign off in AM 7/19. Please reconsult if further needs arise.  Levert Feinstein, MD Family Medicine PGY-2   Pulmonary and Critical Care Medicine [facility] Pager: [phone]  08/15/2012, 8:15 AM   I have interviewed and examined the patient and reviewed the database. I have formulated the assessment and plan as reflected in the note above with amendments made by me.    Billy Fischer, MD;  PCCM service; Mobile [phone]

## 2012-08-15 NOTE — Consult Note (Signed)
Physical Medicine and Rehabilitation Consult  Reason for Consult: Deconditioned due to PNA, new visual deficits, and ataxia.  Referring Physician:  Dr. Sung Amabile.    HPI: Brad Jones is a 79 y.o. male with history of HTN, DM, COPD, CKD; admitted on 08/10/12 with h/o malaise, syncope leading to unresponsiveness in setting of severe CAP and acute on chronic renal failure. Patient intubated at [facility] ED and transferred to [facility] for treatment. He was treated with IV antibiotics for sepsis due to strep bacteremia. CT head with global atrophy with right frontal lobe infarct of indeterminate age and moderate SVD. CT chest with multifocal PNA with consolidation concerning for aspiration.  2D echo with EF 50-55% with grade 1 diastolic dysfunction and severe pulmonary HTN. Extubated on 07/17 and encephalopathy due to sepsis resolving.   Fluid overload treated with diuresis. OT evaluation done today revealing ataxia, impaired balance and reports of visual deficits. MD, OT recommending CIR.    Review of Systems  HENT: Negative for hearing loss.   Eyes: Positive for blurred vision (macular degeneration with declining vision).  Respiratory: Positive for cough.   Cardiovascular: Negative for chest pain and palpitations.  Gastrointestinal: Negative for heartburn and nausea.  Musculoskeletal: Negative for back pain and joint pain.       Balance problems.  Neurological: Negative for headaches.   Past Medical History  Diagnosis Date  . Hypertension   . CHF (congestive heart failure)   . COPD (chronic obstructive pulmonary disease)   . Emphysema   . Diabetes mellitus without complication     non insulin dependent  . Cancer     prostate  . Renal disorder kidneys only function at 20%    pt does not want dialysis  . Stroke     TIA, multiple   Past Surgical History  Procedure Laterality Date  . Prostatectomy     History reviewed. No pertinent family history.  Social History:  Married. Lives in [place].  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 He  reports that he has quit smoking about 30 years ago. He does not have any smokeless tobacco history on file. He reports that he does not drink alcohol. His drug history is not on file.  Allergies: No Known Allergies  Medications Prior to Admission  Medication Sig Dispense Refill  . albuterol (PROVENTIL HFA;VENTOLIN HFA) 108 (90 BASE) MCG/ACT inhaler Inhale 2 puffs into the lungs every 6 (six) hours as needed for wheezing.      Marland Kitchen aspirin 81 MG tablet Take 81 mg by mouth daily.      . calcitRIOL (ROCALTROL) 0.25 MCG capsule Take 0.25 mcg by mouth daily.      Marland Kitchen docusate sodium (COLACE) 50 MG capsule Take by mouth at bedtime.      . fluocinonide ointment (LIDEX) 0.05 % Apply topically 2 (two) times daily.      . furosemide (LASIX) 40 MG tablet Take 40 mg by mouth daily.      Marland Kitchen HYDROcodone-acetaminophen (NORCO/VICODIN) 5-325 MG per tablet Take 1 tablet by mouth every 6 (six) hours as needed for pain.      . metoprolol (TOPROL-XL) 200 MG 24 hr tablet Take 200 mg by mouth daily.      . nitroGLYCERIN (NITROSTAT) 0.3 MG SL tablet Place 0.3 mg under the tongue every 5 (five) minutes as needed for chest pain.      Marland Kitchen terazosin (HYTRIN) 2 MG capsule Take 2 mg by mouth at bedtime.      Marland Kitchen  tiotropium (SPIRIVA) 18 MCG inhalation capsule Place 18 mcg into inhaler and inhale daily.        Home: Home Living Family/patient expects to be discharged to:: Private residence Living Arrangements: Spouse/significant other Available Help at Discharge: Available 24 hours/day Type of Home: House Home Access: Stairs to enter [employer] of Steps: 3 Entrance Stairs-Rails: None Home Layout: One level Home Equipment: Environmental consultant - 2 wheels;Cane - single point;Bedside commode (unsure of accuracy)  Functional History: Prior Function Comments: wife assist as needed with LB ADL Functional Status:  Mobility: Bed Mobility Bed Mobility: Supine to Sit;Sitting - Scoot  to Edge of Bed Supine to Sit: 4: Min assist;HOB elevated Sitting - Scoot to [place] of Bed: 4: Min assist;With rail Transfers Sit to Stand: 1: +2 Total assist;From bed;With upper extremity assist Sit to Stand: Patient Percentage: 30% Stand to Sit: 1: +2 Total assist;To chair/3-in-1 Stand to Sit: Patient Percentage: 30%      ADL: ADL Eating/Feeding: Supervision/safety;Set up Where Assessed - Eating/Feeding: Chair Grooming: Minimal assistance Where Assessed - Grooming: Supported sitting Upper Body Bathing: Minimal assistance Where Assessed - Upper Body Bathing: Supported sitting Lower Body Bathing: Maximal assistance Where Assessed - Lower Body Bathing: Supported sit to stand Upper Body Dressing: Moderate assistance Where Assessed - Upper Body Dressing: Supported sitting Lower Body Dressing: Maximal assistance Where Assessed - Lower Body Dressing: Supported sit to stand Toilet Transfer: +2 Total assistance;Simulated Equipment Used: Gait belt;Wheelchair Transfers/Ambulation Related to ADLs: +2 total A ADL Comments: functional decline  Cognition: Cognition Overall Cognitive Status: No family/caregiver present to determine baseline cognitive functioning Orientation Level: Oriented to person;Oriented to place;Disoriented to time Cognition Arousal/Alertness: Awake/alert Behavior During Therapy: WFL for tasks assessed/performed Overall Cognitive Status: No family/caregiver present to determine baseline cognitive functioning  Blood pressure 106/87, pulse 72, temperature 98.5 F (36.9 C), temperature source Oral, resp. rate 20, height 5\' 8"  (1.727 m), weight 82.6 kg (182 lb 1.6 oz), SpO2 98.00%.   Physical Exam  Nursing note and vitals reviewed. Constitutional: He appears well-developed and well-nourished.  Elderly, frail appearing male  HENT:  Head: Normocephalic and atraumatic.  Eyes: Conjunctivae and EOM are normal. Pupils are equal, round, and reactive to light.  Neck: Normal  range of motion. Neck supple.  Cardiovascular: Normal rate and regular rhythm.   Pulmonary/Chest: Effort normal. No respiratory distress. He has wheezes.  Congested cough.  Abdominal: Soft. Bowel sounds are normal. He exhibits no distension. There is no tenderness.  Musculoskeletal: He exhibits no edema and no tenderness.  Bilateral knees with large psoriatic patches. No pain with ROM.  Neurological: He is alert.  Oriented to self. Place "[facility]"-- transfer from [place] in [place]. Disoriented but able to follow basic commands. Ataxia with finger to left greater than right. RUE 4/5. LUE is 3+/5. LLE is 2/5 prox to 4- at ankle. RLE is 3/5 prox to 4/5 distally. Sensation grossly intact in all 4. Speech dysarthric. Follows most simple one step commands.   Skin: Skin is warm and dry.  Psychiatric: He has a normal mood and affect.    Results for orders placed during the hospital encounter of 08/09/12 (from the past 24 hour(s))  GLUCOSE, CAPILLARY     Status: None   Collection Time    08/14/12  4:49 PM      Result Value Range   Glucose-Capillary 93  70 - 99 mg/dL  GLUCOSE, CAPILLARY     Status: Abnormal   Collection Time    08/14/12  8:13 PM  Result Value Range   Glucose-Capillary 146 (*) 70 - 99 mg/dL  BASIC METABOLIC PANEL     Status: Abnormal   Collection Time    08/15/12  4:25 AM      Result Value Range   Sodium 151 (*) 135 - 145 mEq/L   Potassium 3.8  3.5 - 5.1 mEq/L   Chloride 106  96 - 112 mEq/L   CO2 37 (*) 19 - 32 mEq/L   Glucose, Bld 101 (*) 70 - 99 mg/dL   BUN 71 (*) 6 - 23 mg/dL   Creatinine, Ser 1.61 (*) 0.50 - 1.35 mg/dL   Calcium 9.5  8.4 - 09.6 mg/dL   GFR calc non Af Amer 10 (*) >90 mL/min   GFR calc Af Amer 12 (*) >90 mL/min  CBC     Status: Abnormal   Collection Time    08/15/12  4:25 AM      Result Value Range   WBC 11.0 (*) 4.0 - 10.5 K/uL   RBC 4.97  4.22 - 5.81 MIL/uL   Hemoglobin 12.1 (*) 13.0 - 17.0 g/dL   HCT 04.5  40.9 - 81.1 %   MCV 80.9  78.0 -  100.0 fL   MCH 24.3 (*) 26.0 - 34.0 pg   MCHC 30.1  30.0 - 36.0 g/dL   RDW 91.4  78.2 - 95.6 %   Platelets 106 (*) 150 - 400 K/uL  GLUCOSE, CAPILLARY     Status: Abnormal   Collection Time    08/15/12 11:35 AM      Result Value Range   Glucose-Capillary 122 (*) 70 - 99 mg/dL   Dg Chest Port 1 View  08/15/2012   *RADIOLOGY REPORT*  Clinical Data: Post extubation, evaluate lung fields  PORTABLE CHEST - 1 VIEW  Comparison: 08/14/2012; 08/13/2012; 08/11/2012; chest CT - 08/11/2012  Findings:  Grossly unchanged enlarged cardiac silhouette and mediastinal contours with tortuosity of the thoracic aorta.  Interval removal of support apparatus.  No definite pneumothorax.  Bibasilar coarse heterogeneous air space opacities are grossly unchanged.  Grossly unchanged linear heterogeneous opacity in the right mid lung.  No new focal airspace opacities.  No definite pleural effusion. Unchanged bones.  IMPRESSION: 1.  Interval removal of support apparatus.  No pneumothorax. 2.  Grossly unchanged extensive bibasilar and right mid lung coarse heterogeneous air space opacities worrisome for multifocal infection.  A follow-up chest radiograph in 4 to 6 weeks after treatment is recommended to ensure resolution.   Original Report Authenticated By: Tacey Ruiz, MD   Dg Chest Port 1 View  08/14/2012   *RADIOLOGY REPORT*  Clinical Data: Evaluate lung fields and position of endotracheal tube  PORTABLE CHEST - 1 VIEW  Comparison: Portable chest of 08/13/2012  Findings: There has been further improvement in airspace disease with persistent small effusions and right mid lung atelectasis. The endotracheal tube is unchanged in position as is the right central venous line.  IMPRESSION: Slightly improved aeration with some improvement in airspace disease.   Original Report Authenticated By: Dwyane Dee, M.D.    Assessment/Plan: Diagnosis: deconditioning related to sepsis. 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 Does the need for  close, 24 hr/day medical supervision in concert with the patient's rehab needs make it unreasonable for this patient to be served in a less intensive setting? Yes 2. Co-Morbidities requiring supervision/potential complications: acute renal failure 3. Due to bladder management, bowel management, safety, skin/wound care, disease management, medication administration, pain management and patient  education, does the patient require 24 hr/day rehab nursing? Yes 4. Does the patient require coordinated care of a physician, rehab nurse, PT (1-2 hrs/day, 5 days/week), OT (1-2 hrs/day, 5 days/week) and SLP (? hrs/day, ? days/week) to address physical and functional deficits in the context of the above medical diagnosis(es)? Yes Addressing deficits in the following areas: balance, endurance, locomotion, strength, transferring, bowel/bladder control, bathing, dressing, feeding, grooming, toileting, cognition, speech, swallowing and psychosocial support 5. Can the patient actively participate in an intensive therapy program of at least 3 hrs of therapy per day at least 5 days per week? Yes and Potentially 6. The potential for patient to make measurable gains while on inpatient rehab is excellent 7. Anticipated functional outcomes upon discharge from inpatient rehab are supervision with PT, supervision to minimal assist with OT, supervision to mod I with SLP. 8. Estimated rehab length of stay to reach the above functional goals is: 2-3 weeks 9. Does the patient have adequate social supports to accommodate these discharge functional goals? Potentially 10. Anticipated D/C setting: Home 11. Anticipated post D/C treatments: HH therapy 12. Overall Rehab/Functional Prognosis: excellent  RECOMMENDATIONS: This patient's condition is appropriate for continued rehabilitative care in the following setting: CIR Patient has agreed to participate in recommended program. Yes and Potentially Note that insurance prior  authorization may be required for reimbursement for recommended care.  Comment: Need to follow up on social supports. 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 It sounds as if daughters may be able to provide assistance at discharge. Rehab RN to follow up.   Ranelle Oyster, MD, [place] [facility]     08/15/2012

## 2012-08-15 NOTE — Progress Notes (Signed)
Rehab Admissions Coordinator Note:  Patient was screened by Clois Dupes for appropriateness for an Inpatient Acute Rehab Consult.  At this time, we are recommending Inpatient Rehab consult.  Clois Dupes 08/15/2012, 1:37 PM  I can be reached at [phone].

## 2012-08-16 LAB — BASIC METABOLIC PANEL
Calcium: 9.2 mg/dL (ref 8.4–10.5)
GFR calc non Af Amer: 9 mL/min — ABNORMAL LOW (ref >90)
Glucose, Bld: 99 mg/dL (ref 70–99)
Sodium: 150 mEq/L — ABNORMAL HIGH (ref 135–145)

## 2012-08-16 LAB — GLUCOSE, CAPILLARY: Glucose-Capillary: 89 mg/dL (ref 70–99)

## 2012-08-16 LAB — CBC
MCH: 24.9 pg — ABNORMAL LOW (ref 26.0–34.0)
MCHC: 30.6 g/dL (ref 30.0–36.0)
Platelets: 127 10*3/uL — ABNORMAL LOW (ref 150–400)
RBC: 4.86 MIL/uL (ref 4.22–5.81)

## 2012-08-16 MED ORDER — SODIUM CHLORIDE 0.45 % IV SOLN
INTRAVENOUS | Status: AC
  Administered 2012-08-16: 1000 mL via INTRAVENOUS

## 2012-08-16 MED ORDER — POTASSIUM CHLORIDE CRYS ER 20 MEQ PO TBCR
40.0000 meq | EXTENDED_RELEASE_TABLET | Freq: Once | ORAL | Status: AC
  Administered 2012-08-16: 40 meq via ORAL
  Filled 2012-08-16: qty 2

## 2012-08-16 NOTE — Progress Notes (Signed)
Physical Therapy Treatment Patient Details Name: Brad Jones MRN: [id number] DOB: 1927/06/01 Today's Date: 08/16/2012 Time: 4098-1191 PT Time Calculation (min): 25 min  PT Assessment / Plan / Recommendation  PT Comments   Pt treatment limited to bed mobility and positioning secondary to pt received with rectal pouch leakage. Patient able to perform multiple log rolls in bed with minimal assist and some verbal cues. Patient hygiene performed.  Patient with increased fatigue from various positioning required for hygiene therefore limiting therapy session. Will continue to work with patient and progress activity as tolerated. Nsg aware of issues with rectal pouch.  Follow Up Recommendations  CIR     Does the patient have the potential to tolerate intense rehabilitation     Barriers to Discharge        Equipment Recommendations  None recommended by PT    Recommendations for Other Services Rehab consult  Frequency Min 3X/week   Progress towards PT Goals    Plan Current plan remains appropriate    Precautions / Restrictions Precautions Precautions: Fall Precaution Comments: O2 dependent Restrictions Weight Bearing Restrictions: No   Pertinent Vitals/Pain Pt reports no pain at this time    Mobility  Bed Mobility Bed Mobility: Rolling Right;Rolling Left;Scooting to [facility] Rolling Right: 4: Min assist Rolling Left: 4: Min assist Scooting to [facility]: 3: Mod assist Details for Bed Mobility Assistance: VCs for hand placement using rails Transfers Transfers: Not assessed Details for Transfer Assistance: pt required significant hygiene, unable to perform OOB activity at this time      PT Goals (current goals can now be found in the care plan section) Acute Rehab PT Goals Patient Stated Goal: to go home PT Goal Formulation: With patient Time For Goal Achievement: 08/29/12 Potential to Achieve Goals: Good  Visit Information  Last PT Received On: 08/16/12 Assistance Needed: +2 PT/OT  Co-Evaluation/Treatment: Yes History of Present Illness: 79 y/o male with HTN and COPD was brought in by EMS unresponsive in the setting of severe CAP requiring intubation. Found to be in AKI and transferred to [facility] from [facility] ED on 7/12.      Subjective Data  Subjective: I need to be cleaned up Patient Stated Goal: to go home   Cognition  Cognition Arousal/Alertness: Awake/alert Behavior During Therapy: WFL for tasks assessed/performed Overall Cognitive Status: No family/caregiver present to determine baseline cognitive functioning       End of Session PT - End of Session Activity Tolerance: Patient limited by fatigue Patient left: in bed;with call bell/phone within reach Nurse Communication: Other (comment) (Pt may need to have rectal pouch changed for leakage)   GP     Fabio Asa 08/16/2012, 3:27 PM Charlotte Crumb, PT DPT  [phone]

## 2012-08-16 NOTE — Progress Notes (Signed)
PATIENT DETAILS Name: Brad Jones Age: 79 y.o. Sex: male Date of Birth: 1927-04-04 Admit Date: 08/09/2012 Admitting Physician Lupita Leash, MD [id number], Provider, MD  Subjective: No major complaints  Assessment/Plan: Principal Problem:   Acute respiratory failure with hypoxia -2/2 Comm Acq PNA -required intubation/mech ventilation on admission, Extubated on 7/17 -currently doing well with just 2 L of O2 via [place]  Active Problems: Septic Shock -2/2 PNA/Strep Pneumoniae Bacteremia -resolved-initially required Central Line placement and pressors -BP stable-infact has been resumed on Metoprolol  Severe Comm Acq PNA  -Both Sputum and Blood cultures positive for Strep Pneumoniae Bacteremia -on admission required broad spectrum antibiotics-currently narrowed down to Amoxicillin till 7/20 Culture data as follows 7/12 blood >>> GPC chains >>>Strep pneumonia>>>pan sensitive  7/13 resp >>> strep pneumo pan sensitive, some yeast  7/13 urine strep >>> Positive  7/13 Resp Virus Multiplex PCR >>>  7/13 urine legionella >>>negative  7/13 urine >>> no growth  Acute Encephalopathy -Resolved -2/2 PNA/Septic Shock/Resp Failure -CT Head on admission neg for acute abnormalities  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 -ATN 2/2 Septic Shock -suspect in polyuric phase -Lasix stopped by PCCM on 5/18 -strict Intake and Output -will start gentle hydration overnight-given worsening creatinine and hypernatremia -per PCCM note-patient does not want HD under any circumstance  Hypernatremia -encourage PO intake -gently hydrate overnight only  Thrombocytopenia/Anemia -2/2 critical illness -resolving  COPD -lungs clear -stable currently -c/w Spiriva -prn Albuterol  HTN -BP now stable with Metoprolol  Disposition: Remain inpatient  DVT Prophylaxis: Prophylactic Heparin   Code Status: Full code   Family Communication None at bedside  Procedures:  None  CONSULTS:   None   MEDICATIONS: Scheduled Meds: . amoxicillin  500 mg Oral Q12H  . antiseptic oral rinse  15 mL Mouth Rinse BID  . aspirin  81 mg Per Tube Daily  . heparin  5,000 Units Subcutaneous Q8H  . metoprolol tartrate  50 mg Oral BID  . multivitamin with minerals  1 tablet Per Tube Daily  . mupirocin ointment  1 application Nasal BID  . tiotropium  18 mcg Inhalation Daily   Continuous Infusions:  PRN Meds:.albuterol  Antibiotics: Anti-infectives   Start     Dose/Rate Route Frequency Ordered Stop   08/15/12 1045  amoxicillin (AMOXIL) capsule 500 mg     500 mg Oral Every 12 hours 08/15/12 1013 08/17/12 0959   08/10/12 2200  azithromycin (ZITHROMAX) 500 mg in dextrose 5 % 250 mL IVPB  Status:  Discontinued     500 mg 250 mL/hr over 60 Minutes Intravenous Every 24 hours 08/10/12 0109 08/11/12 0956   08/10/12 1800  piperacillin-tazobactam (ZOSYN) IVPB 2.25 g  Status:  Discontinued     2.25 g 100 mL/hr over 30 Minutes Intravenous 4 times per day 08/10/12 1213 08/15/12 1013   08/10/12 1215  vancomycin (VANCOCIN) 1,500 mg in sodium chloride 0.9 % 500 mL IVPB  Status:  Discontinued     1,500 mg 250 mL/hr over 120 Minutes Intravenous Every 48 hours 08/10/12 1213 08/13/12 0805   08/10/12 1000  cefTRIAXone (ROCEPHIN) injection 1 g  Status:  Discontinued     1 g Intramuscular Every 12 hours 08/10/12 0109 08/10/12 0115   08/10/12 1000  cefTRIAXone (ROCEPHIN) 1 g in dextrose 5 % 50 mL IVPB  Status:  Discontinued     1 g 100 mL/hr over 30 Minutes Intravenous Every 12 hours 08/10/12 0115 08/10/12 1149   08/09/12 2045  cefTRIAXone (ROCEPHIN) 1 g in dextrose 5 %  50 mL IVPB     1 g 100 mL/hr over 30 Minutes Intravenous  Once 08/09/12 2034 08/09/12 2125   08/09/12 2045  azithromycin (ZITHROMAX) 500 mg in dextrose 5 % 250 mL IVPB     500 mg 250 mL/hr over 60 Minutes Intravenous  Once 08/09/12 2034 08/09/12 2231       PHYSICAL EXAM: Vital signs in last 24 hours: Filed Vitals:   08/15/12 2126  08/15/12 2216 08/16/12 0550 08/16/12 1346  BP: 132/74 148/89 115/71 120/65  Pulse: 72 69 68 78  Temp:  98.3 F (36.8 C) 98.3 F (36.8 C) 98.5 F (36.9 C)  TempSrc:  Oral Oral Oral  Resp:  18  18  Height:      Weight:      SpO2:  99% 99%     Weight change:  Filed Weights   08/13/12 0400 08/14/12 0400 08/15/12 0500  Weight: 95.6 kg (210 lb 12.2 oz) 89.4 kg (197 lb 1.5 oz) 82.6 kg (182 lb 1.6 oz)   Body mass index is 27.69 kg/(m^2).   Gen Exam: Awake and alert with clear speech.   Neck: Supple, No JVD.   Chest: B/L Clear.   CVS: S1 S2 Regular, no murmurs.  Abdomen: soft, BS +, non tender, non distended.  Extremities: no edema, lower extremities warm to touch. Neurologic: Non Focal.  Skin: No Rash. Psoriasis plaque in ant b/l knees  Wounds: N/A.    Intake/Output from previous day:  Intake/Output Summary (Last 24 hours) at 08/16/12 1410 Last data filed at 08/16/12 1347  Gross per 24 hour  Intake    600 ml  Output   1940 ml  Net  -1340 ml     LAB RESULTS: CBC  Recent Labs Lab 08/09/12 2000  08/12/12 0400 08/13/12 0423 08/14/12 0415 08/15/12 0425 08/16/12 0530  WBC 30.1*  < > 11.7* 11.3* 10.2 11.0* 9.5  HGB 13.4  < > 10.1* 10.9* 11.5* 12.1* 12.1*  HCT 45.5  < > 31.3* 33.3* 35.3* 40.2 39.6  PLT 109*  < > 84* 80* 94* 106* 127*  MCV 87.8  < > 77.9* 78.4 78.3 80.9 81.5  MCH 25.9*  < > 25.1* 25.6* 25.5* 24.3* 24.9*  MCHC 29.5*  < > 32.3 32.7 32.6 30.1 30.6  RDW 15.7*  < > 15.3 15.4 15.3 15.4 15.3  LYMPHSABS 0.6*  --   --   --   --   --   --   MONOABS 0.3  --   --   --   --   --   --   EOSABS 0.0  --   --   --   --   --   --   BASOSABS 0.0  --   --   --   --   --   --   < > = values in this interval not displayed.  Chemistries   Recent Labs Lab 08/10/12 0200 08/11/12 0400 08/12/12 0400 08/13/12 0423 08/14/12 0415 08/15/12 0425 08/16/12 0530  NA 141 142 144 147* 149* 151* 150*  K 4.1 4.9 3.8 4.2 3.6 3.8 3.4*  CL 106 109 111 114* 111 106 103  CO2 25  25 23 24 28  37* 38*  GLUCOSE 131* 81 126* 118* 114* 101* 99  BUN 45* 61* 67* 69* 75* 71* 70*  CREATININE 4.36* 4.47* 4.48* 4.40* 4.50* 4.81* 5.10*  CALCIUM 9.0 8.6 8.6 8.8 9.4 9.5 9.2  MG  --  1.6  --   --   --   --   --  CBG:  Recent Labs Lab 08/14/12 2013 08/15/12 1135 08/15/12 1648 08/16/12 0735 08/16/12 1158  GLUCAP 146* 122* 123* 89 85    GFR Estimated Creatinine Clearance: 11.7 ml/min (by C-G formula based on Cr of 5.1).  Coagulation profile No results found for this basename: INR, PROTIME,  in the last 168 hours  Cardiac Enzymes  Recent Labs Lab 08/10/12 1716 08/10/12 2212 08/11/12 0856  TROPONINI 0.82* 0.76* 0.59*    No components found with this basename: POCBNP,  No results found for this basename: DDIMER,  in the last 72 hours No results found for this basename: HGBA1C,  in the last 72 hours No results found for this basename: CHOL, HDL, LDLCALC, TRIG, CHOLHDL, LDLDIRECT,  in the last 72 hours No results found for this basename: TSH, T4TOTAL, FREET3, T3FREE, THYROIDAB,  in the last 72 hours No results found for this basename: VITAMINB12, FOLATE, FERRITIN, TIBC, IRON, RETICCTPCT,  in the last 72 hours No results found for this basename: LIPASE, AMYLASE,  in the last 72 hours  Urine Studies No results found for this basename: UACOL, UAPR, USPG, UPH, UTP, UGL, UKET, UBIL, UHGB, UNIT, UROB, ULEU, UEPI, UWBC, URBC, UBAC, CAST, CRYS, UCOM, BILUA,  in the last 72 hours  MICROBIOLOGY: Recent Results (from the past 240 hour(s))  CULTURE, BLOOD (ROUTINE X 2)     Status: None   Collection Time    08/09/12  8:19 PM      Result Value Range Status   Specimen Description LEFT ANTECUBITAL   Final   Special Requests BOTTLES DRAWN AEROBIC AND ANAEROBIC 8CC   Final   Culture  Setup Time 08/09/2012 20:19   Final   Culture     Final   Value: STREPTOCOCCUS PNEUMONIAE     Note: SUSCEPTIBILITIES PERFORMED ON PREVIOUS CULTURE WITHIN THE LAST 5 DAYS.     Note: Gram  Stain Report Called to,Read Back By and Verified With: BECK A 08/10/12 0945 BY THOMPSONS Performed at [facility]   Report Status 08/12/2012 FINAL   Final  CULTURE, BLOOD (ROUTINE X 2)     Status: None   Collection Time    08/09/12  8:28 PM      Result Value Range Status   Specimen Description BLOOD RIGHT WRIST   Final   Special Requests BOTTLES DRAWN AEROBIC AND ANAEROBIC 8CC   Final   Culture  Setup Time 08/09/2012 20:28   Final   Culture     Final   Value: STREPTOCOCCUS PNEUMONIAE     Note: Gram Stain Report Called to,Read Back By and Verified With: BECK A 08/10/12 0945 BY THOMPSONS Performed at [facility]   Report Status 08/12/2012 FINAL   Final   Organism ID, Bacteria STREPTOCOCCUS PNEUMONIAE   Final  URINE CULTURE     Status: None   Collection Time    08/09/12  9:23 PM      Result Value Range Status   Specimen Description URINE, CATHETERIZED   Final   Special Requests NONE   Final   Culture  Setup Time 08/10/2012 23:38   Final   Colony Count NO GROWTH   Final   Culture NO GROWTH   Final   Report Status 08/11/2012 FINAL   Final  MRSA PCR SCREENING     Status: Abnormal   Collection Time    08/10/12 12:53 AM      Result Value Range Status   MRSA by PCR POSITIVE (*) NEGATIVE Final   Comment:  The GeneXpert MRSA Assay (FDA     approved for NASAL specimens     only), is one component of a     comprehensive MRSA colonization     surveillance program. It is not     intended to diagnose MRSA     infection nor to guide or     monitor treatment for     MRSA infections.     RESULT CALLED TO, READ BACK BY AND VERIFIED WITH:     CLAUDIO,J RN [id number] 08/10/12 MITCHELL,L  CULTURE, RESPIRATORY (NON-EXPECTORATED)     Status: None   Collection Time    08/10/12  1:10 AM      Result Value Range Status   Specimen Description ENDOTRACHEAL ASPIRATE   Final   Special Requests NONE   Final   Gram Stain     Final   Value: RARE WBC PRESENT, PREDOMINANTLY PMN     RARE  SQUAMOUS EPITHELIAL CELLS PRESENT     RARE GRAM POSITIVE COCCI     IN PAIRS   Culture     Final   Value: MODERATE STREPTOCOCCUS PNEUMONIAE     FEW YEAST CONSISTENT WITH CANDIDA SPECIES   Report Status 08/13/2012 FINAL   Final   Organism ID, Bacteria STREPTOCOCCUS PNEUMONIAE   Final  URINE CULTURE     Status: None   Collection Time    08/10/12  7:50 AM      Result Value Range Status   Specimen Description URINE, CATHETERIZED   Final   Special Requests NONE   Final   Culture  Setup Time 08/10/2012 20:49   Final   Colony Count NO GROWTH   Final   Culture NO GROWTH   Final   Report Status 08/11/2012 FINAL   Final  CULTURE, RESPIRATORY (NON-EXPECTORATED)     Status: None   Collection Time    08/10/12  4:30 PM      Result Value Range Status   Specimen Description TRACHEAL ASPIRATE   Final   Special Requests Normal   Final   Gram Stain     Final   Value: RARE WBC PRESENT,BOTH PMN AND MONONUCLEAR     RARE SQUAMOUS EPITHELIAL CELLS PRESENT     RARE GRAM POSITIVE COCCI     IN PAIRS   Culture Non-Pathogenic Oropharyngeal-type Flora Isolated.   Final   Report Status 08/12/2012 FINAL   Final    RADIOLOGY STUDIES/RESULTS: Ct Head Wo Contrast  08/09/2012   *RADIOLOGY REPORT*  Clinical Data: Unresponsive.  On ventilator.  Hypertension. Multiple prior infarcts.  Prostate cancer.  CT HEAD WITHOUT CONTRAST  Technique:  Contiguous axial images were obtained from the base of the skull through the vertex without contrast.  Comparison: None.  Findings: Artifact on the present exam slightly limits evaluation.  No intracranial hemorrhage.  Moderate small vessel disease type changes.  Right frontal lobe infarct of indeterminate age.  Global atrophy without hydrocephalus.  Vascular calcifications.  No intracranial mass lesion detected on this unenhanced exam.  Mastoid air cells, middle ear cavities and visualized sinuses are clear.  IMPRESSION: Artifact on the present exam slightly limits evaluation.  No  intracranial hemorrhage.  Moderate small vessel disease type changes.  Right frontal lobe infarct of indeterminate age.  Global atrophy without hydrocephalus.   Original Report Authenticated By: Lacy Duverney, M.D.   Ct Chest Wo Contrast  08/12/2012   *RADIOLOGY REPORT*  Clinical Data: Question loculated pneumonia seen on bedside ultrasound.  CT CHEST WITHOUT CONTRAST  Technique:  Multidetector CT imaging of the chest was performed following the standard protocol without IV contrast.  Comparison: Chest x-ray from the same day.  Findings:  THORACIC INLET/BODY WALL:  Endotracheal tube terminates in the upper thoracic trachea. Gastric suction tube enters the stomach, which is largely decompressed.  There is a right IJ central venous catheter, tip in the SVC.  MEDIASTINUM:  No definite cardiomegaly.  Trace pericardial effusion or thickening.  Proximal LAD coronary artery atherosclerosis.  Four vessel aortic arch.  No evidence of acute vascular abnormality.  LUNG WINDOWS:  There is dense consolidation in the dependent right lung, affecting the lower lobe, posterior segment right upper lobe, and dependent right middle lobe.  There is linear high density material within the right lower lobe predominately.  Small effusion is present, predominately layering with some fluid extending into the major fissure.  The effusion measures up to 2 cm in thickness along the posterior chest argin.  No definitive PNA cavitation.  Less extensive consolidation, also with high density material, at the left base.  Nonspecific coarsened interstitium in the lingula. Centrilobular emphysema.  No pneumothorax.  UPPER ABDOMEN:  No acute findings.  OSSEOUS:  Diffuse degenerative disc disease.  There are flowing osteophytes or syndesmophytes throughout the thoracic region, with extensive ankylosis.  IMPRESSION: 1. Multifocal pneumonia, right worse than left. The dependent pattern and high density material within the consolidation suggests  aspiration.  2. Small right parapneumonic effusion.  3.  Emphysema. 4.  Satisfactory positioning of support apparatus.   Original Report Authenticated By: Tiburcio Pea   US Renal Port  08/11/2012   *RADIOLOGY REPORT*  Clinical Data: 79 year old male with acute renal failure.  RENAL/URINARY TRACT ULTRASOUND COMPLETE  Comparison:  None.  Findings:  Right Kidney:  Central hypo echogenicity favored to reflect peripelvic cysts.  No definite hydronephrosis.  Superimposed exophytic upper pole cyst measuring 2.8 cm.  Right with renal length 11.1 cm.  The cortex cortex appears decreased in thickness and increased in echogenicity.  Left Kidney:  No hydronephrosis.  Renal length 10.4 cm.  Cortical thinning and increased echogenicity similar to the right side.  Bladder:  Not visualized, reportedly a Foley catheter is in place.  IMPRESSION: No definite hydronephrosis or acute renal finding.  Suspect parapelvic renal cysts on the right.  Evidence of chronic medical renal disease.   Original Report Authenticated By: Erskine Speed, M.D.   Dg Chest Port 1 View  08/15/2012   *RADIOLOGY REPORT*  Clinical Data: Post extubation, evaluate lung fields  PORTABLE CHEST - 1 VIEW  Comparison: 08/14/2012; 08/13/2012; 08/11/2012; chest CT - 08/11/2012  Findings:  Grossly unchanged enlarged cardiac silhouette and mediastinal contours with tortuosity of the thoracic aorta.  Interval removal of support apparatus.  No definite pneumothorax.  Bibasilar coarse heterogeneous air space opacities are grossly unchanged.  Grossly unchanged linear heterogeneous opacity in the right mid lung.  No new focal airspace opacities.  No definite pleural effusion. Unchanged bones.  IMPRESSION: 1.  Interval removal of support apparatus.  No pneumothorax. 2.  Grossly unchanged extensive bibasilar and right mid lung coarse heterogeneous air space opacities worrisome for multifocal infection.  A follow-up chest radiograph in 4 to 6 weeks after treatment is  recommended to ensure resolution.   Original Report Authenticated By: Tacey Ruiz, MD   Dg Chest Port 1 View  08/14/2012   *RADIOLOGY REPORT*  Clinical Data: Evaluate lung fields and position of endotracheal tube  PORTABLE CHEST - 1 VIEW  Comparison: Portable  chest of 08/13/2012  Findings: There has been further improvement in airspace disease with persistent small effusions and right mid lung atelectasis. The endotracheal tube is unchanged in position as is the right central venous line.  IMPRESSION: Slightly improved aeration with some improvement in airspace disease.   Original Report Authenticated By: Dwyane Dee, M.D.   Dg Chest Port 1 View  08/13/2012   *RADIOLOGY REPORT*  Clinical Data: Ventilatory support, respiratory failure  PORTABLE CHEST - 1 VIEW  Comparison: 08/12/2012  Findings: Endotracheal tube approximately 3.6 cm above the carina. Right IJ central line and NG tube are stable and position.  Slight interval improvement in right midlung and bibasilar airspace process versus edema.  Small right effusion suspected.  No pneumothorax.  Atherosclerosis of the aorta.  IMPRESSION: Stable support apparatus.  Improving asymmetric airspace process versus edema.   Original Report Authenticated By: Judie Petit. Miles Costain, M.D.   Dg Chest Port 1 View  08/12/2012   *RADIOLOGY REPORT*  Clinical Data: Evaluate lungs and endotracheal tube position  PORTABLE CHEST - 1 VIEW  Comparison: Portable exam 0610 hours compared 08/11/2012  Findings: Tip of endotracheal tube projects 5.6 cm above carina. Enlargement of cardiac silhouette. Calcified tortuous aorta. Pulmonary vascularity normal. Bilateral airspace infiltrates at mid-to-lower lungs greater on right, little changed. Minimal associated left basilar atelectasis. Small right pleural effusion. No pneumothorax.  IMPRESSION: Persistent bibasilar infiltrates right greater than left with minimal left basilar atelectasis and small right pleural effusion. Satisfactory endotracheal  tube position.   Original Report Authenticated By: Ulyses Southward, M.D.   Dg Chest Port 1 View  08/11/2012   *RADIOLOGY REPORT*  Clinical Data: Central line placement.  PORTABLE CHEST - 1 VIEW  Comparison: Earlier film of the same day  Findings: Right IJ central line has its tip in the mid SVC.  No pneumothorax.  Endotracheal tube and nasogastric tube remain in place.  Coarse bibasilar airspace disease, right worse than left as before.  Possible layering right effusion.  Heart size upper limits normal for technique.  IMPRESSION:  1.  Central line to the mid SVC without pneumothorax. 2.  Little change in asymmetric airspace disease.   Original Report Authenticated By: D. Andria Rhein, MD   Dg Chest Port 1 View  08/11/2012   *RADIOLOGY REPORT*  Clinical Data: Intubated, shortness of breath  PORTABLE CHEST - 1 VIEW  Comparison: 08/10/2012; 08/09/2012; 03/01/2008  Findings:  Grossly unchanged enlarged cardiac silhouette and mediastinal contours with atherosclerotic calcifications within a tortuous thoracic aorta. Stable positioning of support apparatus.  Interval worsening of right mid and now right lower lung heterogeneous and consolidative airspace opacities, now is obscuration of the right heart border.  Suspected development of a small right-sided pleural effusion.  Left lower lung heterogeneous air space opacities are grossly unchanged.  No pneumothorax.  Unchanged bones.  IMPRESSION: 1.  Stable positioning of support apparatus.  No pneumothorax. 2.  Worsening right mid and lower lung heterogeneous air space opacities worrisome for progression of multifocal infection, now with development of a small right-sided parapneumonic effusion. 3.  Grossly unchanged left lower lung heterogeneous opacities, atelectasis versus infiltrate.   Original Report Authenticated By: Tacey Ruiz, MD   Dg Chest Port 1 View  08/10/2012   *RADIOLOGY REPORT*  Clinical Data: Intubated.  PORTABLE CHEST - 1 VIEW  Comparison: Yesterday.   Findings: Better visualized endotracheal tube with its tip approximately 4 cm above the carina.  Nasogastric tube tip in the mid stomach.  Increased right upper lobe airspace  opacity. Improved aeration of the lung bases.  The cardiac silhouette remains borderline enlarged.  Mildly improved interstitial prominence.  Thoracic spine degenerative changes.  IMPRESSION:  1.  Endotracheal tube in satisfactory position. 2.  Mildly increased right upper lobe pneumonia. 3.  Improved bibasilar atelectasis.   Original Report Authenticated By: Beckie Salts, M.D.   Dg Chest Portable 1 View  08/09/2012   *RADIOLOGY REPORT*  Clinical Data: Intubated.  PORTABLE CHEST - 1 VIEW  Comparison: 03/01/2008.  Findings: Poorly visualized endotracheal tube with its tip most likely at the level of the clavicles.  Patchy opacity in the right upper lobe.  Minimal patchy opacity at both lung bases.  Increased prominence of the interstitial markings.  The cardiac silhouette remains borderline enlarged.  Thoracic spine degenerative changes.  IMPRESSION:  1.  Poorly visualized endotracheal tube, most likely in satisfactory position. 2.  Right upper lobe probable pneumonia. 3.  Mild bibasilar atelectasis or pneumonia. 4.  Increased interstitial lung disease with stable borderline cardiomegaly.   Original Report Authenticated By: Beckie Salts, M.D.    Jeoffrey Massed, MD  [facility] Pager:[phone]  If 7PM-7AM, please contact night-coverage [email or web address] Password [email or web address] 08/16/2012, 2:10 PM   LOS: 7 days

## 2012-08-17 LAB — BASIC METABOLIC PANEL
BUN: 60 mg/dL — ABNORMAL HIGH (ref 6–23)
Calcium: 9.2 mg/dL (ref 8.4–10.5)
Creatinine, Ser: 4.48 mg/dL — ABNORMAL HIGH (ref 0.50–1.35)
GFR calc Af Amer: 13 mL/min — ABNORMAL LOW (ref >90)
GFR calc non Af Amer: 11 mL/min — ABNORMAL LOW (ref >90)
Potassium: 4.1 mEq/L (ref 3.5–5.1)

## 2012-08-17 NOTE — Progress Notes (Signed)
PATIENT DETAILS Name: Brad Jones Age: 79 y.o. Sex: male Date of Birth: March 04, 1927 Admit Date: 08/09/2012 Admitting Physician Lupita Leash, MD [id number], Provider, MD  Subjective: No major complaints  Assessment/Plan: Principal Problem:   Acute respiratory failure with hypoxia -2/2 Comm Acq PNA -required intubation/mech ventilation on admission, Extubated on 7/17 -currently doing well with just 2 L of O2 via [place]  Active Problems: Septic Shock -2/2 PNA/Strep Pneumoniae Bacteremia -resolved-initially required Central Line placement and pressors -BP stable-infact has been resumed on Metoprolol  Severe Comm Acq PNA  -Both Sputum and Blood cultures positive for Strep Pneumoniae Bacteremia -on admission required broad spectrum antibiotics-currently narrowed down to Amoxicillin till 7/20 Culture data as follows 7/12 blood >>> GPC chains >>>Strep pneumonia>>>pan sensitive  7/13 resp >>> strep pneumo pan sensitive, some yeast  7/13 urine strep >>> Positive  7/13 Resp Virus Multiplex PCR >>>  7/13 urine legionella >>>negative  7/13 urine >>> no growth  Acute Encephalopathy -Resolved -2/2 PNA/Septic Shock/Resp Failure -CT Head on admission neg for acute abnormalities  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 IVF has now been discontinued -per PCCM note-patient does not want HD under any circumstance  Hypernatremia -encourage PO intake -resolved  Thrombocytopenia/Anemia -2/2 critical illness -resolving  COPD -lungs clear -stable currently -c/w Spiriva -prn Albuterol  HTN -BP now stable with Metoprolol  Disposition: Remain inpatient-CIR when bed available.  DVT Prophylaxis: Prophylactic Heparin   Code Status: Full code   Family  Communication None at bedside  Procedures:  None  CONSULTS:  None   MEDICATIONS: Scheduled Meds: . antiseptic oral rinse  15 mL Mouth Rinse BID  . aspirin  81 mg Per Tube Daily  . heparin  5,000 Units Subcutaneous Q8H  . metoprolol tartrate  50 mg Oral BID  . multivitamin with minerals  1 tablet Per Tube Daily  . tiotropium  18 mcg Inhalation Daily   Continuous Infusions:  PRN Meds:.albuterol  Antibiotics: Anti-infectives   Start     Dose/Rate Route Frequency Ordered Stop   08/15/12 1045  amoxicillin (AMOXIL) capsule 500 mg     500 mg Oral Every 12 hours 08/15/12 1013 08/16/12 2119   08/10/12 2200  azithromycin (ZITHROMAX) 500 mg in dextrose 5 % 250 mL IVPB  Status:  Discontinued     500 mg 250 mL/hr over 60 Minutes Intravenous Every 24 hours 08/10/12 0109 08/11/12 0956   08/10/12 1800  piperacillin-tazobactam (ZOSYN) IVPB 2.25 g  Status:  Discontinued     2.25 g 100 mL/hr over 30 Minutes Intravenous 4 times per day 08/10/12 1213 08/15/12 1013   08/10/12 1215  vancomycin (VANCOCIN) 1,500 mg in sodium chloride 0.9 % 500 mL IVPB  Status:  Discontinued     1,500 mg 250 mL/hr over 120 Minutes Intravenous Every 48 hours 08/10/12 1213 08/13/12 0805   08/10/12 1000  cefTRIAXone (ROCEPHIN) injection 1 g  Status:  Discontinued     1 g Intramuscular Every 12 hours 08/10/12 0109 08/10/12 0115   08/10/12 1000  cefTRIAXone (ROCEPHIN) 1 g in dextrose 5 % 50 mL IVPB  Status:  Discontinued     1 g 100 mL/hr over 30 Minutes Intravenous Every 12 hours 08/10/12 0115 08/10/12 1149   08/09/12 2045  cefTRIAXone (ROCEPHIN) 1 g in dextrose 5 % 50 mL IVPB  1 g 100 mL/hr over 30 Minutes Intravenous  Once 08/09/12 2034 08/09/12 2125   08/09/12 2045  azithromycin (ZITHROMAX) 500 mg in dextrose 5 % 250 mL IVPB     500 mg 250 mL/hr over 60 Minutes Intravenous  Once 08/09/12 2034 08/09/12 2231       PHYSICAL EXAM: Vital signs in last 24 hours: Filed Vitals:   08/16/12 1346 08/16/12 2111  08/17/12 0525 08/17/12 0738  BP: 120/65 114/69 108/63   Pulse: 78 75 70   Temp: 98.5 F (36.9 C) 99.1 F (37.3 C) 98.2 F (36.8 C)   TempSrc: Oral Oral Oral   Resp: 18 18 18    Height:      Weight:      SpO2:  92% 97% 96%    Weight change:  Filed Weights   08/13/12 0400 08/14/12 0400 08/15/12 0500  Weight: 95.6 kg (210 lb 12.2 oz) 89.4 kg (197 lb 1.5 oz) 82.6 kg (182 lb 1.6 oz)   Body mass index is 27.69 kg/(m^2).   Gen Exam: Awake and alert with clear speech.   Neck: Supple, No JVD.   Chest: B/L Clear.   CVS: S1 S2 Regular, no murmurs.  Abdomen: soft, BS +, non tender, non distended.  Extremities: no edema, lower extremities warm to touch. Neurologic: Non Focal.  Skin: No Rash. Psoriasis plaque in ant b/l knees  Wounds: N/A.    Intake/Output from previous day:  Intake/Output Summary (Last 24 hours) at 08/17/12 1020 Last data filed at 08/17/12 0200  Gross per 24 hour  Intake    240 ml  Output   1053 ml  Net   -813 ml     LAB RESULTS: CBC  Recent Labs Lab 08/12/12 0400 08/13/12 0423 08/14/12 0415 08/15/12 0425 08/16/12 0530  WBC 11.7* 11.3* 10.2 11.0* 9.5  HGB 10.1* 10.9* 11.5* 12.1* 12.1*  HCT 31.3* 33.3* 35.3* 40.2 39.6  PLT 84* 80* 94* 106* 127*  MCV 77.9* 78.4 78.3 80.9 81.5  MCH 25.1* 25.6* 25.5* 24.3* 24.9*  MCHC 32.3 32.7 32.6 30.1 30.6  RDW 15.3 15.4 15.3 15.4 15.3    Chemistries   Recent Labs Lab 08/11/12 0400  08/13/12 0423 08/14/12 0415 08/15/12 0425 08/16/12 0530 08/17/12 0605  NA 142  < > 147* 149* 151* 150* 143  K 4.9  < > 4.2 3.6 3.8 3.4* 4.1  CL 109  < > 114* 111 106 103 103  CO2 25  < > 24 28 37* 38* 29  GLUCOSE 81  < > 118* 114* 101* 99 88  BUN 61*  < > 69* 75* 71* 70* 60*  CREATININE 4.47*  < > 4.40* 4.50* 4.81* 5.10* 4.48*  CALCIUM 8.6  < > 8.8 9.4 9.5 9.2 9.2  MG 1.6  --   --   --   --   --   --   < > = values in this interval not displayed.  CBG:  Recent Labs Lab 08/15/12 1648 08/16/12 0735 08/16/12 1158  08/16/12 1706 08/17/12 0746  GLUCAP 123* 89 85 123* 69*    GFR Estimated Creatinine Clearance: 13.3 ml/min (by C-G formula based on Cr of 4.48).  Coagulation profile No results found for this basename: INR, PROTIME,  in the last 168 hours  Cardiac Enzymes  Recent Labs Lab 08/10/12 1716 08/10/12 2212 08/11/12 0856  TROPONINI 0.82* 0.76* 0.59*    No components found with this basename: POCBNP,  No results found for this basename: DDIMER,  in the  last 72 hours No results found for this basename: HGBA1C,  in the last 72 hours No results found for this basename: CHOL, HDL, LDLCALC, TRIG, CHOLHDL, LDLDIRECT,  in the last 72 hours No results found for this basename: TSH, T4TOTAL, FREET3, T3FREE, THYROIDAB,  in the last 72 hours No results found for this basename: VITAMINB12, FOLATE, FERRITIN, TIBC, IRON, RETICCTPCT,  in the last 72 hours No results found for this basename: LIPASE, AMYLASE,  in the last 72 hours  Urine Studies No results found for this basename: UACOL, UAPR, USPG, UPH, UTP, UGL, UKET, UBIL, UHGB, UNIT, UROB, ULEU, UEPI, UWBC, URBC, UBAC, CAST, CRYS, UCOM, BILUA,  in the last 72 hours  MICROBIOLOGY: Recent Results (from the past 240 hour(s))  CULTURE, BLOOD (ROUTINE X 2)     Status: None   Collection Time    08/09/12  8:19 PM      Result Value Range Status   Specimen Description LEFT ANTECUBITAL   Final   Special Requests BOTTLES DRAWN AEROBIC AND ANAEROBIC 8CC   Final   Culture  Setup Time 08/09/2012 20:19   Final   Culture     Final   Value: STREPTOCOCCUS PNEUMONIAE     Note: SUSCEPTIBILITIES PERFORMED ON PREVIOUS CULTURE WITHIN THE LAST 5 DAYS.     Note: Gram Stain Report Called to,Read Back By and Verified With: BECK A 08/10/12 0945 BY THOMPSONS Performed at [facility]   Report Status 08/12/2012 FINAL   Final  CULTURE, BLOOD (ROUTINE X 2)     Status: None   Collection Time    08/09/12  8:28 PM      Result Value Range Status   Specimen Description  BLOOD RIGHT WRIST   Final   Special Requests BOTTLES DRAWN AEROBIC AND ANAEROBIC 8CC   Final   Culture  Setup Time 08/09/2012 20:28   Final   Culture     Final   Value: STREPTOCOCCUS PNEUMONIAE     Note: Gram Stain Report Called to,Read Back By and Verified With: BECK A 08/10/12 0945 BY THOMPSONS Performed at [facility]   Report Status 08/12/2012 FINAL   Final   Organism ID, Bacteria STREPTOCOCCUS PNEUMONIAE   Final  URINE CULTURE     Status: None   Collection Time    08/09/12  9:23 PM      Result Value Range Status   Specimen Description URINE, CATHETERIZED   Final   Special Requests NONE   Final   Culture  Setup Time 08/10/2012 23:38   Final   Colony Count NO GROWTH   Final   Culture NO GROWTH   Final   Report Status 08/11/2012 FINAL   Final  MRSA PCR SCREENING     Status: Abnormal   Collection Time    08/10/12 12:53 AM      Result Value Range Status   MRSA by PCR POSITIVE (*) NEGATIVE Final   Comment:            The GeneXpert MRSA Assay (FDA     approved for NASAL specimens     only), is one component of a     comprehensive MRSA colonization     surveillance program. It is not     intended to diagnose MRSA     infection nor to guide or     monitor treatment for     MRSA infections.     RESULT CALLED TO, READ BACK BY AND VERIFIED WITH:  CLAUDIO,J RN [id number] 08/10/12 MITCHELL,L  CULTURE, RESPIRATORY (NON-EXPECTORATED)     Status: None   Collection Time    08/10/12  1:10 AM      Result Value Range Status   Specimen Description ENDOTRACHEAL ASPIRATE   Final   Special Requests NONE   Final   Gram Stain     Final   Value: RARE WBC PRESENT, PREDOMINANTLY PMN     RARE SQUAMOUS EPITHELIAL CELLS PRESENT     RARE GRAM POSITIVE COCCI     IN PAIRS   Culture     Final   Value: MODERATE STREPTOCOCCUS PNEUMONIAE     FEW YEAST CONSISTENT WITH CANDIDA SPECIES   Report Status 08/13/2012 FINAL   Final   Organism ID, Bacteria STREPTOCOCCUS PNEUMONIAE   Final  URINE CULTURE      Status: None   Collection Time    08/10/12  7:50 AM      Result Value Range Status   Specimen Description URINE, CATHETERIZED   Final   Special Requests NONE   Final   Culture  Setup Time 08/10/2012 20:49   Final   Colony Count NO GROWTH   Final   Culture NO GROWTH   Final   Report Status 08/11/2012 FINAL   Final  CULTURE, RESPIRATORY (NON-EXPECTORATED)     Status: None   Collection Time    08/10/12  4:30 PM      Result Value Range Status   Specimen Description TRACHEAL ASPIRATE   Final   Special Requests Normal   Final   Gram Stain     Final   Value: RARE WBC PRESENT,BOTH PMN AND MONONUCLEAR     RARE SQUAMOUS EPITHELIAL CELLS PRESENT     RARE GRAM POSITIVE COCCI     IN PAIRS   Culture Non-Pathogenic Oropharyngeal-type Flora Isolated.   Final   Report Status 08/12/2012 FINAL   Final    RADIOLOGY STUDIES/RESULTS: Ct Head Wo Contrast  08/09/2012   *RADIOLOGY REPORT*  Clinical Data: Unresponsive.  On ventilator.  Hypertension. Multiple prior infarcts.  Prostate cancer.  CT HEAD WITHOUT CONTRAST  Technique:  Contiguous axial images were obtained from the base of the skull through the vertex without contrast.  Comparison: None.  Findings: Artifact on the present exam slightly limits evaluation.  No intracranial hemorrhage.  Moderate small vessel disease type changes.  Right frontal lobe infarct of indeterminate age.  Global atrophy without hydrocephalus.  Vascular calcifications.  No intracranial mass lesion detected on this unenhanced exam.  Mastoid air cells, middle ear cavities and visualized sinuses are clear.  IMPRESSION: Artifact on the present exam slightly limits evaluation.  No intracranial hemorrhage.  Moderate small vessel disease type changes.  Right frontal lobe infarct of indeterminate age.  Global atrophy without hydrocephalus.   Original Report Authenticated By: Lacy Duverney, M.D.   Ct Chest Wo Contrast  08/12/2012   *RADIOLOGY REPORT*  Clinical Data: Question loculated  pneumonia seen on bedside ultrasound.  CT CHEST WITHOUT CONTRAST  Technique:  Multidetector CT imaging of the chest was performed following the standard protocol without IV contrast.  Comparison: Chest x-ray from the same day.  Findings:  THORACIC INLET/BODY WALL:  Endotracheal tube terminates in the upper thoracic trachea. Gastric suction tube enters the stomach, which is largely decompressed.  There is a right IJ central venous catheter, tip in the SVC.  MEDIASTINUM:  No definite cardiomegaly.  Trace pericardial effusion or thickening.  Proximal LAD coronary artery atherosclerosis.  Four vessel aortic arch.  No evidence of acute vascular abnormality.  LUNG WINDOWS:  There is dense consolidation in the dependent right lung, affecting the lower lobe, posterior segment right upper lobe, and dependent right middle lobe.  There is linear high density material within the right lower lobe predominately.  Small effusion is present, predominately layering with some fluid extending into the major fissure.  The effusion measures up to 2 cm in thickness along the posterior chest argin.  No definitive PNA cavitation.  Less extensive consolidation, also with high density material, at the left base.  Nonspecific coarsened interstitium in the lingula. Centrilobular emphysema.  No pneumothorax.  UPPER ABDOMEN:  No acute findings.  OSSEOUS:  Diffuse degenerative disc disease.  There are flowing osteophytes or syndesmophytes throughout the thoracic region, with extensive ankylosis.  IMPRESSION: 1. Multifocal pneumonia, right worse than left. The dependent pattern and high density material within the consolidation suggests aspiration.  2. Small right parapneumonic effusion.  3.  Emphysema. 4.  Satisfactory positioning of support apparatus.   Original Report Authenticated By: Tiburcio Pea   US Renal Port  08/11/2012   *RADIOLOGY REPORT*  Clinical Data: 79 year old male with acute renal failure.  RENAL/URINARY TRACT ULTRASOUND  COMPLETE  Comparison:  None.  Findings:  Right Kidney:  Central hypo echogenicity favored to reflect peripelvic cysts.  No definite hydronephrosis.  Superimposed exophytic upper pole cyst measuring 2.8 cm.  Right with renal length 11.1 cm.  The cortex cortex appears decreased in thickness and increased in echogenicity.  Left Kidney:  No hydronephrosis.  Renal length 10.4 cm.  Cortical thinning and increased echogenicity similar to the right side.  Bladder:  Not visualized, reportedly a Foley catheter is in place.  IMPRESSION: No definite hydronephrosis or acute renal finding.  Suspect parapelvic renal cysts on the right.  Evidence of chronic medical renal disease.   Original Report Authenticated By: Erskine Speed, M.D.   Dg Chest Port 1 View  08/15/2012   *RADIOLOGY REPORT*  Clinical Data: Post extubation, evaluate lung fields  PORTABLE CHEST - 1 VIEW  Comparison: 08/14/2012; 08/13/2012; 08/11/2012; chest CT - 08/11/2012  Findings:  Grossly unchanged enlarged cardiac silhouette and mediastinal contours with tortuosity of the thoracic aorta.  Interval removal of support apparatus.  No definite pneumothorax.  Bibasilar coarse heterogeneous air space opacities are grossly unchanged.  Grossly unchanged linear heterogeneous opacity in the right mid lung.  No new focal airspace opacities.  No definite pleural effusion. Unchanged bones.  IMPRESSION: 1.  Interval removal of support apparatus.  No pneumothorax. 2.  Grossly unchanged extensive bibasilar and right mid lung coarse heterogeneous air space opacities worrisome for multifocal infection.  A follow-up chest radiograph in 4 to 6 weeks after treatment is recommended to ensure resolution.   Original Report Authenticated By: Tacey Ruiz, MD   Dg Chest Port 1 View  08/14/2012   *RADIOLOGY REPORT*  Clinical Data: Evaluate lung fields and position of endotracheal tube  PORTABLE CHEST - 1 VIEW  Comparison: Portable chest of 08/13/2012  Findings: There has been further  improvement in airspace disease with persistent small effusions and right mid lung atelectasis. The endotracheal tube is unchanged in position as is the right central venous line.  IMPRESSION: Slightly improved aeration with some improvement in airspace disease.   Original Report Authenticated By: Dwyane Dee, M.D.   Dg Chest Port 1 View  08/13/2012   *RADIOLOGY REPORT*  Clinical Data: Ventilatory support, respiratory failure  PORTABLE CHEST - 1 VIEW  Comparison: 08/12/2012  Findings:  Endotracheal tube approximately 3.6 cm above the carina. Right IJ central line and NG tube are stable and position.  Slight interval improvement in right midlung and bibasilar airspace process versus edema.  Small right effusion suspected.  No pneumothorax.  Atherosclerosis of the aorta.  IMPRESSION: Stable support apparatus.  Improving asymmetric airspace process versus edema.   Original Report Authenticated By: Judie Petit. Miles Costain, M.D.   Dg Chest Port 1 View  08/12/2012   *RADIOLOGY REPORT*  Clinical Data: Evaluate lungs and endotracheal tube position  PORTABLE CHEST - 1 VIEW  Comparison: Portable exam 0610 hours compared 08/11/2012  Findings: Tip of endotracheal tube projects 5.6 cm above carina. Enlargement of cardiac silhouette. Calcified tortuous aorta. Pulmonary vascularity normal. Bilateral airspace infiltrates at mid-to-lower lungs greater on right, little changed. Minimal associated left basilar atelectasis. Small right pleural effusion. No pneumothorax.  IMPRESSION: Persistent bibasilar infiltrates right greater than left with minimal left basilar atelectasis and small right pleural effusion. Satisfactory endotracheal tube position.   Original Report Authenticated By: Ulyses Southward, M.D.   Dg Chest Port 1 View  08/11/2012   *RADIOLOGY REPORT*  Clinical Data: Central line placement.  PORTABLE CHEST - 1 VIEW  Comparison: Earlier film of the same day  Findings: Right IJ central line has its tip in the mid SVC.  No pneumothorax.   Endotracheal tube and nasogastric tube remain in place.  Coarse bibasilar airspace disease, right worse than left as before.  Possible layering right effusion.  Heart size upper limits normal for technique.  IMPRESSION:  1.  Central line to the mid SVC without pneumothorax. 2.  Little change in asymmetric airspace disease.   Original Report Authenticated By: D. Andria Rhein, MD   Dg Chest Port 1 View  08/11/2012   *RADIOLOGY REPORT*  Clinical Data: Intubated, shortness of breath  PORTABLE CHEST - 1 VIEW  Comparison: 08/10/2012; 08/09/2012; 03/01/2008  Findings:  Grossly unchanged enlarged cardiac silhouette and mediastinal contours with atherosclerotic calcifications within a tortuous thoracic aorta. Stable positioning of support apparatus.  Interval worsening of right mid and now right lower lung heterogeneous and consolidative airspace opacities, now is obscuration of the right heart border.  Suspected development of a small right-sided pleural effusion.  Left lower lung heterogeneous air space opacities are grossly unchanged.  No pneumothorax.  Unchanged bones.  IMPRESSION: 1.  Stable positioning of support apparatus.  No pneumothorax. 2.  Worsening right mid and lower lung heterogeneous air space opacities worrisome for progression of multifocal infection, now with development of a small right-sided parapneumonic effusion. 3.  Grossly unchanged left lower lung heterogeneous opacities, atelectasis versus infiltrate.   Original Report Authenticated By: Tacey Ruiz, MD   Dg Chest Port 1 View  08/10/2012   *RADIOLOGY REPORT*  Clinical Data: Intubated.  PORTABLE CHEST - 1 VIEW  Comparison: Yesterday.  Findings: Better visualized endotracheal tube with its tip approximately 4 cm above the carina.  Nasogastric tube tip in the mid stomach.  Increased right upper lobe airspace opacity. Improved aeration of the lung bases.  The cardiac silhouette remains borderline enlarged.  Mildly improved interstitial prominence.   Thoracic spine degenerative changes.  IMPRESSION:  1.  Endotracheal tube in satisfactory position. 2.  Mildly increased right upper lobe pneumonia. 3.  Improved bibasilar atelectasis.   Original Report Authenticated By: Beckie Salts, M.D.   Dg Chest Portable 1 View  08/09/2012   *RADIOLOGY REPORT*  Clinical Data: Intubated.  PORTABLE CHEST - 1 VIEW  Comparison: 03/01/2008.  Findings: Poorly visualized endotracheal  tube with its tip most likely at the level of the clavicles.  Patchy opacity in the right upper lobe.  Minimal patchy opacity at both lung bases.  Increased prominence of the interstitial markings.  The cardiac silhouette remains borderline enlarged.  Thoracic spine degenerative changes.  IMPRESSION:  1.  Poorly visualized endotracheal tube, most likely in satisfactory position. 2.  Right upper lobe probable pneumonia. 3.  Mild bibasilar atelectasis or pneumonia. 4.  Increased interstitial lung disease with stable borderline cardiomegaly.   Original Report Authenticated By: Beckie Salts, M.D.    Jeoffrey Massed, MD  [facility] Pager:[phone]  If 7PM-7AM, please contact night-coverage [email or web address] Password [email or web address] 08/17/2012, 10:20 AM   LOS: 8 days

## 2012-08-18 ENCOUNTER — Inpatient Hospital Stay (HOSPITAL_COMMUNITY)
Admission: RE | Admit: 2012-08-18 | Discharge: 2012-08-28 | DRG: 945 | Disposition: A | Discharge status: Home Health | Payer: Medicare Other | Source: Intra-hospital | Attending: Physical Medicine & Rehabilitation | Admitting: Physical Medicine & Rehabilitation

## 2012-08-18 ENCOUNTER — Encounter (HOSPITAL_COMMUNITY): Payer: Self-pay | Admitting: *Deleted

## 2012-08-18 ENCOUNTER — Encounter (HOSPITAL_COMMUNITY): Payer: Self-pay | Admitting: Physical Medicine and Rehabilitation

## 2012-08-18 DIAGNOSIS — Z87891 Personal history of nicotine dependence: Secondary | ICD-10-CM

## 2012-08-18 DIAGNOSIS — L408 Other psoriasis: Secondary | ICD-10-CM | POA: Diagnosis present

## 2012-08-18 DIAGNOSIS — R197 Diarrhea, unspecified: Secondary | ICD-10-CM | POA: Diagnosis present

## 2012-08-18 DIAGNOSIS — E119 Type 2 diabetes mellitus without complications: Secondary | ICD-10-CM | POA: Diagnosis present

## 2012-08-18 DIAGNOSIS — R5381 Other malaise: Secondary | ICD-10-CM | POA: Diagnosis present

## 2012-08-18 DIAGNOSIS — Z8546 Personal history of malignant neoplasm of prostate: Secondary | ICD-10-CM

## 2012-08-18 DIAGNOSIS — A419 Sepsis, unspecified organism: Secondary | ICD-10-CM | POA: Diagnosis present

## 2012-08-18 DIAGNOSIS — I129 Hypertensive chronic kidney disease with stage 1 through stage 4 chronic kidney disease, or unspecified chronic kidney disease: Secondary | ICD-10-CM | POA: Diagnosis present

## 2012-08-18 DIAGNOSIS — I509 Heart failure, unspecified: Secondary | ICD-10-CM | POA: Diagnosis present

## 2012-08-18 DIAGNOSIS — H3530 Unspecified macular degeneration: Secondary | ICD-10-CM | POA: Diagnosis present

## 2012-08-18 DIAGNOSIS — R6520 Severe sepsis without septic shock: Secondary | ICD-10-CM | POA: Diagnosis present

## 2012-08-18 DIAGNOSIS — A4189 Other specified sepsis: Secondary | ICD-10-CM

## 2012-08-18 DIAGNOSIS — N184 Chronic kidney disease, stage 4 (severe): Secondary | ICD-10-CM | POA: Diagnosis present

## 2012-08-18 DIAGNOSIS — Z91199 Patient's noncompliance with other medical treatment and regimen due to unspecified reason: Secondary | ICD-10-CM

## 2012-08-18 DIAGNOSIS — G9341 Metabolic encephalopathy: Secondary | ICD-10-CM | POA: Diagnosis present

## 2012-08-18 DIAGNOSIS — A409 Streptococcal sepsis, unspecified: Secondary | ICD-10-CM | POA: Diagnosis present

## 2012-08-18 DIAGNOSIS — J438 Other emphysema: Secondary | ICD-10-CM | POA: Diagnosis present

## 2012-08-18 DIAGNOSIS — D696 Thrombocytopenia, unspecified: Secondary | ICD-10-CM | POA: Diagnosis present

## 2012-08-18 DIAGNOSIS — Z5189 Encounter for other specified aftercare: Principal | ICD-10-CM

## 2012-08-18 DIAGNOSIS — I639 Cerebral infarction, unspecified: Secondary | ICD-10-CM | POA: Diagnosis present

## 2012-08-18 DIAGNOSIS — J449 Chronic obstructive pulmonary disease, unspecified: Secondary | ICD-10-CM | POA: Diagnosis present

## 2012-08-18 DIAGNOSIS — Z8673 Personal history of transient ischemic attack (TIA), and cerebral infarction without residual deficits: Secondary | ICD-10-CM

## 2012-08-18 DIAGNOSIS — G4733 Obstructive sleep apnea (adult) (pediatric): Secondary | ICD-10-CM | POA: Diagnosis present

## 2012-08-18 DIAGNOSIS — Z9119 Patient's noncompliance with other medical treatment and regimen: Secondary | ICD-10-CM

## 2012-08-18 LAB — GLUCOSE, CAPILLARY
Glucose-Capillary: 68 mg/dL — ABNORMAL LOW (ref 70–99)
Glucose-Capillary: 151 mg/dL — ABNORMAL HIGH (ref 70–99)

## 2012-08-18 LAB — BASIC METABOLIC PANEL
CO2: 31 mEq/L (ref 19–32)
Calcium: 9.1 mg/dL (ref 8.4–10.5)
Creatinine, Ser: 4.31 mg/dL — ABNORMAL HIGH (ref 0.50–1.35)
Glucose, Bld: 79 mg/dL (ref 70–99)

## 2012-08-18 MED ORDER — FLEET ENEMA 7-19 GM/118ML RE ENEM: 1.0000 | ENEMA | Freq: Once | RECTAL | Status: AC | PRN

## 2012-08-18 MED ORDER — HYDROCERIN EX CREA
TOPICAL_CREAM | Freq: Two times a day (BID) | CUTANEOUS | Status: DC
  Administered 2012-08-19 – 2012-08-28 (×19): via TOPICAL
  Filled 2012-08-18 (×2): qty 113

## 2012-08-18 MED ORDER — ACETAMINOPHEN 325 MG PO TABS
325.0000 mg | ORAL_TABLET | ORAL | Status: DC | PRN
  Administered 2012-08-20: 650 mg via ORAL
  Filled 2012-08-18: qty 2

## 2012-08-18 MED ORDER — BISACODYL 10 MG RE SUPP: 10.0000 mg | Freq: Every day | RECTAL | Status: DC | PRN

## 2012-08-18 MED ORDER — ALBUTEROL SULFATE HFA 108 (90 BASE) MCG/ACT IN AERS
2.0000 | INHALATION_SPRAY | Freq: Four times a day (QID) | RESPIRATORY_TRACT | Status: DC | PRN
  Filled 2012-08-18: qty 6.7

## 2012-08-18 MED ORDER — TIOTROPIUM BROMIDE MONOHYDRATE 18 MCG IN CAPS
18.0000 ug | ORAL_CAPSULE | Freq: Every day | RESPIRATORY_TRACT | Status: DC
  Administered 2012-08-19 – 2012-08-28 (×9): 18 ug via RESPIRATORY_TRACT
  Filled 2012-08-18 (×2): qty 5

## 2012-08-18 MED ORDER — ONDANSETRON HCL 4 MG PO TABS: 4.0000 mg | ORAL_TABLET | Freq: Four times a day (QID) | ORAL | Status: DC | PRN

## 2012-08-18 MED ORDER — ADULT MULTIVITAMIN W/MINERALS CH
1.0000 | ORAL_TABLET | Freq: Every day | ORAL | Status: DC
  Administered 2012-08-19 – 2012-08-28 (×10): 1
  Filled 2012-08-18 (×12): qty 1

## 2012-08-18 MED ORDER — DIPHENHYDRAMINE HCL 12.5 MG/5ML PO ELIX: 12.5000 mg | ORAL_SOLUTION | Freq: Four times a day (QID) | ORAL | Status: DC | PRN

## 2012-08-18 MED ORDER — TRAZODONE HCL 50 MG PO TABS
25.0000 mg | ORAL_TABLET | Freq: Every evening | ORAL | Status: DC | PRN
  Administered 2012-08-23: 50 mg via ORAL
  Filled 2012-08-18: qty 1

## 2012-08-18 MED ORDER — SACCHAROMYCES BOULARDII 250 MG PO CAPS
250.0000 mg | ORAL_CAPSULE | Freq: Two times a day (BID) | ORAL | Status: DC
  Administered 2012-08-18 – 2012-08-28 (×20): 250 mg via ORAL
  Filled 2012-08-18 (×23): qty 1

## 2012-08-18 MED ORDER — GUAIFENESIN-DM 100-10 MG/5ML PO SYRP: 5.0000 mL | ORAL_SOLUTION | Freq: Four times a day (QID) | ORAL | Status: DC | PRN

## 2012-08-18 MED ORDER — FLUOCINONIDE 0.05 % EX CREA
TOPICAL_CREAM | Freq: Two times a day (BID) | CUTANEOUS | Status: DC
  Administered 2012-08-18 – 2012-08-25 (×15): via TOPICAL
  Administered 2012-08-26: 1 via TOPICAL
  Administered 2012-08-27 – 2012-08-28 (×3): via TOPICAL
  Filled 2012-08-18 (×3): qty 30

## 2012-08-18 MED ORDER — INSULIN ASPART 100 UNIT/ML ~~LOC~~ SOLN: 0.0000 [IU] | Freq: Three times a day (TID) | SUBCUTANEOUS | Status: DC

## 2012-08-18 MED ORDER — ENOXAPARIN SODIUM 30 MG/0.3ML ~~LOC~~ SOLN
30.0000 mg | SUBCUTANEOUS | Status: DC
  Administered 2012-08-18 – 2012-08-27 (×10): 30 mg via SUBCUTANEOUS
  Filled 2012-08-18 (×11): qty 0.3

## 2012-08-18 MED ORDER — LIDOCAINE HCL 2 % EX GEL
CUTANEOUS | Status: DC | PRN
  Filled 2012-08-18: qty 5

## 2012-08-18 MED ORDER — CALCITRIOL 0.25 MCG PO CAPS
0.2500 ug | ORAL_CAPSULE | Freq: Every day | ORAL | Status: DC
  Administered 2012-08-18 – 2012-08-28 (×11): 0.25 ug via ORAL
  Filled 2012-08-18 (×13): qty 1

## 2012-08-18 MED ORDER — METOPROLOL TARTRATE 50 MG PO TABS
50.0000 mg | ORAL_TABLET | Freq: Two times a day (BID) | ORAL | Status: DC
  Administered 2012-08-18 – 2012-08-28 (×20): 50 mg via ORAL
  Filled 2012-08-18 (×22): qty 1

## 2012-08-18 MED ORDER — ONDANSETRON HCL 4 MG/2ML IJ SOLN: 4.0000 mg | Freq: Four times a day (QID) | INTRAMUSCULAR | Status: DC | PRN

## 2012-08-18 MED ORDER — SACCHAROMYCES BOULARDII 250 MG PO CAPS
250.0000 mg | ORAL_CAPSULE | Freq: Two times a day (BID) | ORAL | Status: DC
  Administered 2012-08-18: 250 mg via ORAL
  Filled 2012-08-18 (×2): qty 1

## 2012-08-18 MED ORDER — CHOLESTYRAMINE LIGHT 4 G PO PACK
4.0000 g | PACK | Freq: Once | ORAL | Status: AC
  Administered 2012-08-18: 4 g via ORAL
  Filled 2012-08-18: qty 1

## 2012-08-18 MED ORDER — BIOTENE DRY MOUTH MT LIQD
15.0000 mL | Freq: Two times a day (BID) | OROMUCOSAL | Status: DC
  Administered 2012-08-18 – 2012-08-28 (×19): 15 mL via OROMUCOSAL

## 2012-08-18 MED ORDER — ASPIRIN 81 MG PO CHEW
81.0000 mg | CHEWABLE_TABLET | Freq: Every day | ORAL | Status: DC
  Administered 2012-08-19 – 2012-08-28 (×10): 81 mg
  Filled 2012-08-18 (×10): qty 1

## 2012-08-18 MED ORDER — ALUMINUM HYDROXIDE GEL 320 MG/5ML PO SUSP
30.0000 mL | Freq: Four times a day (QID) | ORAL | Status: DC | PRN
  Filled 2012-08-18: qty 30

## 2012-08-18 NOTE — Progress Notes (Signed)
Received patient from [facility]W.  Patient alert to self, place, and situation; knows it is 2014 but cannot recall the date.  Patient oriented to room and unit.  Patient denies pain; vitals stable.  Will continue to monitor.

## 2012-08-18 NOTE — Discharge Summary (Signed)
PATIENT DETAILS Name: Brad Jones Age: 79 y.o. Sex: male Date of Birth: [birth year] MRN: [id number]. Admit Date: 08/09/2012 Admitting Physician: Lupita Leash, MD WUJ:[id number], Provider, MD  Recommendations for Outpatient Follow-up:  1. Please follow blood cultures drawn on 08/18/12 2. Please monitor renal function closely-creatinine slowly decreasing. 3. Repeat CXR in 6-8 weeks to document resolution of PNA-otherwise will need Pulmonary evaluation  PRIMARY DISCHARGE DIAGNOSIS:  Principal Problem:   Acute respiratory failure with hypoxia Active Problems:   Acute renal failure   Septic shock(785.52)   Community acquired pneumonia  Acute Encephalopathy      PAST MEDICAL HISTORY: Past Medical History  Diagnosis Date  . Hypertension   . CHF (congestive heart failure)   . COPD (chronic obstructive pulmonary disease)   . Emphysema   . Diabetes mellitus without complication     non insulin dependent  . Cancer     prostate  . Renal disorder kidneys only function at 20%    pt does not want dialysis  . Stroke     TIA, multiple    DISCHARGE MEDICATIONS:   Medication List    ASK your doctor about these medications       albuterol 108 (90 BASE) MCG/ACT inhaler  Commonly known as:  PROVENTIL HFA;VENTOLIN HFA  Inhale 2 puffs into the lungs every 6 (six) hours as needed for wheezing.     aspirin 81 MG tablet  Take 81 mg by mouth daily.     calcitRIOL 0.25 MCG capsule  Commonly known as:  ROCALTROL  Take 0.25 mcg by mouth daily.     docusate sodium 50 MG capsule  Commonly known as:  COLACE  Take by mouth at bedtime.     fluocinonide ointment 0.05 %  Commonly known as:  LIDEX  Apply topically 2 (two) times daily.     furosemide 40 MG tablet  Commonly known as:  LASIX  Take 40 mg by mouth daily.     HYDROcodone-acetaminophen 5-325 MG per tablet  Commonly known as:  NORCO/VICODIN  Take 1 tablet by mouth every 6 (six) hours as needed for pain.     metoprolol 200 MG  24 hr tablet  Commonly known as:  TOPROL-XL  Take 200 mg by mouth daily.     nitroGLYCERIN 0.3 MG SL tablet  Commonly known as:  NITROSTAT  Place 0.3 mg under the tongue every 5 (five) minutes as needed for chest pain.     terazosin 2 MG capsule  Commonly known as:  HYTRIN  Take 2 mg by mouth at bedtime.     tiotropium 18 MCG inhalation capsule  Commonly known as:  SPIRIVA  Place 18 mcg into inhaler and inhale daily.        ALLERGIES:  No Known Allergies  BRIEF HPI:  See H&P, Labs, Consult and Test reports for all details in brief, 79 y/o male with HTN and COPD was brought in by EMS unresponsive in the setting of severe CAP requiring intubation. Found to be in AKI and transferred to [facility] from [facility] on 7/12.  CONSULTATIONS:   pulmonary/intensive care  PERTINENT RADIOLOGIC STUDIES: Ct Head Wo Contrast  08/09/2012   *RADIOLOGY REPORT*  Clinical Data: Unresponsive.  On ventilator.  Hypertension. Multiple prior infarcts.  Prostate cancer.  CT HEAD WITHOUT CONTRAST  Technique:  Contiguous axial images were obtained from the base of the skull through the vertex without contrast.  Comparison: None.  Findings: Artifact on the present exam slightly limits evaluation.  No intracranial hemorrhage.  Moderate small vessel disease type changes.  Right frontal lobe infarct of indeterminate age.  Global atrophy without hydrocephalus.  Vascular calcifications.  No intracranial mass lesion detected on this unenhanced exam.  Mastoid air cells, middle ear cavities and visualized sinuses are clear.  IMPRESSION: Artifact on the present exam slightly limits evaluation.  No intracranial hemorrhage.  Moderate small vessel disease type changes.  Right frontal lobe infarct of indeterminate age.  Global atrophy without hydrocephalus.   Original Report Authenticated By: Lacy Duverney, M.D.   Ct Chest Wo Contrast  08/12/2012   *RADIOLOGY REPORT*  Clinical Data: Question loculated pneumonia seen on bedside  ultrasound.  CT CHEST WITHOUT CONTRAST  Technique:  Multidetector CT imaging of the chest was performed following the standard protocol without IV contrast.  Comparison: Chest x-ray from the same day.  Findings:  THORACIC INLET/BODY WALL:  Endotracheal tube terminates in the upper thoracic trachea. Gastric suction tube enters the stomach, which is largely decompressed.  There is a right IJ central venous catheter, tip in the SVC.  MEDIASTINUM:  No definite cardiomegaly.  Trace pericardial effusion or thickening.  Proximal LAD coronary artery atherosclerosis.  Four vessel aortic arch.  No evidence of acute vascular abnormality.  LUNG WINDOWS:  There is dense consolidation in the dependent right lung, affecting the lower lobe, posterior segment right upper lobe, and dependent right middle lobe.  There is linear high density material within the right lower lobe predominately.  Small effusion is present, predominately layering with some fluid extending into the major fissure.  The effusion measures up to 2 cm in thickness along the posterior chest argin.  No definitive PNA cavitation.  Less extensive consolidation, also with high density material, at the left base.  Nonspecific coarsened interstitium in the lingula. Centrilobular emphysema.  No pneumothorax.  UPPER ABDOMEN:  No acute findings.  OSSEOUS:  Diffuse degenerative disc disease.  There are flowing osteophytes or syndesmophytes throughout the thoracic region, with extensive ankylosis.  IMPRESSION: 1. Multifocal pneumonia, right worse than left. The dependent pattern and high density material within the consolidation suggests aspiration.  2. Small right parapneumonic effusion.  3.  Emphysema. 4.  Satisfactory positioning of support apparatus.   Original Report Authenticated By: Tiburcio Pea   US Renal Port  08/11/2012   *RADIOLOGY REPORT*  Clinical Data: 79 year old male with acute renal failure.  RENAL/URINARY TRACT ULTRASOUND COMPLETE  Comparison:  None.   Findings:  Right Kidney:  Central hypo echogenicity favored to reflect peripelvic cysts.  No definite hydronephrosis.  Superimposed exophytic upper pole cyst measuring 2.8 cm.  Right with renal length 11.1 cm.  The cortex cortex appears decreased in thickness and increased in echogenicity.  Left Kidney:  No hydronephrosis.  Renal length 10.4 cm.  Cortical thinning and increased echogenicity similar to the right side.  Bladder:  Not visualized, reportedly a Foley catheter is in place.  IMPRESSION: No definite hydronephrosis or acute renal finding.  Suspect parapelvic renal cysts on the right.  Evidence of chronic medical renal disease.   Original Report Authenticated By: Erskine Speed, M.D.   Dg Chest Port 1 View  08/15/2012   *RADIOLOGY REPORT*  Clinical Data: Post extubation, evaluate lung fields  PORTABLE CHEST - 1 VIEW  Comparison: 08/14/2012; 08/13/2012; 08/11/2012; chest CT - 08/11/2012  Findings:  Grossly unchanged enlarged cardiac silhouette and mediastinal contours with tortuosity of the thoracic aorta.  Interval removal of support apparatus.  No definite pneumothorax.  Bibasilar coarse heterogeneous air space  opacities are grossly unchanged.  Grossly unchanged linear heterogeneous opacity in the right mid lung.  No new focal airspace opacities.  No definite pleural effusion. Unchanged bones.  IMPRESSION: 1.  Interval removal of support apparatus.  No pneumothorax. 2.  Grossly unchanged extensive bibasilar and right mid lung coarse heterogeneous air space opacities worrisome for multifocal infection.  A follow-up chest radiograph in 4 to 6 weeks after treatment is recommended to ensure resolution.   Original Report Authenticated By: Tacey Ruiz, MD   Dg Chest Port 1 View  08/14/2012   *RADIOLOGY REPORT*  Clinical Data: Evaluate lung fields and position of endotracheal tube  PORTABLE CHEST - 1 VIEW  Comparison: Portable chest of 08/13/2012  Findings: There has been further improvement in airspace disease  with persistent small effusions and right mid lung atelectasis. The endotracheal tube is unchanged in position as is the right central venous line.  IMPRESSION: Slightly improved aeration with some improvement in airspace disease.   Original Report Authenticated By: Dwyane Dee, M.D.   Dg Chest Port 1 View  08/13/2012   *RADIOLOGY REPORT*  Clinical Data: Ventilatory support, respiratory failure  PORTABLE CHEST - 1 VIEW  Comparison: 08/12/2012  Findings: Endotracheal tube approximately 3.6 cm above the carina. Right IJ central line and NG tube are stable and position.  Slight interval improvement in right midlung and bibasilar airspace process versus edema.  Small right effusion suspected.  No pneumothorax.  Atherosclerosis of the aorta.  IMPRESSION: Stable support apparatus.  Improving asymmetric airspace process versus edema.   Original Report Authenticated By: Judie Petit. Miles Costain, M.D.   Dg Chest Port 1 View  08/12/2012   *RADIOLOGY REPORT*  Clinical Data: Evaluate lungs and endotracheal tube position  PORTABLE CHEST - 1 VIEW  Comparison: Portable exam 0610 hours compared 08/11/2012  Findings: Tip of endotracheal tube projects 5.6 cm above carina. Enlargement of cardiac silhouette. Calcified tortuous aorta. Pulmonary vascularity normal. Bilateral airspace infiltrates at mid-to-lower lungs greater on right, little changed. Minimal associated left basilar atelectasis. Small right pleural effusion. No pneumothorax.  IMPRESSION: Persistent bibasilar infiltrates right greater than left with minimal left basilar atelectasis and small right pleural effusion. Satisfactory endotracheal tube position.   Original Report Authenticated By: Ulyses Southward, M.D.   Dg Chest Port 1 View  08/11/2012   *RADIOLOGY REPORT*  Clinical Data: Central line placement.  PORTABLE CHEST - 1 VIEW  Comparison: Earlier film of the same day  Findings: Right IJ central line has its tip in the mid SVC.  No pneumothorax.  Endotracheal tube and nasogastric  tube remain in place.  Coarse bibasilar airspace disease, right worse than left as before.  Possible layering right effusion.  Heart size upper limits normal for technique.  IMPRESSION:  1.  Central line to the mid SVC without pneumothorax. 2.  Little change in asymmetric airspace disease.   Original Report Authenticated By: D. Andria Rhein, MD   Dg Chest Port 1 View  08/11/2012   *RADIOLOGY REPORT*  Clinical Data: Intubated, shortness of breath  PORTABLE CHEST - 1 VIEW  Comparison: 08/10/2012; 08/09/2012; 03/01/2008  Findings:  Grossly unchanged enlarged cardiac silhouette and mediastinal contours with atherosclerotic calcifications within a tortuous thoracic aorta. Stable positioning of support apparatus.  Interval worsening of right mid and now right lower lung heterogeneous and consolidative airspace opacities, now is obscuration of the right heart border.  Suspected development of a small right-sided pleural effusion.  Left lower lung heterogeneous air space opacities are grossly unchanged.  No pneumothorax.  Unchanged bones.  IMPRESSION: 1.  Stable positioning of support apparatus.  No pneumothorax. 2.  Worsening right mid and lower lung heterogeneous air space opacities worrisome for progression of multifocal infection, now with development of a small right-sided parapneumonic effusion. 3.  Grossly unchanged left lower lung heterogeneous opacities, atelectasis versus infiltrate.   Original Report Authenticated By: Tacey Ruiz, MD   Dg Chest Port 1 View  08/10/2012   *RADIOLOGY REPORT*  Clinical Data: Intubated.  PORTABLE CHEST - 1 VIEW  Comparison: Yesterday.  Findings: Better visualized endotracheal tube with its tip approximately 4 cm above the carina.  Nasogastric tube tip in the mid stomach.  Increased right upper lobe airspace opacity. Improved aeration of the lung bases.  The cardiac silhouette remains borderline enlarged.  Mildly improved interstitial prominence.  Thoracic spine degenerative  changes.  IMPRESSION:  1.  Endotracheal tube in satisfactory position. 2.  Mildly increased right upper lobe pneumonia. 3.  Improved bibasilar atelectasis.   Original Report Authenticated By: Beckie Salts, M.D.   Dg Chest Portable 1 View  08/09/2012   *RADIOLOGY REPORT*  Clinical Data: Intubated.  PORTABLE CHEST - 1 VIEW  Comparison: 03/01/2008.  Findings: Poorly visualized endotracheal tube with its tip most likely at the level of the clavicles.  Patchy opacity in the right upper lobe.  Minimal patchy opacity at both lung bases.  Increased prominence of the interstitial markings.  The cardiac silhouette remains borderline enlarged.  Thoracic spine degenerative changes.  IMPRESSION:  1.  Poorly visualized endotracheal tube, most likely in satisfactory position. 2.  Right upper lobe probable pneumonia. 3.  Mild bibasilar atelectasis or pneumonia. 4.  Increased interstitial lung disease with stable borderline cardiomegaly.   Original Report Authenticated By: Beckie Salts, M.D.     PERTINENT LAB RESULTS: CBC:  Recent Labs  08/16/12 0530  WBC 9.5  HGB 12.1*  HCT 39.6  PLT 127*   CMET CMP     Component Value Date/Time   NA 144 08/18/2012 0535   K 4.2 08/18/2012 0535   CL 103 08/18/2012 0535   CO2 31 08/18/2012 0535   GLUCOSE 79 08/18/2012 0535   BUN 56* 08/18/2012 0535   CREATININE 4.31* 08/18/2012 0535   CALCIUM 9.1 08/18/2012 0535   PROT 5.9* 08/11/2012 0400   ALBUMIN 2.1* 08/11/2012 0400   AST 13 08/11/2012 0400   ALT 8 08/11/2012 0400   ALKPHOS 48 08/11/2012 0400   BILITOT 0.5 08/11/2012 0400   GFRNONAA 12* 08/18/2012 0535   GFRAA 13* 08/18/2012 0535    GFR Estimated Creatinine Clearance: 13.8 ml/min (by C-G formula based on Cr of 4.31). No results found for this basename: LIPASE, AMYLASE,  in the last 72 hours No results found for this basename: CKTOTAL, CKMB, CKMBINDEX, TROPONINI,  in the last 72 hours No components found with this basename: POCBNP,  No results found for this basename:  DDIMER,  in the last 72 hours No results found for this basename: HGBA1C,  in the last 72 hours No results found for this basename: CHOL, HDL, LDLCALC, TRIG, CHOLHDL, LDLDIRECT,  in the last 72 hours No results found for this basename: TSH, T4TOTAL, FREET3, T3FREE, THYROIDAB,  in the last 72 hours No results found for this basename: VITAMINB12, FOLATE, FERRITIN, TIBC, IRON, RETICCTPCT,  in the last 72 hours Coags: No results found for this basename: PT, INR,  in the last 72 hours Microbiology: Recent Results (from the past 240 hour(s))  CULTURE, BLOOD (ROUTINE X 2)  Status: None   Collection Time    08/09/12  8:19 PM      Result Value Range Status   Specimen Description LEFT ANTECUBITAL   Final   Special Requests BOTTLES DRAWN AEROBIC AND ANAEROBIC 8CC   Final   Culture  Setup Time 08/09/2012 20:19   Final   Culture     Final   Value: STREPTOCOCCUS PNEUMONIAE     Note: SUSCEPTIBILITIES PERFORMED ON PREVIOUS CULTURE WITHIN THE LAST 5 DAYS.     Note: Gram Stain Report Called to,Read Back By and Verified With: BECK A 08/10/12 0945 BY THOMPSONS Performed at [facility]   Report Status 08/12/2012 FINAL   Final  CULTURE, BLOOD (ROUTINE X 2)     Status: None   Collection Time    08/09/12  8:28 PM      Result Value Range Status   Specimen Description BLOOD RIGHT WRIST   Final   Special Requests BOTTLES DRAWN AEROBIC AND ANAEROBIC 8CC   Final   Culture  Setup Time 08/09/2012 20:28   Final   Culture     Final   Value: STREPTOCOCCUS PNEUMONIAE     Note: Gram Stain Report Called to,Read Back By and Verified With: BECK A 08/10/12 0945 BY THOMPSONS Performed at [facility]   Report Status 08/12/2012 FINAL   Final   Organism ID, Bacteria STREPTOCOCCUS PNEUMONIAE   Final  URINE CULTURE     Status: None   Collection Time    08/09/12  9:23 PM      Result Value Range Status   Specimen Description URINE, CATHETERIZED   Final   Special Requests NONE   Final   Culture  Setup Time  08/10/2012 23:38   Final   Colony Count NO GROWTH   Final   Culture NO GROWTH   Final   Report Status 08/11/2012 FINAL   Final  MRSA PCR SCREENING     Status: Abnormal   Collection Time    08/10/12 12:53 AM      Result Value Range Status   MRSA by PCR POSITIVE (*) NEGATIVE Final   Comment:            The GeneXpert MRSA Assay (FDA     approved for NASAL specimens     only), is one component of a     comprehensive MRSA colonization     surveillance program. It is not     intended to diagnose MRSA     infection nor to guide or     monitor treatment for     MRSA infections.     RESULT CALLED TO, READ BACK BY AND VERIFIED WITH:     CLAUDIO,J RN [id number] 08/10/12 MITCHELL,L  CULTURE, RESPIRATORY (NON-EXPECTORATED)     Status: None   Collection Time    08/10/12  1:10 AM      Result Value Range Status   Specimen Description ENDOTRACHEAL ASPIRATE   Final   Special Requests NONE   Final   Gram Stain     Final   Value: RARE WBC PRESENT, PREDOMINANTLY PMN     RARE SQUAMOUS EPITHELIAL CELLS PRESENT     RARE GRAM POSITIVE COCCI     IN PAIRS   Culture     Final   Value: MODERATE STREPTOCOCCUS PNEUMONIAE     FEW YEAST CONSISTENT WITH CANDIDA SPECIES   Report Status 08/13/2012 FINAL   Final   Organism ID, Bacteria STREPTOCOCCUS PNEUMONIAE   Final  URINE CULTURE     Status: None   Collection Time    08/10/12  7:50 AM      Result Value Range Status   Specimen Description URINE, CATHETERIZED   Final   Special Requests NONE   Final   Culture  Setup Time 08/10/2012 20:49   Final   Colony Count NO GROWTH   Final   Culture NO GROWTH   Final   Report Status 08/11/2012 FINAL   Final  CULTURE, RESPIRATORY (NON-EXPECTORATED)     Status: None   Collection Time    08/10/12  4:30 PM      Result Value Range Status   Specimen Description TRACHEAL ASPIRATE   Final   Special Requests Normal   Final   Gram Stain     Final   Value: RARE WBC PRESENT,BOTH PMN AND MONONUCLEAR     RARE SQUAMOUS EPITHELIAL  CELLS PRESENT     RARE GRAM POSITIVE COCCI     IN PAIRS   Culture Non-Pathogenic Oropharyngeal-type Flora Isolated.   Final   Report Status 08/12/2012 FINAL   Final  CLOSTRIDIUM DIFFICILE BY PCR     Status: None   Collection Time    08/18/12  6:36 AM      Result Value Range Status   C difficile by pcr NEGATIVE  NEGATIVE Final     BRIEF HOSPITAL COURSE:  Acute respiratory failure with hypoxia  -2/2 Comm Acq PNA  -required intubation/mech ventilation on admission, Extubated on 7/17  -currently doing well with just 2 L of O2 via [place]   Septic Shock  -2/2 PNA/Strep Pneumoniae Bacteremia  -resolved-initially required Central Line placement and pressors  -BP stable-infact has been resumed on Metoprolol   Severe Comm Acq PNA  -Both Sputum and Blood cultures positive for Strep Pneumoniae Bacteremia  -on admission required broad spectrum antibiotics-currently narrowed down to Amoxicillin till 7/20. 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 IVF has now been discontinued  -per PCCM note-patient does not want HD under any circumstance   Streptococcus Bacteremia  -repeat blood cultures today-24 hours off antibiotics. If has persistent bacteremia then may need ID consultation and TEE.   Hypernatremia  -encourage PO intake  -resolved    Thrombocytopenia/Anemia  -2/2 critical illness  -resolving   COPD  -lungs clear  -stable currently  -c/w Spiriva  -prn Albuterol   HTN  -BP now stable with Metoprolol  TODAY-DAY OF DISCHARGE:  Subjective:   Brad Jones today has no headache,no chest abdominal pain,no new weakness tingling or numbness, feels much better wants to go home today.  Objective:   Blood pressure 120/76, pulse 76, temperature 98.5 F (36.9 C), temperature source Oral, resp. rate 18, height 5\' 8"  (1.727 m), weight 82.6 kg (182 lb 1.6 oz), SpO2 98.00%.  Intake/Output Summary (Last 24 hours) at 08/18/12 1103 Last data filed at  08/17/12 1300  Gross per 24 hour  Intake      0 ml  Output    450 ml  Net   -450 ml   Filed Weights   08/13/12 0400 08/14/12 0400 08/15/12 0500  Weight: 95.6 kg (210 lb 12.2 oz) 89.4 kg (197 lb 1.5 oz) 82.6 kg (182 lb 1.6 oz)    Exam Awake Alert, Oriented *3, No new F.N deficits, Normal affect [place].AT,PERRAL Supple Neck,No JVD, No cervical lymphadenopathy appriciated.  Symmetrical Chest wall movement, Good air movement bilaterally, CTAB RRR,No Gallops,Rubs or new Murmurs, No Parasternal Heave +ve B.Sounds, Abd Soft, Non tender, No organomegaly appriciated, No rebound -guarding or rigidity. No Cyanosis, Clubbing or edema, No new Rash or bruise  DISCHARGE CONDITION: Stable  DISPOSITION: CIR  DISCHARGE INSTRUCTIONS:    Activity:  As tolerated with Full fall precautions use walker/cane & assistance as needed  Diet recommendation: Diabetic Diet Heart Healthy diet  Follow-up Information   Schedule an appointment as soon as possible for a visit in 2 weeks to follow up. (after discharge from CIR)    Contact information:   Primary MD        Total Time spent on discharge equals 45 minutes.  SignedJeoffrey Massed 08/18/2012 11:03 AM

## 2012-08-18 NOTE — Progress Notes (Addendum)
PATIENT DETAILS Name: Brad Jones Age: 79 y.o. Sex: male Date of Birth: 1927/11/21 Admit Date: 08/09/2012 Admitting Physician Lupita Leash, MD ZOX:[id number], Provider, MD  Subjective: No major complaints-except for 2 loose stools overnight-none this am  Assessment/Plan: Principal Problem:   Acute respiratory failure with hypoxia -2/2 Comm Acq PNA -required intubation/mech ventilation on admission, Extubated on 7/17 -currently doing well with just 2 L of O2 via [place]  Active Problems: Septic Shock -2/2 PNA/Strep Pneumoniae Bacteremia -resolved-initially required Central Line placement and pressors -BP stable-infact has been resumed on Metoprolol  Severe Comm Acq PNA  -Both Sputum and Blood cultures positive for Strep Pneumoniae Bacteremia -on admission required broad spectrum antibiotics-currently narrowed down to Amoxicillin till 7/20. 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 IVF has now been discontinued -per PCCM note-patient does not want HD under any circumstance  Streptococcus Bacteremia -repeat blood cultures today-24 hours off antibiotics. If has persistent bacteremia then may need ID consultation and TEE.  Hypernatremia -encourage PO intake -resolved  Thrombocytopenia/Anemia -2/2 critical illness -resolving  Diarrhea -C  Diff PCR neg -Trial of probiotics-if still persistent-prn Imodium  COPD -lungs clear -stable currently -c/w Spiriva -prn Albuterol  HTN -BP now stable with Metoprolol  Disposition: Remain inpatient-CIR when bed available.  DVT Prophylaxis: Prophylactic Heparin   Code Status: Full code   Family Communication None at bedside  Procedures:  None  CONSULTS:  None   MEDICATIONS: Scheduled Meds: . antiseptic oral rinse  15 mL Mouth Rinse BID  . aspirin  81 mg Per Tube Daily  . heparin  5,000 Units Subcutaneous Q8H  . metoprolol tartrate  50 mg Oral BID  . multivitamin with minerals  1 tablet Per Tube Daily  . saccharomyces boulardii  250 mg Oral BID  . tiotropium  18 mcg Inhalation Daily   Continuous Infusions:  PRN Meds:.albuterol  Antibiotics: Anti-infectives   Start     Dose/Rate Route Frequency Ordered Stop   08/15/12 1045  amoxicillin (AMOXIL) capsule 500 mg     500 mg Oral Every 12 hours 08/15/12 1013 08/16/12 2119   08/10/12 2200  azithromycin (ZITHROMAX) 500 mg in dextrose 5 % 250 mL IVPB  Status:  Discontinued     500 mg 250 mL/hr over 60 Minutes Intravenous Every 24 hours 08/10/12 0109 08/11/12 0956   08/10/12 1800  piperacillin-tazobactam (ZOSYN) IVPB 2.25 g  Status:  Discontinued     2.25 g 100 mL/hr over 30 Minutes Intravenous 4 times per day 08/10/12 1213 08/15/12 1013   08/10/12 1215  vancomycin (VANCOCIN) 1,500 mg in sodium chloride 0.9 % 500 mL IVPB  Status:  Discontinued     1,500 mg 250 mL/hr over 120 Minutes Intravenous Every 48 hours 08/10/12 1213 08/13/12 0805   08/10/12 1000  cefTRIAXone (ROCEPHIN) injection 1 g  Status:  Discontinued     1 g Intramuscular Every 12 hours 08/10/12 0109 08/10/12 0115   08/10/12 1000  cefTRIAXone (  ROCEPHIN) 1 g in dextrose 5 % 50 mL IVPB  Status:  Discontinued     1 g 100 mL/hr over 30 Minutes Intravenous Every 12 hours 08/10/12 0115 08/10/12 1149   08/09/12 2045  cefTRIAXone (ROCEPHIN) 1 g in dextrose 5 %  50 mL IVPB     1 g 100 mL/hr over 30 Minutes Intravenous  Once 08/09/12 2034 08/09/12 2125   08/09/12 2045  azithromycin (ZITHROMAX) 500 mg in dextrose 5 % 250 mL IVPB     500 mg 250 mL/hr over 60 Minutes Intravenous  Once 08/09/12 2034 08/09/12 2231       PHYSICAL EXAM: Vital signs in last 24 hours: Filed Vitals:   08/17/12 1447 08/17/12 2103 08/18/12 0555 08/18/12 0952  BP: 105/58 110/68 120/76   Pulse: 71 76 76   Temp: 98.2 F (36.8 C) 99.3 F (37.4 C) 98.5 F (36.9 C)   TempSrc: Oral Oral Oral   Resp: 18 18 18    Height:      Weight:      SpO2: 96% 98% 97% 98%    Weight change:  Filed Weights   08/13/12 0400 08/14/12 0400 08/15/12 0500  Weight: 95.6 kg (210 lb 12.2 oz) 89.4 kg (197 lb 1.5 oz) 82.6 kg (182 lb 1.6 oz)   Body mass index is 27.69 kg/(m^2).   Gen Exam: Awake and alert with clear speech.   Neck: Supple, No JVD.   Chest: B/L Clear.   CVS: S1 S2 Regular, no murmurs.  Abdomen: soft, BS +, non tender, non distended.  Extremities: no edema, lower extremities warm to touch. Neurologic: Non Focal.  Skin: No Rash. Psoriasis plaque in ant b/l knees  Wounds: N/A.    Intake/Output from previous day:  Intake/Output Summary (Last 24 hours) at 08/18/12 1053 Last data filed at 08/17/12 1300  Gross per 24 hour  Intake      0 ml  Output    450 ml  Net   -450 ml     LAB RESULTS: CBC  Recent Labs Lab 08/12/12 0400 08/13/12 0423 08/14/12 0415 08/15/12 0425 08/16/12 0530  WBC 11.7* 11.3* 10.2 11.0* 9.5  HGB 10.1* 10.9* 11.5* 12.1* 12.1*  HCT 31.3* 33.3* 35.3* 40.2 39.6  PLT 84* 80* 94* 106* 127*  MCV 77.9* 78.4 78.3 80.9 81.5  MCH 25.1* 25.6* 25.5* 24.3* 24.9*  MCHC 32.3 32.7 32.6 30.1 30.6  RDW 15.3 15.4 15.3 15.4 15.3    Chemistries   Recent Labs Lab 08/14/12 0415 08/15/12 0425 08/16/12 0530 08/17/12 0605 08/18/12 0535  NA 149* 151* 150* 143 144  K 3.6 3.8 3.4* 4.1 4.2  CL 111 106 103 103 103  CO2 28 37* 38* 29 31  GLUCOSE 114* 101*  99 88 79  BUN 75* 71* 70* 60* 56*  CREATININE 4.50* 4.81* 5.10* 4.48* 4.31*  CALCIUM 9.4 9.5 9.2 9.2 9.1    CBG:  Recent Labs Lab 08/17/12 1141 08/17/12 1737 08/18/12 0805 08/18/12 0830 08/18/12 0854  GLUCAP 144* 133* 69* 68* 102*    GFR Estimated Creatinine Clearance: 13.8 ml/min (by C-G formula based on Cr of 4.31).  Coagulation profile No results found for this basename: INR, PROTIME,  in the last 168 hours  Cardiac Enzymes No results found for this basename: CK, CKMB, TROPONINI, MYOGLOBIN,  in the last 168 hours  No components found with this basename: POCBNP,  No results found for this basename: DDIMER,  in the last 72 hours No results found for this basename:  HGBA1C,  in the last 72 hours No results found for this basename: CHOL, HDL, LDLCALC, TRIG, CHOLHDL, LDLDIRECT,  in the last 72 hours No results found for this basename: TSH, T4TOTAL, FREET3, T3FREE, THYROIDAB,  in the last 72 hours No results found for this basename: VITAMINB12, FOLATE, FERRITIN, TIBC, IRON, RETICCTPCT,  in the last 72 hours No results found for this basename: LIPASE, AMYLASE,  in the last 72 hours  Urine Studies No results found for this basename: UACOL, UAPR, USPG, UPH, UTP, UGL, UKET, UBIL, UHGB, UNIT, UROB, ULEU, UEPI, UWBC, URBC, UBAC, CAST, CRYS, UCOM, BILUA,  in the last 72 hours  MICROBIOLOGY: Recent Results (from the past 240 hour(s))  CULTURE, BLOOD (ROUTINE X 2)     Status: None   Collection Time    08/09/12  8:19 PM      Result Value Range Status   Specimen Description LEFT ANTECUBITAL   Final   Special Requests BOTTLES DRAWN AEROBIC AND ANAEROBIC 8CC   Final   Culture  Setup Time 08/09/2012 20:19   Final   Culture     Final   Value: STREPTOCOCCUS PNEUMONIAE     Note: SUSCEPTIBILITIES PERFORMED ON PREVIOUS CULTURE WITHIN THE LAST 5 DAYS.     Note: Gram Stain Report Called to,Read Back By and Verified With: BECK A 08/10/12 0945 BY THOMPSONS Performed at [facility]    Report Status 08/12/2012 FINAL   Final  CULTURE, BLOOD (ROUTINE X 2)     Status: None   Collection Time    08/09/12  8:28 PM      Result Value Range Status   Specimen Description BLOOD RIGHT WRIST   Final   Special Requests BOTTLES DRAWN AEROBIC AND ANAEROBIC 8CC   Final   Culture  Setup Time 08/09/2012 20:28   Final   Culture     Final   Value: STREPTOCOCCUS PNEUMONIAE     Note: Gram Stain Report Called to,Read Back By and Verified With: BECK A 08/10/12 0945 BY THOMPSONS Performed at [facility]   Report Status 08/12/2012 FINAL   Final   Organism ID, Bacteria STREPTOCOCCUS PNEUMONIAE   Final  URINE CULTURE     Status: None   Collection Time    08/09/12  9:23 PM      Result Value Range Status   Specimen Description URINE, CATHETERIZED   Final   Special Requests NONE   Final   Culture  Setup Time 08/10/2012 23:38   Final   Colony Count NO GROWTH   Final   Culture NO GROWTH   Final   Report Status 08/11/2012 FINAL   Final  MRSA PCR SCREENING     Status: Abnormal   Collection Time    08/10/12 12:53 AM      Result Value Range Status   MRSA by PCR POSITIVE (*) NEGATIVE Final   Comment:            The GeneXpert MRSA Assay (FDA     approved for NASAL specimens     only), is one component of a     comprehensive MRSA colonization     surveillance program. It is not     intended to diagnose MRSA     infection nor to guide or     monitor treatment for     MRSA infections.     RESULT CALLED TO, READ BACK BY AND VERIFIED WITH:     CLAUDIO,J RN [id number] 08/10/12 MITCHELL,L  CULTURE, RESPIRATORY (  NON-EXPECTORATED)     Status: None   Collection Time    08/10/12  1:10 AM      Result Value Range Status   Specimen Description ENDOTRACHEAL ASPIRATE   Final   Special Requests NONE   Final   Gram Stain     Final   Value: RARE WBC PRESENT, PREDOMINANTLY PMN     RARE SQUAMOUS EPITHELIAL CELLS PRESENT     RARE GRAM POSITIVE COCCI     IN PAIRS   Culture     Final   Value: MODERATE  STREPTOCOCCUS PNEUMONIAE     FEW YEAST CONSISTENT WITH CANDIDA SPECIES   Report Status 08/13/2012 FINAL   Final   Organism ID, Bacteria STREPTOCOCCUS PNEUMONIAE   Final  URINE CULTURE     Status: None   Collection Time    08/10/12  7:50 AM      Result Value Range Status   Specimen Description URINE, CATHETERIZED   Final   Special Requests NONE   Final   Culture  Setup Time 08/10/2012 20:49   Final   Colony Count NO GROWTH   Final   Culture NO GROWTH   Final   Report Status 08/11/2012 FINAL   Final  CULTURE, RESPIRATORY (NON-EXPECTORATED)     Status: None   Collection Time    08/10/12  4:30 PM      Result Value Range Status   Specimen Description TRACHEAL ASPIRATE   Final   Special Requests Normal   Final   Gram Stain     Final   Value: RARE WBC PRESENT,BOTH PMN AND MONONUCLEAR     RARE SQUAMOUS EPITHELIAL CELLS PRESENT     RARE GRAM POSITIVE COCCI     IN PAIRS   Culture Non-Pathogenic Oropharyngeal-type Flora Isolated.   Final   Report Status 08/12/2012 FINAL   Final  CLOSTRIDIUM DIFFICILE BY PCR     Status: None   Collection Time    08/18/12  6:36 AM      Result Value Range Status   C difficile by pcr NEGATIVE  NEGATIVE Final    RADIOLOGY STUDIES/RESULTS: Ct Head Wo Contrast  08/09/2012   *RADIOLOGY REPORT*  Clinical Data: Unresponsive.  On ventilator.  Hypertension. Multiple prior infarcts.  Prostate cancer.  CT HEAD WITHOUT CONTRAST  Technique:  Contiguous axial images were obtained from the base of the skull through the vertex without contrast.  Comparison: None.  Findings: Artifact on the present exam slightly limits evaluation.  No intracranial hemorrhage.  Moderate small vessel disease type changes.  Right frontal lobe infarct of indeterminate age.  Global atrophy without hydrocephalus.  Vascular calcifications.  No intracranial mass lesion detected on this unenhanced exam.  Mastoid air cells, middle ear cavities and visualized sinuses are clear.  IMPRESSION: Artifact on  the present exam slightly limits evaluation.  No intracranial hemorrhage.  Moderate small vessel disease type changes.  Right frontal lobe infarct of indeterminate age.  Global atrophy without hydrocephalus.   Original Report Authenticated By: Lacy Duverney, M.D.   Ct Chest Wo Contrast  08/12/2012   *RADIOLOGY REPORT*  Clinical Data: Question loculated pneumonia seen on bedside ultrasound.  CT CHEST WITHOUT CONTRAST  Technique:  Multidetector CT imaging of the chest was performed following the standard protocol without IV contrast.  Comparison: Chest x-ray from the same day.  Findings:  THORACIC INLET/BODY WALL:  Endotracheal tube terminates in the upper thoracic trachea. Gastric suction tube enters the stomach, which is largely decompressed.  There is  a right IJ central venous catheter, tip in the SVC.  MEDIASTINUM:  No definite cardiomegaly.  Trace pericardial effusion or thickening.  Proximal LAD coronary artery atherosclerosis.  Four vessel aortic arch.  No evidence of acute vascular abnormality.  LUNG WINDOWS:  There is dense consolidation in the dependent right lung, affecting the lower lobe, posterior segment right upper lobe, and dependent right middle lobe.  There is linear high density material within the right lower lobe predominately.  Small effusion is present, predominately layering with some fluid extending into the major fissure.  The effusion measures up to 2 cm in thickness along the posterior chest argin.  No definitive PNA cavitation.  Less extensive consolidation, also with high density material, at the left base.  Nonspecific coarsened interstitium in the lingula. Centrilobular emphysema.  No pneumothorax.  UPPER ABDOMEN:  No acute findings.  OSSEOUS:  Diffuse degenerative disc disease.  There are flowing osteophytes or syndesmophytes throughout the thoracic region, with extensive ankylosis.  IMPRESSION: 1. Multifocal pneumonia, right worse than left. The dependent pattern and high density  material within the consolidation suggests aspiration.  2. Small right parapneumonic effusion.  3.  Emphysema. 4.  Satisfactory positioning of support apparatus.   Original Report Authenticated By: Tiburcio Pea   US Renal Port  08/11/2012   *RADIOLOGY REPORT*  Clinical Data: 79 year old male with acute renal failure.  RENAL/URINARY TRACT ULTRASOUND COMPLETE  Comparison:  None.  Findings:  Right Kidney:  Central hypo echogenicity favored to reflect peripelvic cysts.  No definite hydronephrosis.  Superimposed exophytic upper pole cyst measuring 2.8 cm.  Right with renal length 11.1 cm.  The cortex cortex appears decreased in thickness and increased in echogenicity.  Left Kidney:  No hydronephrosis.  Renal length 10.4 cm.  Cortical thinning and increased echogenicity similar to the right side.  Bladder:  Not visualized, reportedly a Foley catheter is in place.  IMPRESSION: No definite hydronephrosis or acute renal finding.  Suspect parapelvic renal cysts on the right.  Evidence of chronic medical renal disease.   Original Report Authenticated By: Erskine Speed, M.D.   Dg Chest Port 1 View  08/15/2012   *RADIOLOGY REPORT*  Clinical Data: Post extubation, evaluate lung fields  PORTABLE CHEST - 1 VIEW  Comparison: 08/14/2012; 08/13/2012; 08/11/2012; chest CT - 08/11/2012  Findings:  Grossly unchanged enlarged cardiac silhouette and mediastinal contours with tortuosity of the thoracic aorta.  Interval removal of support apparatus.  No definite pneumothorax.  Bibasilar coarse heterogeneous air space opacities are grossly unchanged.  Grossly unchanged linear heterogeneous opacity in the right mid lung.  No new focal airspace opacities.  No definite pleural effusion. Unchanged bones.  IMPRESSION: 1.  Interval removal of support apparatus.  No pneumothorax. 2.  Grossly unchanged extensive bibasilar and right mid lung coarse heterogeneous air space opacities worrisome for multifocal infection.  A follow-up chest  radiograph in 4 to 6 weeks after treatment is recommended to ensure resolution.   Original Report Authenticated By: Tacey Ruiz, MD   Dg Chest Port 1 View  08/14/2012   *RADIOLOGY REPORT*  Clinical Data: Evaluate lung fields and position of endotracheal tube  PORTABLE CHEST - 1 VIEW  Comparison: Portable chest of 08/13/2012  Findings: There has been further improvement in airspace disease with persistent small effusions and right mid lung atelectasis. The endotracheal tube is unchanged in position as is the right central venous line.  IMPRESSION: Slightly improved aeration with some improvement in airspace disease.   Original Report Authenticated By:  Dwyane Dee, M.D.   Dg Chest Port 1 View  08/13/2012   *RADIOLOGY REPORT*  Clinical Data: Ventilatory support, respiratory failure  PORTABLE CHEST - 1 VIEW  Comparison: 08/12/2012  Findings: Endotracheal tube approximately 3.6 cm above the carina. Right IJ central line and NG tube are stable and position.  Slight interval improvement in right midlung and bibasilar airspace process versus edema.  Small right effusion suspected.  No pneumothorax.  Atherosclerosis of the aorta.  IMPRESSION: Stable support apparatus.  Improving asymmetric airspace process versus edema.   Original Report Authenticated By: Judie Petit. Miles Costain, M.D.   Dg Chest Port 1 View  08/12/2012   *RADIOLOGY REPORT*  Clinical Data: Evaluate lungs and endotracheal tube position  PORTABLE CHEST - 1 VIEW  Comparison: Portable exam 0610 hours compared 08/11/2012  Findings: Tip of endotracheal tube projects 5.6 cm above carina. Enlargement of cardiac silhouette. Calcified tortuous aorta. Pulmonary vascularity normal. Bilateral airspace infiltrates at mid-to-lower lungs greater on right, little changed. Minimal associated left basilar atelectasis. Small right pleural effusion. No pneumothorax.  IMPRESSION: Persistent bibasilar infiltrates right greater than left with minimal left basilar atelectasis and small  right pleural effusion. Satisfactory endotracheal tube position.   Original Report Authenticated By: Ulyses Southward, M.D.   Dg Chest Port 1 View  08/11/2012   *RADIOLOGY REPORT*  Clinical Data: Central line placement.  PORTABLE CHEST - 1 VIEW  Comparison: Earlier film of the same day  Findings: Right IJ central line has its tip in the mid SVC.  No pneumothorax.  Endotracheal tube and nasogastric tube remain in place.  Coarse bibasilar airspace disease, right worse than left as before.  Possible layering right effusion.  Heart size upper limits normal for technique.  IMPRESSION:  1.  Central line to the mid SVC without pneumothorax. 2.  Little change in asymmetric airspace disease.   Original Report Authenticated By: D. Andria Rhein, MD   Dg Chest Port 1 View  08/11/2012   *RADIOLOGY REPORT*  Clinical Data: Intubated, shortness of breath  PORTABLE CHEST - 1 VIEW  Comparison: 08/10/2012; 08/09/2012; 03/01/2008  Findings:  Grossly unchanged enlarged cardiac silhouette and mediastinal contours with atherosclerotic calcifications within a tortuous thoracic aorta. Stable positioning of support apparatus.  Interval worsening of right mid and now right lower lung heterogeneous and consolidative airspace opacities, now is obscuration of the right heart border.  Suspected development of a small right-sided pleural effusion.  Left lower lung heterogeneous air space opacities are grossly unchanged.  No pneumothorax.  Unchanged bones.  IMPRESSION: 1.  Stable positioning of support apparatus.  No pneumothorax. 2.  Worsening right mid and lower lung heterogeneous air space opacities worrisome for progression of multifocal infection, now with development of a small right-sided parapneumonic effusion. 3.  Grossly unchanged left lower lung heterogeneous opacities, atelectasis versus infiltrate.   Original Report Authenticated By: Tacey Ruiz, MD   Dg Chest Port 1 View  08/10/2012   *RADIOLOGY REPORT*  Clinical Data: Intubated.   PORTABLE CHEST - 1 VIEW  Comparison: Yesterday.  Findings: Better visualized endotracheal tube with its tip approximately 4 cm above the carina.  Nasogastric tube tip in the mid stomach.  Increased right upper lobe airspace opacity. Improved aeration of the lung bases.  The cardiac silhouette remains borderline enlarged.  Mildly improved interstitial prominence.  Thoracic spine degenerative changes.  IMPRESSION:  1.  Endotracheal tube in satisfactory position. 2.  Mildly increased right upper lobe pneumonia. 3.  Improved bibasilar atelectasis.   Original Report Authenticated By:  Beckie Salts, M.D.   Dg Chest Portable 1 View  08/09/2012   *RADIOLOGY REPORT*  Clinical Data: Intubated.  PORTABLE CHEST - 1 VIEW  Comparison: 03/01/2008.  Findings: Poorly visualized endotracheal tube with its tip most likely at the level of the clavicles.  Patchy opacity in the right upper lobe.  Minimal patchy opacity at both lung bases.  Increased prominence of the interstitial markings.  The cardiac silhouette remains borderline enlarged.  Thoracic spine degenerative changes.  IMPRESSION:  1.  Poorly visualized endotracheal tube, most likely in satisfactory position. 2.  Right upper lobe probable pneumonia. 3.  Mild bibasilar atelectasis or pneumonia. 4.  Increased interstitial lung disease with stable borderline cardiomegaly.   Original Report Authenticated By: Beckie Salts, M.D.    Jeoffrey Massed, MD  [facility] Pager:[phone]  If 7PM-7AM, please contact night-coverage [email or web address] Password [facility] 08/18/2012, 10:53 AM   LOS: 9 days

## 2012-08-18 NOTE — H&P (View-Only) (Signed)
Physical Medicine and Rehabilitation Admission H&P    Chief Complaint  Patient presents with  . Deconditioning.   : HPI:  Brad Jones is a 79 y.o. male with history of HTN, DM, COPD, macular degeneration, CKD (does not want dialysis); admitted on 08/10/12 with h/o malaise, syncope leading to unresponsiveness in setting of severe CAP and acute on chronic renal failure. Patient intubated at [facility] and transferred to [facility] for treatment. He was treated with IV antibiotics for sepsis due to strep bacteremia. CT head with global atrophy with right frontal lobe infarct of indeterminate age and moderate SVD. CT chest with multifocal PNA with consolidation concerning for aspiration. 2D echo with EF 50-55% with grade 1 diastolic dysfunction and severe pulmonary HTN. Extubated on 07/17 and encephalopathy due to sepsis resolving. Fluid overload treated with diuresis. Patient reported worsening of vision as well as ataxia with activity. He developed diarrhea this weekend and rectal tube placed to help with symptoms.  C-diff negative. Therapy team recommended CIR due to deconditioned state and patient admitted today.    Review of Systems  HENT: Negative for hearing loss.   Eyes: Positive for blurred vision (due to macular degeneration--worse but getting better. ).  Respiratory: Positive for shortness of breath. Negative for cough.   Cardiovascular: Negative for chest pain and palpitations.  Gastrointestinal: Positive for heartburn and diarrhea.  Genitourinary: Positive for urgency and frequency.  Musculoskeletal: Positive for myalgias and joint pain (recent right ankle pain/swelling.).  Neurological: Positive for weakness. Negative for dizziness and headaches.  Psychiatric/Behavioral: The patient is not nervous/anxious and does not have insomnia.    Past Medical History  Diagnosis Date  . Hypertension   . CHF (congestive heart failure)   . COPD (chronic obstructive pulmonary disease)   . Emphysema   .  Diabetes mellitus without complication     non insulin dependent  . Cancer     prostate  . Renal disorder kidneys only function at 20%    pt does not want dialysis  . Stroke     TIA, multiple   Past Surgical History  Procedure Laterality Date  . Prostatectomy     History reviewed. No pertinent family history.  Social History: Married. Lives in [place]. 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 He reports that he has quit smoking about 30 years ago. He does not have any smokeless tobacco history on file. He reports that he does not drink alcohol. His drug history is not on file.    Allergies: No Known Allergies  Medications Prior to Admission  Medication Sig Dispense Refill  . albuterol (PROVENTIL HFA;VENTOLIN HFA) 108 (90 BASE) MCG/ACT inhaler Inhale 2 puffs into the lungs every 6 (six) hours as needed for wheezing.      . aspirin 81 MG tablet Take 81 mg by mouth daily.      . calcitRIOL (ROCALTROL) 0.25 MCG capsule Take 0.25 mcg by mouth daily.      . docusate sodium (COLACE) 50 MG capsule Take by mouth at bedtime.      . fluocinonide ointment (LIDEX) 0.05 % Apply topically 2 (two) times daily.      . furosemide (LASIX) 40 MG tablet Take 40 mg by mouth daily.      . HYDROcodone-acetaminophen (NORCO/VICODIN) 5-325 MG per tablet Take 1 tablet by mouth every 6 (six) hours as needed for pain.      . metoprolol (TOPROL-XL) 200 MG 24 hr tablet Take 200 mg by   mouth daily.      . nitroGLYCERIN (NITROSTAT) 0.3 MG SL tablet Place 0.3 mg under the tongue every 5 (five) minutes as needed for chest pain.      . terazosin (HYTRIN) 2 MG capsule Take 2 mg by mouth at bedtime.      . tiotropium (SPIRIVA) 18 MCG inhalation capsule Place 18 mcg into inhaler and inhale daily.        Home: Home Living Family/patient expects to be discharged to:: Private residence Living Arrangements: Spouse/significant other Available Help at Discharge: Available 24 hours/day Type of  Home: House Home Access: Stairs to enter Entrance Stairs-Number of Steps: 3 Entrance Stairs-Rails: None Home Layout: One level Home Equipment: Walker - 2 wheels;Cane - single point;Bedside commode   Functional History: Prior Function Comments: wife assist as needed with LB ADL  Functional Status:  Mobility: Bed Mobility Bed Mobility: Rolling Right;Rolling Left;Scooting to HOB Rolling Right: 4: Min assist Rolling Left: 4: Min assist Supine to Sit: 4: Min assist;HOB elevated Sitting - Scoot to Edge of Bed: 4: Min assist;With rail Scooting to HOB: 3: Mod assist Transfers Transfers: Not assessed Sit to Stand: 1: +2 Total assist;From bed;With upper extremity assist Sit to Stand: Patient Percentage: 30% Stand to Sit: 1: +2 Total assist;To chair/3-in-1 Stand to Sit: Patient Percentage: 30% Ambulation/Gait Ambulation/Gait Assistance: 1: +2 Total assist Ambulation/Gait: Patient Percentage: 30% Ambulation Distance (Feet): 2 Feet Assistive device: Other (Comment) (pushing wheelchair) Ambulation/Gait Assistance Details: assist for balance due to right and posterior lean, assist to keep from tipping wheelchair and to keep anterior weight shift on feet; demosntrate ataxic pattern with scissoring right foot in front of left and inability to maintain upright posture Gait Pattern: Step-to pattern;Decreased stride length;Shuffle;Scissoring;Ataxic;Narrow base of support;Lateral trunk lean to right    ADL: ADL Eating/Feeding: Supervision/safety;Set up Where Assessed - Eating/Feeding: Chair Grooming: Minimal assistance Where Assessed - Grooming: Supported sitting Upper Body Bathing: Minimal assistance Where Assessed - Upper Body Bathing: Supported sitting Lower Body Bathing: Maximal assistance Where Assessed - Lower Body Bathing: Supported sit to stand Upper Body Dressing: Moderate assistance Where Assessed - Upper Body Dressing: Supported sitting Lower Body Dressing: Maximal  assistance Where Assessed - Lower Body Dressing: Supported sit to stand Toilet Transfer: +2 Total assistance;Simulated Equipment Used: Gait belt;Wheelchair Transfers/Ambulation Related to ADLs: +2 total A ADL Comments: functional decline  Cognition: Cognition Overall Cognitive Status: No family/caregiver present to determine baseline cognitive functioning Orientation Level: Oriented to person;Oriented to place;Oriented to situation Cognition Arousal/Alertness: Awake/alert Behavior During Therapy: WFL for tasks assessed/performed Overall Cognitive Status: No family/caregiver present to determine baseline cognitive functioning  Physical Exam: Blood pressure 120/76, pulse 76, temperature 98.5 F (36.9 C), temperature source Oral, resp. rate 18, height 5' 8" (1.727 m), weight 82.6 kg (182 lb 1.6 oz), SpO2 98.00%.  Physical Exam  Nursing note and vitals reviewed.  Constitutional: He appears well-developed and well-nourished.  Elderly, frail appearing male  HENT:  Head: Normocephalic and atraumatic.  Eyes: Conjunctivae and EOM are normal. Pupils are equal, round, and reactive to light.  Neck: Normal range of motion. Neck supple.  Cardiovascular: Normal rate and regular rhythm. No wheezes or rales Pulmonary/Chest: Effort normal. No respiratory distress. He has wheezes.  Congested cough.  Abdominal: Soft. Bowel sounds are normal. He exhibits no distension. There is no tenderness.  Musculoskeletal: He exhibits no edema and no tenderness.  Bilateral knees with large psoriatic patches. No pain with ROM.  Neurological: He is alert.  Oriented to self and place, reason   he's here. Ataxia with finger to left greater than right. RUE 4/5. LUE is 3+/5. LLE is 2+ to 3/5 prox to 4- at ankle. RLE is 3/5 prox to 4/5 distally. Sensation grossly intact in all 4. Speech dysarthric. But no focal CN findings. Follows most simple one step commands and answered biographical questions. Has fair insight and  awareness. Cooperates with the entirety of my exam.  Skin: Skin is warm and dry.  Psychiatric: He has a normal mood and affect.     Results for orders placed during the hospital encounter of 08/09/12 (from the past 48 hour(s))  GLUCOSE, CAPILLARY     Status: None   Collection Time    08/16/12 11:58 AM      Result Value Range   Glucose-Capillary 85  70 - 99 mg/dL  GLUCOSE, CAPILLARY     Status: Abnormal   Collection Time    08/16/12  5:06 PM      Result Value Range   Glucose-Capillary 123 (*) 70 - 99 mg/dL  BASIC METABOLIC PANEL     Status: Abnormal   Collection Time    08/17/12  6:05 AM      Result Value Range   Sodium 143  135 - 145 mEq/L   Potassium 4.1  3.5 - 5.1 mEq/L   Comment: DELTA CHECK NOTED   Chloride 103  96 - 112 mEq/L   CO2 29  19 - 32 mEq/L   Glucose, Bld 88  70 - 99 mg/dL   BUN 60 (*) 6 - 23 mg/dL   Creatinine, Ser 4.48 (*) 0.50 - 1.35 mg/dL   Calcium 9.2  8.4 - 10.5 mg/dL   GFR calc non Af Amer 11 (*) >90 mL/min   GFR calc Af Amer 13 (*) >90 mL/min   Comment:            The eGFR has been calculated     using the CKD EPI equation.     This calculation has not been     validated in all clinical     situations.     eGFR's persistently     <90 mL/min signify     possible Chronic Kidney Disease.  GLUCOSE, CAPILLARY     Status: Abnormal   Collection Time    08/17/12  7:46 AM      Result Value Range   Glucose-Capillary 69 (*) 70 - 99 mg/dL  GLUCOSE, CAPILLARY     Status: Abnormal   Collection Time    08/17/12 11:41 AM      Result Value Range   Glucose-Capillary 144 (*) 70 - 99 mg/dL  GLUCOSE, CAPILLARY     Status: Abnormal   Collection Time    08/17/12  5:37 PM      Result Value Range   Glucose-Capillary 133 (*) 70 - 99 mg/dL  BASIC METABOLIC PANEL     Status: Abnormal   Collection Time    08/18/12  5:35 AM      Result Value Range   Sodium 144  135 - 145 mEq/L   Potassium 4.2  3.5 - 5.1 mEq/L   Chloride 103  96 - 112 mEq/L   CO2 31  19 - 32  mEq/L   Glucose, Bld 79  70 - 99 mg/dL   BUN 56 (*) 6 - 23 mg/dL   Creatinine, Ser 4.31 (*) 0.50 - 1.35 mg/dL   Calcium 9.1  8.4 - 10.5 mg/dL   GFR calc non Af Amer 12 (*) >  90 mL/min   GFR calc Af Amer 13 (*) >90 mL/min   Comment:            The eGFR has been calculated     using the CKD EPI equation.     This calculation has not been     validated in all clinical     situations.     eGFR's persistently     <90 mL/min signify     possible Chronic Kidney Disease.  CLOSTRIDIUM DIFFICILE BY PCR     Status: None   Collection Time    08/18/12  6:36 AM      Result Value Range   C difficile by pcr NEGATIVE  NEGATIVE  GLUCOSE, CAPILLARY     Status: Abnormal   Collection Time    08/18/12  8:05 AM      Result Value Range   Glucose-Capillary 69 (*) 70 - 99 mg/dL   Comment 1 Documented in Chart     Comment 2 Notify RN    GLUCOSE, CAPILLARY     Status: Abnormal   Collection Time    08/18/12  8:30 AM      Result Value Range   Glucose-Capillary 68 (*) 70 - 99 mg/dL   Comment 1 Documented in Chart     Comment 2 Notify RN    GLUCOSE, CAPILLARY     Status: Abnormal   Collection Time    08/18/12  8:54 AM      Result Value Range   Glucose-Capillary 102 (*) 70 - 99 mg/dL   Comment 1 Documented in Chart     Comment 2 Notify RN     No results found.  Post Admission Physician Evaluation: 1. Functional deficits secondary  to deconditioning after sepsis. Pt also with right frontal infarct of undetermined age.. 2. Patient is admitted to receive collaborative, interdisciplinary care between the physiatrist, rehab nursing staff, and therapy team. 3. Patient's level of medical complexity and substantial therapy needs in context of that medical necessity cannot be provided at a lesser intensity of care such as a SNF. 4. Patient has experienced substantial functional loss from his/her baseline which was documented above under the "Functional History" and "Functional Status" headings.  Judging by the  patient's diagnosis, physical exam, and functional history, the patient has potential for functional progress which will result in measurable gains while on inpatient rehab.  These gains will be of substantial and practical use upon discharge  in facilitating mobility and self-care at the household level. 5. Physiatrist will provide 24 hour management of medical needs as well as oversight of the therapy plan/treatment and provide guidance as appropriate regarding the interaction of the two. 6. 24 hour rehab nursing will assist with bladder management, bowel management, safety, skin/wound care, disease management, medication administration, pain management and patient education  and help integrate therapy concepts, techniques,education, etc. 7. PT will assess and treat for/with: Lower extremity strength, range of motion, stamina, balance, functional mobility, safety, adaptive techniques and equipment, NMR, education.   Goals are: supervision to minimal assist. 8. OT will assess and treat for/with: ADL's, functional mobility, safety, upper extremity strength, adaptive techniques and equipment, NMR, education.   Goals are: supervision to minimal assist. 9. SLP will assess and treat for/with: cognition, communication.  Goals are: supervision to mod I.. 10. Case Management and Social Worker will assess and treat for psychological issues and discharge planning. 11. Team conference will be held weekly to assess progress toward goals and to determine   barriers to discharge. 12. Patient will receive at least 3 hours of therapy per day at least 5 days per week. 13. ELOS: 3 weeks       14. Prognosis:  excellent   Medical Problem List and Plan: 1. DVT Prophylaxis/Anticoagulation: Pharmaceutical: Lovenox 2. Pain Management:  N/A 3. Mood:  Pleasant and appropriate today. Will have LCSW follow for evaluation.  4. Neuropsych: This patient is capable of making decisions on his own behalf. 5. CKD: Stage IV. Does not  want dialysis. Will need to monitor for fluid overload and/or signs of uremia. Off Lasix due to acute on chronic renal failure. Resume calcitriol.  6. CHF: Will check daily weights. Add low salt restrictions. Monitor for signs of overload--likely exacerbated by CKD. Resume lasix if indicated.  7. Diarrhea: Probiotic added. Will add a dose of questran tonight to see if this helps.  8. COPD:  Will continue spirivia. Will order rescue inhaler for use prn. Continue oxygen at nights.  9. DM type 2 diet controlled:  Will monitor with ac/hs cbg checks. Has been hypoglycemic in am. Add HS snack. 10 Thrombocytopenia: Will recheck in am. Change Sq heparin to lovenox.   11. H/o prostate cancer: Reports worsening of frequency/urgency.  Off Hytrin currently. Will check PVRs. Toilet while awake.  12. Psoriasis: Resume lidex to psoriatic lesions. Will add Eucerin cream additionally to dry areas.  Ladarrius Bogdanski T. Lazer Wollard, MD, FAAPMR [facility]  08/18/2012 

## 2012-08-18 NOTE — Plan of Care (Signed)
Overall Plan of Care [facility]) Patient Details Name: Macyn Remmert MRN: [id number] DOB: 1927-06-13  Diagnosis:  Deconditioning   Co-morbidities: HTN, COPD, CHF, CKD, CVA  Functional Problem List  Patient demonstrates impairments in the following areas: Balance, Cognition, Endurance, Motor, Safety and Vision  Basic ADL's: grooming, bathing, dressing and toileting Advanced ADL's: simple meal preparation  Transfers:  bed mobility, bed to chair, toilet, tub/shower, car and furniture Locomotion:  ambulation, wheelchair mobility and stairs  Additional Impairments:  Social Cognition   problem solving and memory  Anticipated Outcomes Item Anticipated Outcome  Eating/Swallowing    Basic self-care  Supervision   Tolieting  Modified indpendent  Bowel/Bladder  Manage bowel/bladder with timed toileting, max assist  Transfers  Supervision  Locomotion  Supervision gait and stairs  Communication    Cognition  Supervision for memory and Mod I for basic problem solving   Pain  </=3  Safety/Judgment    Other     Therapy Plan: PT Intensity: Minimum of 1-2 x/day ,45 to 90 minutes PT Frequency: 5 out of 7 days PT Duration Estimated Length of Stay: 10-12 days OT Intensity: Minimum of 1-2 x/day, 45 to 90 minutes OT Frequency: 5 out of 7 days OT Duration/Estimated Length of Stay: 10-12 days SLP Intensity: Minumum of 1-2 x/day, 30 to 90 minutes SLP Frequency: 5 out of 7 days SLP Duration/Estimated Length of Stay: 10-12 days    Team Interventions: Item RN PT OT SLP SW TR Other  Self Care/Advanced ADL Retraining  x x      Neuromuscular Re-Education  x x      Therapeutic Activities  x x x     UE/LE Strength Training/ROM  x x      UE/LE Coordination Activities  x x      Visual/Perceptual Remediation/Compensation   x      DME/Adaptive Equipment Instruction  x x      Therapeutic Exercise  x x      Balance/Vestibular Training  x x      Patient/Family Education  x x x     Cognitive  Remediation/Compensation  x x x     Functional Mobility Training  x x      Ambulation/Gait Training  x       Stair Training  x       Wheelchair Propulsion/Positioning  x       Functional Tourist information centre manager Reintegration  x x      Dysphagia/Aspiration Film/video editor         Bladder Management x        Bowel Management x        Disease Management/Prevention x x       Pain Management x x       Medication Management x        Skin Care/Wound Management x x       Splinting/Orthotics  x       Discharge Planning x x x x     Psychosocial Support x x x x                            Team Discharge Planning: Destination: PT-Home ,OT- Home , SLP-Home Projected Follow-up: PT-Home health PT;24 hour supervision/assistance, OT-  Home health OT, SLP-Home Health SLP;24 hour supervision/assistance Projected Equipment Needs: PT-None recommended by PT, OT- 3  in 1 bedside comode, SLP-None recommended by SLP Patient/family involved in discharge planning: PT- Patient,  OT-Patient, SLP-Patient  MD ELOS: 7-10 days Medical Rehab Prognosis:  Good Assessment: 79 year old male admitted for pneumonia which progressed to respiratory failure. Now is deconditioned. Now requiring 24/7 Rehab RN,MD, as well as CIR level PT, OT and SLP.  Treatment team will focus on ADLs and mobility with goals set at    See Team Conference Notes for weekly updates to the plan of care

## 2012-08-18 NOTE — PMR Pre-admission (Signed)
PMR Admission Coordinator Pre-Admission Assessment  Patient: Brad Jones is an 79 y.o., male MRN: [id number] DOB: 1927/10/04 Height: 5\' 8"  (172.7 cm) Weight: 82.6 kg (182 lb 1.6 oz)              Insurance Information HMO:      PPO:       PCP:       IPA:       80/20:  yes     OTHER:   PRIMARY: Medicare      Policy#: [id number]      Subscriber: pt       Employer: not employed Benefits:       Name: Armed forces technical officer. Date: 09-30-1994 A & B     Deduct: $1216.00      Out of Pocket Max: 0      Life Max: 0 CIR: 100%      SNF: days 1-20 100% days 21-100 $152.00/day Outpatient: 80%     Co-Pay: 20% Home Health: 100%      Co-Pay: 20% equipment used DME: [id number]     Co-Pay: 20% Providers: pt's choice     Emergency Contact Information Contact Information   Name Relation Home Work Mobile   Polhamus,Anita                         [place], Carolyne                Drake Leach Spouse                        Daughter                     Daughter [phone]                            [phone]      [phone]     Current Medical History  Patient Admitting Diagnosis:  deconditioning related to sepsis. 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 male with history of HTN, DM, COPD, CKD; admitted on 08/10/12 with h/o malaise, syncope leading to unresponsiveness in setting of severe CAP and acute on chronic renal failure. Patient intubated at [facility] and transferred to [facility] for treatment. He was treated with IV antibiotics for sepsis due to strep bacteremia. CT head with global atrophy with right frontal lobe infarct of indeterminate age and moderate SVD. CT chest with multifocal PNA with consolidation concerning for aspiration. 2D echo with EF 50-55% with grade 1 diastolic dysfunction and severe pulmonary HTN. Extubated on 07/17 and encephalopathy due to sepsis resolving. Fluid overload treated with diuresis. OT evaluation done today revealing ataxia, impaired balance and reports of  visual deficits. MD, OT recommending CIR   08/18/12 f/up blood cultures drawn-renal function slowly improving    Past Medical History  Past Medical History  Diagnosis Date  . Hypertension   . CHF (congestive heart failure)   . COPD (chronic obstructive pulmonary disease)   . Emphysema   . Diabetes mellitus without complication     non insulin dependent  . Cancer     prostate  . Renal disorder kidneys only function at 20%    pt does not want dialysis  . Stroke     TIA, multiple    Family History  family history is not on file.  Prior Rehab/Hospitalizations:  Some  [facility] PT [facility]   Current Medications  Current facility-administered medications:albuterol (PROVENTIL HFA;VENTOLIN HFA) 108 (90 BASE) MCG/ACT inhaler 2 puff, 2 puff, Inhalation, Q6H PRN, Merwyn Katos, MD;  antiseptic oral rinse (BIOTENE) solution 15 mL, 15 mL, Mouth Rinse, BID, Lupita Leash, MD, 15 mL at 08/18/12 0959;  aspirin chewable tablet 81 mg, 81 mg, Per Tube, Daily, Latrelle Dodrill, MD, 81 mg at 08/18/12 0959 heparin injection 5,000 Units, 5,000 Units, Subcutaneous, Q8H, Lupita Leash, MD, 5,000 Units at 08/18/12 7829;  metoprolol (LOPRESSOR) tablet 50 mg, 50 mg, Oral, BID, Jayce G Cook, DO, 50 mg at 08/18/12 5621;  multivitamin with minerals tablet 1 tablet, 1 tablet, Per Tube, Daily, Heather Cornelison Pitts, RD, 1 tablet at 08/18/12 3086 saccharomyces boulardii (FLORASTOR) capsule 250 mg, 250 mg, Oral, BID, Maretta Bees, MD, 250 mg at 08/18/12 5784;  tiotropium [facility]) inhalation capsule 18 mcg, 18 mcg, Inhalation, Daily, Merwyn Katos, MD, 18 mcg at 08/18/12 6962  Patients Current Diet: Carb Control  Precautions / Restrictions Precautions Precautions: Fall Precaution Comments: O2 dependent 2 liters Restrictions Weight Bearing Restrictions: No   Prior Activity Level  Not driving anymore-d/t vision issues & tendency to fall asleep. Mostly  daughters provide txport.  Daughter does report wife still occ driving "as long as he is w/ her".   Home Assistive Devices / Equipment Home Assistive Devices/Equipment: None Home Equipment: Walker - 2 wheels;Cane - single point;Bedside commode  Prior Functional Level Prior Function Level of Independence: Needs assistance;Independent with assistive device(s) Gait / Transfers Assistance Needed: mod I Comments: wife assist as needed with LB ADL  Current Functional Level Cognition  Overall Cognitive Status: Within Functional Limits for tasks assessed Orientation Level: Oriented to person;Oriented to place;Oriented to situation    Extremity Assessment (includes Sensation/Coordination)  Upper Extremity Assessment: Defer to OT evaluation  Lower Extremity Assessment: LLE deficits/detail;RLE deficits/detail RLE Deficits / Details: AROM grossly WFL except ankle dorsiflexion limited by pain, stiffness, edema to approx -20 degrees, strength grossly 4/5, c/o sensory changes anterior lower leg ? psoriatic skin changes over both knees; decreased coordination with toe tapping as compared to left RLE Sensation: decreased light touch LLE Deficits / Details: AROM WFL, strength grossly 4/5    ADLs  Eating/Feeding: Supervision/safety;Set up Where Assessed - Eating/Feeding: Chair Grooming: Minimal assistance Where Assessed - Grooming: Supported sitting Upper Body Bathing: Minimal assistance Where Assessed - Upper Body Bathing: Supported sitting Lower Body Bathing: Maximal assistance Where Assessed - Lower Body Bathing: Supported sit to stand Upper Body Dressing: Moderate assistance Where Assessed - Upper Body Dressing: Supported sitting Lower Body Dressing: Maximal assistance Where Assessed - Lower Body Dressing: Supported sit to stand Toilet Transfer: Mining engineer: Patient Percentage: 30% Statistician Method: Sit to Barista:  (Bed>around  to chair>chair>around to recliner) Equipment Used: Gait belt;Wheelchair Transfers/Ambulation Related to ADLs: Min A for all with RW ADL Comments: functional decline    Mobility  Bed Mobility: Rolling Right;Rolling Left;Left Sidelying to Sit;Sitting - Scoot to Edge of Bed Rolling Right: 6: Modified independent (Device/Increase time);With rail Rolling Left: 6: Modified independent (Device/Increase time);With rail Left Sidelying to Sit: HOB flat;With rails;6: Modified independent (Device/Increase time) Supine to Sit: 4: Min assist;HOB elevated Sitting - Scoot to Edge of Bed: 7: Independent Scooting to HOB: 3: Mod assist    Transfers  Transfers: Sit to Stand;Stand to Sit Sit to Stand: 4: Min assist;With upper extremity assist;From bed Sit to  Stand: Patient Percentage: 30% Stand to Sit: 4: Min assist;With upper extremity assist;With armrests;To chair/3-in-1 Stand to Sit: Patient Percentage: 30%    Ambulation / Gait / Stairs / Wheelchair Mobility  Ambulation/Gait Ambulation/Gait Assistance: 4: Min guard Ambulation/Gait: Patient Percentage: 30% Ambulation Distance (Feet): 20 Feet Assistive device: Rolling walker Ambulation/Gait Assistance Details: Seated rest break after 10 feet Gait Pattern: Step-to pattern;Decreased stride length;Shuffle;Scissoring;Ataxic;Narrow base of support;Lateral trunk lean to right Stairs: No Wheelchair Mobility Wheelchair Mobility: No    Posture / Balance Static Sitting Balance Static Sitting - Balance Support: Feet supported;No upper extremity supported Static Sitting - Level of Assistance: 4: Min Oncologist Standing - Balance Support: Bilateral upper extremity supported Static Standing - Level of Assistance: 2: Max assist    Special needs/care consideration BiPAP/CPAP: per daughter, he has a "mask at home to wear" pt says he puts on most nights, but does not wear all night. Oxygen: used at home at night-now has continuous per  [place] @ 2L Skin: dry patches-psoriasis Bowel mgmt: LBM 08/17/12 loose incontinent Bladder mgmt: incontinent-condom cath [reports some urine incont PTA] Diabetic mgmt: yes      Previous Home Environment Living Arrangements: Spouse/significant other Available Help at Discharge: Available 24 hours/day Type of Home: House Home Layout: One level Home Access: Stairs to enter Entrance Stairs-Rails: None Entrance Stairs-Number of Steps: 3 Bathroom Shower/Tub: Engineer, manufacturing systems: Handicapped height Bathroom Accessibility: No Home Care Services: No  Discharge Living Setting Plans for Discharge Living Setting: Patient's home Type of Home at Discharge: House Discharge Home Layout: One level Discharge Home Access: Stairs to enter Entrance Stairs-Rails: None Entrance Stairs-Number of Steps: 2-3 Does the patient have any problems obtaining your medications?: No (goes to [place] in [place]-dtr takes him)  Social/Family/Support Systems Patient Roles: Spouse;Parent Contact Information: [phone] Anticipated Caregiver: daughters to support wife Anticipated Caregiver's Contact Information: Carolyn-[phone]; [phone] Ability/Limitations of Caregiver: dtrs are intermittent Caregiver Availability: 24/7 (wife )-problem is wife w/ memory issues Discharge Plan Discussed with Primary Caregiver: Yes (w/ wife & daughters, Carolyne & Harriett Sine) Dtrs made aware wife & pt heading toward requiring more care than they have been needing. Is Caregiver In Agreement with Plan?: Yes Does Caregiver/Family have Issues with Lodging/Transportation while Pt is in Rehab?: No    Goals/Additional Needs Patient/Family Goal for Rehab: Mod I (they are aware that best expected is S) Expected length of stay: 2-3 wks Pt/Family Agrees to Admission and willing to participate: Yes Program Orientation Provided & Reviewed with Pt/Caregiver Including Roles  & Responsibilities: Yes   Decrease burden of Care  through IP rehab admission: Specialzed equipment needs, Decrease number of caregivers, Bowel and bladder program and Patient/family education   Possible need for SNF placement upon discharge: cannot rule out   Patient Condition: This patient's medical and functional status has changed since the consult dated: 08/15/12 in which the Rehabilitation Physician determined and documented that the patient's condition is appropriate for intensive rehabilitative care in an inpatient rehabilitation facility. See "History of Present Illness" (above) for medical update. Functional changes are:  Min A 20' RW. Patient's medical and functional status update has been discussed with the Rehabilitation physician and patient remains appropriate for inpatient rehabilitation. Will admit to inpatient rehab today.  Preadmission Screen Completed By:  Brock Ra, 08/18/2012 2:55 PM ______________________________________________________________________   Discussed status with Dr. Riley Kill on 08/18/12 at 2:54pm and received telephone approval for admission today.  Admission Coordinator:  Brock Ra, time 2:54pm/Date 08/18/12

## 2012-08-18 NOTE — H&P (Signed)
Physical Medicine and Rehabilitation Admission H&P    Chief Complaint  Patient presents with  . Deconditioning.   : HPI:  Brad Jones is a 79 y.o. male with history of HTN, DM, COPD, macular degeneration, CKD (does not want dialysis); admitted on 08/10/12 with h/o malaise, syncope leading to unresponsiveness in setting of severe CAP and acute on chronic renal failure. Patient intubated at [facility] and transferred to [facility] for treatment. He was treated with IV antibiotics for sepsis due to strep bacteremia. CT head with global atrophy with right frontal lobe infarct of indeterminate age and moderate SVD. CT chest with multifocal PNA with consolidation concerning for aspiration. 2D echo with EF 50-55% with grade 1 diastolic dysfunction and severe pulmonary HTN. Extubated on 07/17 and encephalopathy due to sepsis resolving. Fluid overload treated with diuresis. Patient reported worsening of vision as well as ataxia with activity. He developed diarrhea this weekend and rectal tube placed to help with symptoms.  C-diff negative. Therapy team recommended CIR due to deconditioned state and patient admitted today.    Review of Systems  HENT: Negative for hearing loss.   Eyes: Positive for blurred vision (due to macular degeneration--worse but getting better. ).  Respiratory: Positive for shortness of breath. Negative for cough.   Cardiovascular: Negative for chest pain and palpitations.  Gastrointestinal: Positive for heartburn and diarrhea.  Genitourinary: Positive for urgency and frequency.  Musculoskeletal: Positive for myalgias and joint pain (recent right ankle pain/swelling.).  Neurological: Positive for weakness. Negative for dizziness and headaches.  Psychiatric/Behavioral: The patient is not nervous/anxious and does not have insomnia.    Past Medical History  Diagnosis Date  . Hypertension   . CHF (congestive heart failure)   . COPD (chronic obstructive pulmonary disease)   . Emphysema   .  Diabetes mellitus without complication     non insulin dependent  . Cancer     prostate  . Renal disorder kidneys only function at 20%    pt does not want dialysis  . Stroke     TIA, multiple   Past Surgical History  Procedure Laterality Date  . Prostatectomy     History reviewed. No pertinent family history.  Social History: Married. Lives in [place]. Was in the [employer]. 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 He reports that he has quit smoking about 30 years ago. He does not have any smokeless tobacco history on file. He reports that he does not drink alcohol. His drug history is not on file.    Allergies: No Known Allergies  Medications Prior to Admission  Medication Sig Dispense Refill  . albuterol (PROVENTIL HFA;VENTOLIN HFA) 108 (90 BASE) MCG/ACT inhaler Inhale 2 puffs into the lungs every 6 (six) hours as needed for wheezing.      Marland Kitchen aspirin 81 MG tablet Take 81 mg by mouth daily.      . calcitRIOL (ROCALTROL) 0.25 MCG capsule Take 0.25 mcg by mouth daily.      Marland Kitchen docusate sodium (COLACE) 50 MG capsule Take by mouth at bedtime.      . fluocinonide ointment (LIDEX) 0.05 % Apply topically 2 (two) times daily.      . furosemide (LASIX) 40 MG tablet Take 40 mg by mouth daily.      Marland Kitchen HYDROcodone-acetaminophen (NORCO/VICODIN) 5-325 MG per tablet Take 1 tablet by mouth every 6 (six) hours as needed for pain.      . metoprolol (TOPROL-XL) 200 MG 24 hr tablet Take 200 mg by  mouth daily.      . nitroGLYCERIN (NITROSTAT) 0.3 MG SL tablet Place 0.3 mg under the tongue every 5 (five) minutes as needed for chest pain.      Marland Kitchen terazosin (HYTRIN) 2 MG capsule Take 2 mg by mouth at bedtime.      Marland Kitchen tiotropium (SPIRIVA) 18 MCG inhalation capsule Place 18 mcg into inhaler and inhale daily.        Home: Home Living Family/patient expects to be discharged to:: Private residence Living Arrangements: Spouse/significant other Available Help at Discharge: Available 24 hours/day Type of  Home: House Home Access: Stairs to enter [employer] of Steps: 3 Entrance Stairs-Rails: None Home Layout: One level Home Equipment: Environmental consultant - 2 wheels;Cane - single point;Bedside commode   Functional History: Prior Function Comments: wife assist as needed with LB ADL  Functional Status:  Mobility: Bed Mobility Bed Mobility: Rolling Right;Rolling Left;Scooting to HOB Rolling Right: 4: Min assist Rolling Left: 4: Min assist Supine to Sit: 4: Min assist;HOB elevated Sitting - Scoot to Edge of Bed: 4: Min assist;With rail Scooting to [facility]: 3: Mod assist Transfers Transfers: Not assessed Sit to Stand: 1: +2 Total assist;From bed;With upper extremity assist Sit to Stand: Patient Percentage: 30% Stand to Sit: 1: +2 Total assist;To chair/3-in-1 Stand to Sit: Patient Percentage: 30% Ambulation/Gait Ambulation/Gait Assistance: 1: +2 Total assist Ambulation/Gait: Patient Percentage: 30% Ambulation Distance (Feet): 2 Feet Assistive device: Other (Comment) (pushing wheelchair) Ambulation/Gait Assistance Details: assist for balance due to right and posterior lean, assist to keep from tipping wheelchair and to keep anterior weight shift on feet; demosntrate ataxic pattern with scissoring right foot in front of left and inability to maintain upright posture Gait Pattern: Step-to pattern;Decreased stride length;Shuffle;Scissoring;Ataxic;Narrow base of support;Lateral trunk lean to right    ADL: ADL Eating/Feeding: Supervision/safety;Set up Where Assessed - Eating/Feeding: Chair Grooming: Minimal assistance Where Assessed - Grooming: Supported sitting Upper Body Bathing: Minimal assistance Where Assessed - Upper Body Bathing: Supported sitting Lower Body Bathing: Maximal assistance Where Assessed - Lower Body Bathing: Supported sit to stand Upper Body Dressing: Moderate assistance Where Assessed - Upper Body Dressing: Supported sitting Lower Body Dressing: Maximal  assistance Where Assessed - Lower Body Dressing: Supported sit to stand Toilet Transfer: +2 Total assistance;Simulated Equipment Used: Gait belt;Wheelchair Transfers/Ambulation Related to ADLs: +2 total A ADL Comments: functional decline  Cognition: Cognition Overall Cognitive Status: No family/caregiver present to determine baseline cognitive functioning Orientation Level: Oriented to person;Oriented to place;Oriented to situation Cognition Arousal/Alertness: Awake/alert Behavior During Therapy: WFL for tasks assessed/performed Overall Cognitive Status: No family/caregiver present to determine baseline cognitive functioning  Physical Exam: Blood pressure 120/76, pulse 76, temperature 98.5 F (36.9 C), temperature source Oral, resp. rate 18, height 5\' 8"  (1.727 m), weight 82.6 kg (182 lb 1.6 oz), SpO2 98.00%.  Physical Exam  Nursing note and vitals reviewed.  Constitutional: He appears well-developed and well-nourished.  Elderly, frail appearing male  HENT:  Head: Normocephalic and atraumatic.  Eyes: Conjunctivae and EOM are normal. Pupils are equal, round, and reactive to light.  Neck: Normal range of motion. Neck supple.  Cardiovascular: Normal rate and regular rhythm. No wheezes or rales Pulmonary/Chest: Effort normal. No respiratory distress. He has wheezes.  Congested cough.  Abdominal: Soft. Bowel sounds are normal. He exhibits no distension. There is no tenderness.  Musculoskeletal: He exhibits no edema and no tenderness.  Bilateral knees with large psoriatic patches. No pain with ROM.  Neurological: He is alert.  Oriented to self and place, reason  he's here. Ataxia with finger to left greater than right. RUE 4/5. LUE is 3+/5. LLE is 2+ to 3/5 prox to 4- at ankle. RLE is 3/5 prox to 4/5 distally. Sensation grossly intact in all 4. Speech dysarthric. But no focal CN findings. Follows most simple one step commands and answered biographical questions. Has fair insight and  awareness. Cooperates with the entirety of my exam.  Skin: Skin is warm and dry.  Psychiatric: He has a normal mood and affect.     Results for orders placed during the hospital encounter of 08/09/12 (from the past 48 hour(s))  GLUCOSE, CAPILLARY     Status: None   Collection Time    08/16/12 11:58 AM      Result Value Range   Glucose-Capillary 85  70 - 99 mg/dL  GLUCOSE, CAPILLARY     Status: Abnormal   Collection Time    08/16/12  5:06 PM      Result Value Range   Glucose-Capillary 123 (*) 70 - 99 mg/dL  BASIC METABOLIC PANEL     Status: Abnormal   Collection Time    08/17/12  6:05 AM      Result Value Range   Sodium 143  135 - 145 mEq/L   Potassium 4.1  3.5 - 5.1 mEq/L   Comment: DELTA CHECK NOTED   Chloride 103  96 - 112 mEq/L   CO2 29  19 - 32 mEq/L   Glucose, Bld 88  70 - 99 mg/dL   BUN 60 (*) 6 - 23 mg/dL   Creatinine, Ser 1.91 (*) 0.50 - 1.35 mg/dL   Calcium 9.2  8.4 - 47.8 mg/dL   GFR calc non Af Amer 11 (*) >90 mL/min   GFR calc Af Amer 13 (*) >90 mL/min   Comment:            The eGFR has been calculated     using the CKD EPI equation.     This calculation has not been     validated in all clinical     situations.     eGFR's persistently     <90 mL/min signify     possible Chronic Kidney Disease.  GLUCOSE, CAPILLARY     Status: Abnormal   Collection Time    08/17/12  7:46 AM      Result Value Range   Glucose-Capillary 69 (*) 70 - 99 mg/dL  GLUCOSE, CAPILLARY     Status: Abnormal   Collection Time    08/17/12 11:41 AM      Result Value Range   Glucose-Capillary 144 (*) 70 - 99 mg/dL  GLUCOSE, CAPILLARY     Status: Abnormal   Collection Time    08/17/12  5:37 PM      Result Value Range   Glucose-Capillary 133 (*) 70 - 99 mg/dL  BASIC METABOLIC PANEL     Status: Abnormal   Collection Time    08/18/12  5:35 AM      Result Value Range   Sodium 144  135 - 145 mEq/L   Potassium 4.2  3.5 - 5.1 mEq/L   Chloride 103  96 - 112 mEq/L   CO2 31  19 - 32  mEq/L   Glucose, Bld 79  70 - 99 mg/dL   BUN 56 (*) 6 - 23 mg/dL   Creatinine, Ser 2.95 (*) 0.50 - 1.35 mg/dL   Calcium 9.1  8.4 - 62.1 mg/dL   GFR calc non Af Amer 12 (*) >  90 mL/min   GFR calc Af Amer 13 (*) >90 mL/min   Comment:            The eGFR has been calculated     using the CKD EPI equation.     This calculation has not been     validated in all clinical     situations.     eGFR's persistently     <90 mL/min signify     possible Chronic Kidney Disease.  CLOSTRIDIUM DIFFICILE BY PCR     Status: None   Collection Time    08/18/12  6:36 AM      Result Value Range   C difficile by pcr NEGATIVE  NEGATIVE  GLUCOSE, CAPILLARY     Status: Abnormal   Collection Time    08/18/12  8:05 AM      Result Value Range   Glucose-Capillary 69 (*) 70 - 99 mg/dL   Comment 1 Documented in Chart     Comment 2 Notify RN    GLUCOSE, CAPILLARY     Status: Abnormal   Collection Time    08/18/12  8:30 AM      Result Value Range   Glucose-Capillary 68 (*) 70 - 99 mg/dL   Comment 1 Documented in Chart     Comment 2 Notify RN    GLUCOSE, CAPILLARY     Status: Abnormal   Collection Time    08/18/12  8:54 AM      Result Value Range   Glucose-Capillary 102 (*) 70 - 99 mg/dL   Comment 1 Documented in Chart     Comment 2 Notify RN     No results found.  Post Admission Physician Evaluation: 1. Functional deficits secondary  to deconditioning after sepsis. Pt also with right frontal infarct of undetermined age.. 2. Patient is admitted to receive collaborative, interdisciplinary care between the physiatrist, rehab nursing staff, and therapy team. 3. Patient's level of medical complexity and substantial therapy needs in context of that medical necessity cannot be provided at a lesser intensity of care such as a SNF. 4. Patient has experienced substantial functional loss from his/her baseline which was documented above under the "Functional History" and "Functional Status" headings.  Judging by the  patient's diagnosis, physical exam, and functional history, the patient has potential for functional progress which will result in measurable gains while on inpatient rehab.  These gains will be of substantial and practical use upon discharge  in facilitating mobility and self-care at the household level. 5. Physiatrist will provide 24 hour management of medical needs as well as oversight of the therapy plan/treatment and provide guidance as appropriate regarding the interaction of the two. 6. 24 hour rehab nursing will assist with bladder management, bowel management, safety, skin/wound care, disease management, medication administration, pain management and patient education  and help integrate therapy concepts, techniques,education, etc. 7. PT will assess and treat for/with: Lower extremity strength, range of motion, stamina, balance, functional mobility, safety, adaptive techniques and equipment, NMR, education.   Goals are: supervision to minimal assist. 8. OT will assess and treat for/with: ADL's, functional mobility, safety, upper extremity strength, adaptive techniques and equipment, NMR, education.   Goals are: supervision to minimal assist. 9. SLP will assess and treat for/with: cognition, communication.  Goals are: supervision to mod I.. 10. Case Management and Social Worker will assess and treat for psychological issues and discharge planning. 11. Team conference will be held weekly to assess progress toward goals and to determine  barriers to discharge. 12. Patient will receive at least 3 hours of therapy per day at least 5 days per week. 13. ELOS: 3 weeks       14. Prognosis:  excellent   Medical Problem List and Plan: 1. DVT Prophylaxis/Anticoagulation: Pharmaceutical: Lovenox 2. Pain Management:  N/A 3. Mood:  Pleasant and appropriate today. Will have LCSW follow for evaluation.  4. Neuropsych: This patient is capable of making decisions on his own behalf. 5. CKD: Stage IV. Does not  want dialysis. Will need to monitor for fluid overload and/or signs of uremia. Off Lasix due to acute on chronic renal failure. Resume calcitriol.  6. CHF: Will check daily weights. Add low salt restrictions. Monitor for signs of overload--likely exacerbated by CKD. Resume lasix if indicated.  7. Diarrhea: Probiotic added. Will add a dose of questran tonight to see if this helps.  8. COPD:  Will continue spirivia. Will order rescue inhaler for use prn. Continue oxygen at nights.  9. DM type 2 diet controlled:  Will monitor with ac/hs cbg checks. Has been hypoglycemic in am. Add HS snack. 10 Thrombocytopenia: Will recheck in am. Change Sq heparin to lovenox.   11. H/o prostate cancer: Reports worsening of frequency/urgency.  Off Hytrin currently. Will check PVRs. Toilet while awake.  12. Psoriasis: Resume lidex to psoriatic lesions. Will add Eucerin cream additionally to dry areas.  Ranelle Oyster, MD, [facility] Physical Medicine & Rehabilitation  08/18/2012

## 2012-08-18 NOTE — Progress Notes (Signed)
Pt transfer to 4w11 report called to staff.

## 2012-08-18 NOTE — Progress Notes (Signed)
Occupational Therapy Treatment Patient Details Name: Brad Jones MRN: [id number] DOB: Feb 12, 1927 Today's Date: 08/18/2012 Time: 4098-1191 OT Time Calculation (min): 29 min  OT Assessment / Plan / Recommendation  OT comments  Pt making great progress since last week. Still incontinent of bowel.  Follow Up Recommendations  CIR       Equipment Recommendations  Tub/shower bench    Recommendations for Other Services Rehab consult  Frequency Min 2X/week   Progress towards OT Goals Progress towards OT goals: Progressing toward goals  Plan Discharge plan remains appropriate    Precautions / Restrictions Precautions Precautions: Fall Precaution Comments: O2 dependent 2 liters Restrictions Weight Bearing Restrictions: No       ADL  Toilet Transfer: Simulated;Minimal assistance Toilet Transfer Method: Sit to stand Toilet Transfer Equipment:  (Bed>around to chair>chair>around to recliner) Transfers/Ambulation Related to ADLs: Min A for all with RW      OT Goals(current goals can now be found in the care plan section)    Visit Information  Last OT Received On: 08/18/12 Assistance Needed: +1 PT/OT Co-Evaluation/Treatment: Yes History of Present Illness: 79 y/o male with HTN and COPD was brought in by EMS unresponsive in the setting of severe CAP requiring intubation. Found to be in AKF and transferred to [facility] from [facility] ED on 7/12.            Cognition  Cognition Arousal/Alertness: Awake/alert Behavior During Therapy: WFL for tasks assessed/performed Overall Cognitive Status: Within Functional Limits for tasks assessed    Mobility  Bed Mobility Bed Mobility: Rolling Right;Rolling Left;Left Sidelying to Sit;Sitting - Scoot to Edge of Bed Rolling Right: 6: Modified independent (Device/Increase time);With rail Rolling Left: 6: Modified independent (Device/Increase time);With rail Left Sidelying to Sit: HOB flat;With rails;6: Modified independent (Device/Increase time) Sitting -  Scoot to Edge of Bed: 7: Independent Transfers Transfers: Sit to Stand;Stand to Sit Sit to Stand: 4: Min assist;With upper extremity assist;From bed Stand to Sit: 4: Min assist;With upper extremity assist;With armrests;To chair/3-in-1 Details for Transfer Assistance: VCs for safe hand placement          End of Session OT - End of Session Equipment Utilized During Treatment: Gait belt;Rolling walker;Oxygen (on 2 liters) Activity Tolerance: Patient tolerated treatment well Patient left: in chair;with call bell/phone within reach       Evette Georges [phone] 08/18/2012, 12:30 PM

## 2012-08-18 NOTE — Progress Notes (Signed)
Physical Therapy Treatment Patient Details Name: Brad Jones MRN: [id number] DOB: Mar 10, 1927 Today's Date: 08/18/2012 Time: 1125-1200 PT Time Calculation (min): 35 min  PT Assessment / Plan / Recommendation  PT Comments   Pt progressing well towards physical therapy goals. Pt showed improvement in ability to perform bed mobility and transfers. Pt continues to be quickly fatigued with functional mobility and ambulation.  Follow Up Recommendations  CIR     Does the patient have the potential to tolerate intense rehabilitation     Barriers to Discharge        Equipment Recommendations  None recommended by PT    Recommendations for Other Services Rehab consult  Frequency Min 3X/week   Progress towards PT Goals Progress towards PT goals: Progressing toward goals  Plan Current plan remains appropriate    Precautions / Restrictions Precautions Precautions: Fall Precaution Comments: O2 dependent 2 liters Restrictions Weight Bearing Restrictions: No   Pertinent Vitals/Pain Pt reports no paint throughout session, however O2 saturation is at 90-91% at rest with 2L/min supplemental O2 donned.    Mobility  Bed Mobility Bed Mobility: Rolling Right;Rolling Left;Left Sidelying to Sit;Sitting - Scoot to Edge of Bed Rolling Right: 6: Modified independent (Device/Increase time);With rail Rolling Left: 6: Modified independent (Device/Increase time);With rail Left Sidelying to Sit: HOB flat;With rails;6: Modified independent (Device/Increase time) Supine to Sit: 4: Min assist;HOB elevated Sitting - Scoot to Edge of Bed: 7: Independent Details for Bed Mobility Assistance: VCs for hand placement using rails Transfers Transfers: Sit to Stand;Stand to Sit Sit to Stand: 4: Min assist;With upper extremity assist;From bed Stand to Sit: 4: Min assist;With upper extremity assist;With armrests;To chair/3-in-1 Details for Transfer Assistance: VCs for safe hand  placement Ambulation/Gait Ambulation/Gait Assistance: 4: Min guard Ambulation Distance (Feet): 20 Feet Assistive device: Rolling walker Ambulation/Gait Assistance Details: Seated rest break after 10 feet Stairs: No Wheelchair Mobility Wheelchair Mobility: No    Exercises General Exercises - Lower Extremity Long Arc Quad: 5 reps   PT Diagnosis:    PT Problem List:   PT Treatment Interventions:     PT Goals (current goals can now be found in the care plan section) Acute Rehab PT Goals Patient Stated Goal: to go home PT Goal Formulation: With patient Time For Goal Achievement: 08/29/12 Potential to Achieve Goals: Good  Visit Information  Last PT Received On: 08/18/12 Assistance Needed: +1 PT/OT Co-Evaluation/Treatment: Yes History of Present Illness: 79 y/o male with HTN and COPD was brought in by EMS unresponsive in the setting of severe CAP requiring intubation. Found to be in AKF and transferred to [facility] from [facility] ED on 7/12.      Subjective Data  Subjective: I need to be cleaned up Patient Stated Goal: to go home   Cognition  Cognition Arousal/Alertness: Awake/alert Behavior During Therapy: WFL for tasks assessed/performed Overall Cognitive Status: Within Functional Limits for tasks assessed    Balance     End of Session PT - End of Session Equipment Utilized During Treatment: Gait belt Activity Tolerance: Patient limited by fatigue Patient left: in chair;with call bell/phone within reach   GP     Ruthann Cancer 08/18/2012, 1:04 PM  Ruthann Cancer, PT Acute Rehabilitation Services

## 2012-08-18 NOTE — Progress Notes (Signed)
Day shift nurse reported that patient was having loose stools. On call MD notified by night RN. Orders to collect a C.diff PCR was given. Patient placed on Enteric precautions. Will continue to monitor. Brad Jones E

## 2012-08-18 NOTE — Interval H&P Note (Signed)
Brad Jones was admitted today to Inpatient Rehabilitation with the diagnosis of deconditioning after sepsis.  The patient's history has been reviewed, patient examined, and there is no change in status.  Patient continues to be appropriate for intensive inpatient rehabilitation.  I have reviewed the patient's chart and labs.  Questions were answered to the patient's satisfaction.  Brad Jones T 08/18/2012, 5:04 PM

## 2012-08-19 ENCOUNTER — Inpatient Hospital Stay (HOSPITAL_COMMUNITY): Payer: Medicare Other | Admitting: Occupational Therapy

## 2012-08-19 ENCOUNTER — Inpatient Hospital Stay (HOSPITAL_COMMUNITY): Payer: Medicare Other | Admitting: Speech Pathology

## 2012-08-19 ENCOUNTER — Inpatient Hospital Stay (HOSPITAL_COMMUNITY): Payer: Medicare Other | Admitting: *Deleted

## 2012-08-19 DIAGNOSIS — I639 Cerebral infarction, unspecified: Secondary | ICD-10-CM | POA: Diagnosis present

## 2012-08-19 DIAGNOSIS — R5381 Other malaise: Secondary | ICD-10-CM | POA: Diagnosis present

## 2012-08-19 DIAGNOSIS — A4189 Other specified sepsis: Secondary | ICD-10-CM

## 2012-08-19 DIAGNOSIS — Z8673 Personal history of transient ischemic attack (TIA), and cerebral infarction without residual deficits: Secondary | ICD-10-CM

## 2012-08-19 DIAGNOSIS — H3530 Unspecified macular degeneration: Secondary | ICD-10-CM | POA: Diagnosis present

## 2012-08-19 DIAGNOSIS — D696 Thrombocytopenia, unspecified: Secondary | ICD-10-CM | POA: Diagnosis present

## 2012-08-19 DIAGNOSIS — N184 Chronic kidney disease, stage 4 (severe): Secondary | ICD-10-CM | POA: Diagnosis present

## 2012-08-19 DIAGNOSIS — J449 Chronic obstructive pulmonary disease, unspecified: Secondary | ICD-10-CM | POA: Diagnosis present

## 2012-08-19 LAB — COMPREHENSIVE METABOLIC PANEL
AST: 24 U/L (ref 0–37)
Albumin: 2.2 g/dL — ABNORMAL LOW (ref 3.5–5.2)
Calcium: 9.4 mg/dL (ref 8.4–10.5)
Chloride: 104 mEq/L (ref 96–112)
Creatinine, Ser: 3.82 mg/dL — ABNORMAL HIGH (ref 0.50–1.35)
Total Protein: 6.5 g/dL (ref 6.0–8.3)

## 2012-08-19 LAB — CBC WITH DIFFERENTIAL/PLATELET
Basophils Absolute: 0.1 10*3/uL (ref 0.0–0.1)
Basophils Relative: 1 % (ref 0–1)
Eosinophils Absolute: 0.3 10*3/uL (ref 0.0–0.7)
Eosinophils Relative: 4 % (ref 0–5)
HCT: 38.1 % — ABNORMAL LOW (ref 39.0–52.0)
Hemoglobin: 11.5 g/dL — ABNORMAL LOW (ref 13.0–17.0)
Lymphocytes Relative: 13 % (ref 12–46)
Lymphs Abs: 1.1 10*3/uL (ref 0.7–4.0)
MCH: 24.7 pg — ABNORMAL LOW (ref 26.0–34.0)
MCHC: 30.2 g/dL (ref 30.0–36.0)
Monocytes Absolute: 0.6 10*3/uL (ref 0.1–1.0)
Neutro Abs: 6.1 10*3/uL (ref 1.7–7.7)
Neutrophils Relative %: 75 % (ref 43–77)
RBC: 4.65 MIL/uL (ref 4.22–5.81)

## 2012-08-19 LAB — GLUCOSE, CAPILLARY
Glucose-Capillary: 83 mg/dL (ref 70–99)
Glucose-Capillary: 157 mg/dL — ABNORMAL HIGH (ref 70–99)

## 2012-08-19 NOTE — Progress Notes (Deleted)
Patient information reviewed and entered into eRehab system by Tora Duck, RN, CRRN, PPS Coordinator.  Information including medical coding and functional independence measure will be reviewed and updated through discharge.   If these abnormal clinical findings persist, appropriate workup will be completed. The patient understands that follow up is required to elucidate the situation.

## 2012-08-19 NOTE — Evaluation (Signed)
Speech Language Pathology Assessment and Plan  Patient Details  Name: Brad Jones MRN: [id number] Date of Birth: 1928/01/02  SLP Diagnosis: Cognitive Impairments  Rehab Potential: Excellent ELOS: 10-12 days   Today's Date: 08/19/2012 Time: 1478-2956 Time Calculation (min): 55 min  Skilled Therapeutic Intervention: Administered BSE and cognitive-linguistic evaluation. Please see below for details.   Problem List:  Patient Active Problem List   Diagnosis Date Noted  . Physical deconditioning 08/19/2012  . Chronic kidney disease (CKD), stage IV (severe) 08/19/2012  . COPD (chronic obstructive pulmonary disease) 08/19/2012  . Thrombocytopenia, unspecified 08/19/2012  . Macular degeneration 08/19/2012  . Acute renal failure 08/10/2012  . Septic shock(785.52) 08/10/2012  . Acute respiratory failure with hypoxia 08/10/2012  . Community acquired pneumonia 08/10/2012   Past Medical History:  Past Medical History  Diagnosis Date  . Hypertension   . CHF (congestive heart failure)   . COPD (chronic obstructive pulmonary disease)     use 2 1/2 L oxygen at nights  . Emphysema   . Diabetes mellitus without complication     non insulin dependent  . Cancer     prostate  . Renal disorder kidneys only function at 20%    pt does not want dialysis  . Stroke     TIA, multiple   Past Surgical History:  Past Surgical History  Procedure Laterality Date  . Prostatectomy      Assessment / Plan / Recommendation Clinical Impression  Pt is an 79 y.o. male with history of HTN, DM, COPD, macular degeneration, CKD (does not want dialysis); admitted on 08/10/12 with h/o malaise, syncope leading to unresponsiveness in setting of severe CAP and acute on chronic renal failure. Patient intubated at [facility] and transferred to [facility] for treatment. He was treated with IV antibiotics for sepsis due to strep bacteremia. CT head with global atrophy with right frontal lobe infarct of indeterminate age and  moderate SVD. CT chest with multifocal PNA with consolidation concerning for aspiration. 2D echo with EF 50-55% with grade 1 diastolic dysfunction and severe pulmonary HTN. Extubated on 07/17 and encephalopathy due to sepsis resolving. Fluid overload treated with diuresis. Patient reported worsening of vision as well as ataxia with activity. Therapy team recommended CIR due to deconditioned state and patient admitted 08/18/12. Pt demonstrates mild cognitive impairments characterized by decreased recall of new information impacting pt's overall functional independence with functional tasks. Pt's overall funciontal problem solving appeared intact for tasks assessed, but will need to be evaluated further. Pt's swallowing function appeared [facility] and recommend to continue current diet of regular textures with thin liquids. Pt would benefit from skilled SLP intervention to maximize cognitive function and overall functional independence. Anticipate pt will require 24 hour supervision and f/u home health services.     SLP Assessment  Patient will need skilled Speech Lanaguage Pathology Services during CIR admission    Recommendations  Diet Recommendations: Regular;Thin liquid Liquid Administration via: Cup;Straw Medication Administration: Whole meds with liquid Supervision: Patient able to self feed Compensations: Slow rate;Small sips/bites Postural Changes and/or Swallow Maneuvers: Seated upright 90 degrees Oral Care Recommendations: Oral care BID Patient destination: Home Follow up Recommendations: Home Health SLP;24 hour supervision/assistance Equipment Recommended: None recommended by SLP    SLP Frequency 5 out of 7 days   SLP Treatment/Interventions Cognitive remediation/compensation;Cueing hierarchy;Functional tasks;Environmental controls;Internal/external aids;Patient/family education;Therapeutic Activities    Pain Pain Assessment Pain Assessment: No/denies pain  Short Term Goals: Week 1: SLP  Short Term Goal 1 (Week 1): Pt will utilize  memory compensatory strategies to recall new, daily information with supervision verbal and question cues.  SLP Short Term Goal 2 (Week 1): Pt will demonstrate functional problem solving for basic and familiar tasks with Mod I.   See FIM for current functional status Refer to Care Plan for Long Term Goals  Recommendations for other services: None  Discharge Criteria: Patient will be discharged from SLP if patient refuses treatment 3 consecutive times without medical reason, if treatment goals not met, if there is a change in medical status, if patient makes no progress towards goals or if patient is discharged from hospital.  The above assessment, treatment plan, treatment alternatives and goals were discussed and mutually agreed upon: by patient  Charlette Hennings 08/19/2012, 4:08 PM

## 2012-08-19 NOTE — Progress Notes (Signed)
Pt. Refuses CPAP at this time. Pt. Was made aware to let RT or RN know if he changed his mind & decided to wear CPAP anytime during the night.  

## 2012-08-19 NOTE — Evaluation (Signed)
Occupational Therapy Assessment and Plan  Patient Details  Name: Brad Jones MRN: [id number] Date of Birth: 1927/09/28  OT Diagnosis: muscle weakness (generalized) Rehab Potential: Rehab Potential: Excellent ELOS: 10-12 days   Today's Date: 08/19/2012 Time: 9629-5284 Time Calculation (min): 75 min  Problem List:  Patient Active Problem List   Diagnosis Date Noted  . Physical deconditioning 08/19/2012  . Chronic kidney disease (CKD), stage IV (severe) 08/19/2012  . COPD (chronic obstructive pulmonary disease) 08/19/2012  . Thrombocytopenia, unspecified 08/19/2012  . Macular degeneration 08/19/2012  . Acute renal failure 08/10/2012  . Septic shock(785.52) 08/10/2012  . Acute respiratory failure with hypoxia 08/10/2012  . Community acquired pneumonia 08/10/2012    Past Medical History:  Past Medical History  Diagnosis Date  . Hypertension   . CHF (congestive heart failure)   . COPD (chronic obstructive pulmonary disease)     use 2 1/2 L oxygen at nights  . Emphysema   . Diabetes mellitus without complication     non insulin dependent  . Cancer     prostate  . Renal disorder kidneys only function at 20%    pt does not want dialysis  . Stroke     TIA, multiple   Past Surgical History:  Past Surgical History  Procedure Laterality Date  . Prostatectomy      Assessment & Plan Clinical Impression: Patient is a 79 y.o. year old male with recent admission to the hospital on 08/10/12 with h/o malaise, syncope leading to unresponsiveness in setting of severe CAP and acute on chronic renal failure. Patient intubated at [facility] and transferred to [facility] for treatment. He was treated with IV antibiotics for sepsis due to strep bacteremia. CT head with global atrophy with right frontal lobe infarct of indeterminate age and moderate SVD. CT chest with multifocal PNA with consolidation concerning for aspiration. 2D echo with EF 50-55% with grade 1 diastolic dysfunction and severe pulmonary  HTN  with.  Patient transferred to CIR on 08/18/2012 .    Patient currently requires min with basic self-care skills secondary to muscle weakness and decreased cardiorespiratoy endurance and decreased oxygen support.  Prior to hospitalization, patient could complete ADLS with modified independent .  Patient will benefit from skilled intervention to decrease level of assist with basic self-care skills, increase independence with basic self-care skills and increase level of independence with iADL prior to discharge home with care partner.  Anticipate patient will require intermittent supervision and follow up home health.  OT - End of Session Activity Tolerance: Decreased this session Endurance Deficit: Yes Endurance Deficit Description: Oxygen sats decreased to 78% on room air during bathing and dressing tasks. OT Assessment Rehab Potential: Excellent Barriers to Discharge: Decreased caregiver support Barriers to Discharge Comments: unsure how much assist wife can provide OT Plan OT Intensity: Minimum of 1-2 x/day, 45 to 90 minutes OT Frequency: 5 out of 7 days OT Duration/Estimated Length of Stay: 10-12 days OT Treatment/Interventions: Balance/vestibular training;Discharge planning;DME/adaptive equipment instruction;Patient/family education;Neuromuscular re-education;Self Care/advanced ADL retraining;Therapeutic Exercise;UE/LE Strength taining/ROM;Therapeutic Activities;Functional mobility training OT Recommendation Patient destination: Home Follow Up Recommendations: Home health OT Equipment Recommended: 3 in 1 bedside comode   Skilled Therapeutic Intervention During therapy session worked on selfcare re-training at the sink sit to stand.  Pt able to stand for short intervals of time 2-3 mins during grooming tasks and LB selfcare.  O2 sats decreasing to 78% on room air with activity and increasing back to 92% with 2.5 Ls nasal cannula and rest.  Pt  with min assist for sit to stand and  standing balance.  OT Evaluation Precautions/Restrictions  Precautions Precautions: Fall Precaution Comments: O2 dependent 2 liters Restrictions Weight Bearing Restrictions: No  Vital Signs Oxygen Therapy SpO2: 93 % O2 Device: Nasal cannula O2 Flow Rate (L/min): 2.5 L/min Pain Pain Assessment Pain Assessment: No/denies pain Home Living/Prior Functioning Home Living Available Help at Discharge: Available 24 hours/day Type of Home: House Home Access: Stairs to enter Secretary/administrator of Steps: 3 Entrance Stairs-Rails: None Home Layout: One level  Lives With: Spouse Prior Function Level of Independence: Requires assistive device for independence  Able to Take Stairs?: Yes Driving: No Vocation: Retired  Optometrist - History Baseline Vision: Other (comment) (Pt wears glasses) Visual History: Macular degeneration Patient Visual Report: Blurring of vision Vision - Assessment Eye Alignment: Within Functional Limits Vision Assessment: Vision tested Tracking/Visual Pursuits: Able to track stimulus in all quads without difficulty Perception Perception: Within Functional Limits Praxis Praxis: Intact  Cognition Overall Cognitive Status: Within Functional Limits for tasks assessed Arousal/Alertness: Awake/alert Orientation Level: Oriented to person;Oriented to place;Oriented to time;Oriented to situation Attention: Sustained Sustained Attention: Appears intact Memory: Impaired Memory Impairment: Decreased short term memory Awareness: Appears intact Comments: Will continue to assess cognition and function with further treatment and selfcare tasks.   Sensation Sensation Light Touch: Appears Intact Stereognosis: Appears Intact Hot/Cold: Appears Intact Proprioception: Appears Intact Coordination Gross Motor Movements are Fluid and Coordinated: Yes Fine Motor Movements are Fluid and Coordinated: Yes Motor  Motor Motor: Abnormal postural alignment  and control Mobility  Transfers Sit to Stand: 4: Min assist;With upper extremity assist;From chair/3-in-1;With armrests Sit to Stand Details: Verbal cues for technique Stand to Sit: 4: Min assist;With upper extremity assist;With armrests;To chair/3-in-1 Stand to Sit Details (indicate cue type and reason): Verbal cues for technique  Trunk/Postural Assessment  Cervical Assessment Cervical Assessment: Exceptions to [facility] Cervical Strength Overall Cervical Strength Comments: Pt with cervical flexion in sitting and standing.  Pt unable to achieve neutral cervical extension. Lumbar Assessment Lumbar Assessment: Exceptions to [facility] Lumbar Strength Overall Lumbar Strength Comments: Pt with lumbar kyphosis  Balance Balance Balance Assessed: Yes Static Standing Balance Static Standing - Balance Support: Right upper extremity supported Static Standing - Level of Assistance: 4: Min assist Dynamic Standing Balance Dynamic Standing - Balance Support: Right upper extremity supported;Left upper extremity supported Dynamic Standing - Level of Assistance: 3: Mod assist Extremity/Trunk Assessment RUE Assessment RUE Assessment: Within Functional Limits (general weakness 4/5 throughout) LUE Assessment LUE Assessment: Within Functional Limits (general weakness 4/5 throughout)  FIM:  FIM - Eating Eating Activity: 5: Set-up assist for cut food;5: Set-up assist for open containers FIM - Grooming Grooming Steps: Wash, rinse, dry face;Wash, rinse, dry hands;Oral care, brush teeth, clean dentures;Brush, comb hair Grooming: 4: Patient completes 3 of 4 or 4 of 5 steps FIM - Bathing Bathing Steps Patient Completed: Chest;Right Arm;Left Arm;Abdomen;Front perineal area;Buttocks;Right upper leg;Left lower leg (including foot);Right lower leg (including foot);Left upper leg Bathing: 4: Steadying assist FIM - Upper Body Dressing/Undressing Upper body dressing/undressing steps patient completed: Thread/unthread  right sleeve of pullover shirt/dresss;Thread/unthread left sleeve of pullover shirt/dress;Put head through opening of pull over shirt/dress Upper body dressing/undressing: 4: Min-Patient completed 75 plus % of tasks FIM - Lower Body Dressing/Undressing Lower body dressing/undressing steps patient completed: Thread/unthread right pants leg;Thread/unthread left pants leg;Pull pants up/down;Don/Doff right sock;Fasten/unfasten left shoe;Fasten/unfasten right shoe;Don/Doff left shoe;Don/Doff left sock;Don/Doff right shoe Lower body dressing/undressing: 4: Min-Patient completed 75 plus % of tasks FIM -  Toileting Toileting steps completed by patient: Adjust clothing prior to toileting;Performs perineal hygiene;Adjust clothing after toileting Toileting: 4: Steadying assist FIM - Diplomatic Services operational officer Devices: Environmental consultant;Bedside commode Toilet Transfers: 4-To toilet/BSC: Min A (steadying Pt. > 75%);4-From toilet/BSC: Min A (steadying Pt. > 75%)   Refer to Care Plan for Long Term Goals  Recommendations for other services: None  Discharge Criteria: Patient will be discharged from OT if patient refuses treatment 3 consecutive times without medical reason, if treatment goals not met, if there is a change in medical status, if patient makes no progress towards goals or if patient is discharged from hospital.  The above assessment, treatment plan, treatment alternatives and goals were discussed and mutually agreed upon: by patient  Shuntia Exton OTR/L 08/19/2012, 1:47 PM

## 2012-08-19 NOTE — Progress Notes (Signed)
Patient ID: Brad Jones, male   DOB: 1927-07-30, 79 y.o.   MRN: [id number] Subjective/Complaints: 79 y.o. male with history of HTN, DM, COPD, macular degeneration, CKD (does not want dialysis); admitted on 08/10/12 with h/o malaise, syncope leading to unresponsiveness in setting of severe CAP and acute on chronic renal failure. Patient intubated at [facility] and transferred to [facility] for treatment. He was treated with IV antibiotics for sepsis due to strep bacteremia. CT head with global atrophy with right frontal lobe infarct of indeterminate age and moderate SVD. CT chest with multifocal PNA with consolidation concerning for aspiration. 2D echo with EF 50-55% with grade 1 diastolic dysfunction and severe pulmonary HTN. Extubated on 07/17 and encephalopathy due to sepsis resolving. Fluid overload treated with diuresis  Review of Systems  Constitutional: Positive for malaise/fatigue.  All other systems reviewed and are negative.     Objective: Vital Signs: Blood pressure 130/70, pulse 75, temperature 98.3 F (36.8 C), temperature source Oral, resp. rate 19, height 5\' 8"  (1.727 m), weight 83.1 kg (183 lb 3.2 oz), SpO2 93.00%. No results found. Results for orders placed during the hospital encounter of 08/18/12 (from the past 72 hour(s))  GLUCOSE, CAPILLARY     Status: None   Collection Time    08/18/12  5:10 PM      Result Value Range   Glucose-Capillary 76  70 - 99 mg/dL   Comment 1 Notify RN    GLUCOSE, CAPILLARY     Status: Abnormal   Collection Time    08/18/12  8:34 PM      Result Value Range   Glucose-Capillary 151 (*) 70 - 99 mg/dL  GLUCOSE, CAPILLARY     Status: None   Collection Time    08/19/12  7:14 AM      Result Value Range   Glucose-Capillary 83  70 - 99 mg/dL  CBC WITH DIFFERENTIAL     Status: Abnormal   Collection Time    08/19/12  7:15 AM      Result Value Range   WBC 8.2  4.0 - 10.5 K/uL   RBC 4.65  4.22 - 5.81 MIL/uL   Hemoglobin 11.5 (*) 13.0 - 17.0 g/dL   HCT 40.9  (*) 81.1 - 52.0 %   MCV 81.9  78.0 - 100.0 fL   MCH 24.7 (*) 26.0 - 34.0 pg   MCHC 30.2  30.0 - 36.0 g/dL   RDW 91.4 (*) 78.2 - 95.6 %   Platelets 167  150 - 400 K/uL   Comment: PLATELET COUNT CONFIRMED BY SMEAR   Neutrophils Relative % 75  43 - 77 %   Lymphocytes Relative 13  12 - 46 %   Monocytes Relative 7  3 - 12 %   Eosinophils Relative 4  0 - 5 %   Basophils Relative 1  0 - 1 %   Neutro Abs 6.1  1.7 - 7.7 K/uL   Lymphs Abs 1.1  0.7 - 4.0 K/uL   Monocytes Absolute 0.6  0.1 - 1.0 K/uL   Eosinophils Absolute 0.3  0.0 - 0.7 K/uL   Basophils Absolute 0.1  0.0 - 0.1 K/uL   RBC Morphology TARGET CELLS     Comment: SCHISTOCYTES PRESENT (2-5/hpf)  COMPREHENSIVE METABOLIC PANEL     Status: Abnormal   Collection Time    08/19/12  7:15 AM      Result Value Range   Sodium 145  135 - 145 mEq/L   Potassium 4.1  3.5 -  5.1 mEq/L   Chloride 104  96 - 112 mEq/L   CO2 34 (*) 19 - 32 mEq/L   Glucose, Bld 92  70 - 99 mg/dL   BUN 48 (*) 6 - 23 mg/dL   Creatinine, Ser 1.61 (*) 0.50 - 1.35 mg/dL   Calcium 9.4  8.4 - 09.6 mg/dL   Total Protein 6.5  6.0 - 8.3 g/dL   Albumin 2.2 (*) 3.5 - 5.2 g/dL   AST 24  0 - 37 U/L   ALT 16  0 - 53 U/L   Alkaline Phosphatase 57  39 - 117 U/L   Total Bilirubin 0.3  0.3 - 1.2 mg/dL   GFR calc non Af Amer 13 (*) >90 mL/min   GFR calc Af Amer 16 (*) >90 mL/min   Comment:            The eGFR has been calculated     using the CKD EPI equation.     This calculation has not been     validated in all clinical     situations.     eGFR's persistently     <90 mL/min signify     possible Chronic Kidney Disease.  GLUCOSE, CAPILLARY     Status: Abnormal   Collection Time    08/19/12 12:13 PM      Result Value Range   Glucose-Capillary 107 (*) 70 - 99 mg/dL      Constitutional: He appears well-developed and well-nourished.  Elderly, frail appearing male  HENT:  Head: Normocephalic and atraumatic.  Eyes: Conjunctivae and EOM are normal. Pupils are equal,  round, and reactive to light.  Neck: Normal range of motion. Neck supple.  Cardiovascular: Normal rate and regular rhythm. No wheezes or rales  Pulmonary/Chest: Effort normal. No respiratory distress. He has wheezes.  Congested cough.  Abdominal: Soft. Bowel sounds are normal. He exhibits no distension. There is no tenderness.  Musculoskeletal: He exhibits no edema and no tenderness.  Bilateral knees with large psoriatic patches. No pain with ROM.  Neurological: He is alert.  Oriented to self and place, reason he's here. Ataxia with finger to left greater than right. RUE 4/5. LUE is 3+/5. LLE is 2+ to 3/5 prox to 4- at ankle. RLE is 3/5 prox to 4/5 distally. Sensation grossly intact in all 4. Speech dysarthric. But no focal CN findings. Follows most simple one step commands and answered biographical questions. Has fair insight and awareness. Cooperates with the entirety of my exam.  Skin: Skin is warm and dry.     Assessment/Plan: 1. Functional deficits secondary to Deconditioning which require 3+ hours per day of interdisciplinary therapy in a comprehensive inpatient rehab setting. Physiatrist is providing close team supervision and 24 hour management of active medical problems listed below. Physiatrist and rehab team continue to assess barriers to discharge/monitor patient progress toward functional and medical goals. FIM:             FIM - Bed/Chair Transfer Bed/Chair Transfer: 5: Supine > Sit: Supervision (verbal cues/safety issues);4: Bed > Chair or W/C: Min A (steadying Pt. > 75%);4: Chair or W/C > Bed: Min A (steadying Pt. > 75%)  FIM - Locomotion: Wheelchair Distance: 100 Locomotion: Wheelchair: 2: Travels 50 - 149 ft with supervision, cueing or coaxing FIM - Locomotion: Ambulation Locomotion: Ambulation Assistive Devices: Other (comment) (HHA) Ambulation/Gait Assistance: 3: Mod assist Locomotion: Ambulation: 1: Travels less than 50 ft with moderate assistance (Pt: 50 -  74%)  Comprehension Comprehension Mode: Auditory  Comprehension: 5-Follows basic conversation/direction: With extra time/assistive device  Expression Expression Mode: Verbal Expression: 4-Expresses basic 75 - 89% of the time/requires cueing 10 - 24% of the time. Needs helper to occlude trach/needs to repeat words.  Social Interaction Social Interaction: 5-Interacts appropriately 90% of the time - Needs monitoring or encouragement for participation or interaction.  Problem Solving Problem Solving: 5-Solves basic problems: With no assist  Memory Memory: 4-Recognizes or recalls 75 - 89% of the time/requires cueing 10 - 24% of the time :   Medical Problem List and Plan:  1. DVT Prophylaxis/Anticoagulation: Pharmaceutical: Lovenox  2. Pain Management: N/A  3. Mood: Pleasant and appropriate today. Will have LCSW follow for evaluation.  4. Neuropsych: This patient is capable of making decisions on his own behalf.  5. CKD: Stage IV. Does not want dialysis. Will need to monitor for fluid overload and/or signs of uremia. Off Lasix due to acute on chronic renal failure. Resume calcitriol.  6. CHF: Will check daily weights. Add low salt restrictions. Monitor for signs of overload--likely exacerbated by CKD. Resume lasix if indicated.  7. Diarrhea: Probiotic added. Will add a dose of questran tonight to see if this helps.  8. COPD: Will continue spirivia. Will order rescue inhaler for use prn. Continue oxygen at nights.  9. DM type 2 diet controlled: Will monitor with ac/hs cbg checks. Has been hypoglycemic in am. Add HS snack.  10 Thrombocytopenia: Will recheck in am. Change Sq heparin to lovenox.  11. H/o prostate cancer: Reports worsening of frequency/urgency. Off Hytrin currently. Will check PVRs. Toilet while awake.  12. Psoriasis: Resume lidex to psoriatic lesions. Will add Eucerin cream additionally to dry areas.  LOS (Days) 1 A FACE TO FACE EVALUATION WAS PERFORMED  Gelsey Amyx  E 08/19/2012, 1:04 PM

## 2012-08-19 NOTE — Evaluation (Signed)
Physical Therapy Assessment and Plan  Patient Details  Name: Brad Jones MRN: [id number] Date of Birth: [birth year]  PT Diagnosis: Difficulty walking and Muscle weakness Rehab Potential: Good ELOS: 10-12 days   Today's Date: 08/19/2012 Time: 4098-1191 Time Calculation (min): 60 min  Problem List:  Patient Active Problem List   Diagnosis Date Noted  . Physical deconditioning 08/19/2012  . Chronic kidney disease (CKD), stage IV (severe) 08/19/2012  . COPD (chronic obstructive pulmonary disease) 08/19/2012  . Thrombocytopenia, unspecified 08/19/2012  . Macular degeneration 08/19/2012  . Acute renal failure 08/10/2012  . Septic shock(785.52) 08/10/2012  . Acute respiratory failure with hypoxia 08/10/2012  . Community acquired pneumonia 08/10/2012    Past Medical History:  Past Medical History  Diagnosis Date  . Hypertension   . CHF (congestive heart failure)   . COPD (chronic obstructive pulmonary disease)     use 2 1/2 L oxygen at nights  . Emphysema   . Diabetes mellitus without complication     non insulin dependent  . Cancer     prostate  . Renal disorder kidneys only function at 20%    pt does not want dialysis  . Stroke     TIA, multiple   Past Surgical History:  Past Surgical History  Procedure Laterality Date  . Prostatectomy      Assessment & Plan Clinical Impression: Brad Jones is a 79 y.o. male with history of HTN, DM, COPD, macular degeneration, CKD (does not want dialysis); admitted on 08/10/12 with h/o malaise, syncope leading to unresponsiveness in setting of severe CAP and acute on chronic renal failure. Patient intubated at [facility] and transferred to [facility] for treatment. He was treated with IV antibiotics for sepsis due to strep bacteremia. CT head with global atrophy with right frontal lobe infarct of indeterminate age and moderate SVD. CT chest with multifocal PNA with consolidation concerning for aspiration. 2D echo with EF 50-55% with grade 1 diastolic  dysfunction and severe pulmonary HTN. Extubated on 07/17 and encephalopathy due to sepsis resolving. Fluid overload treated with diuresis. Patient reported worsening of vision as well as ataxia with activity. He developed diarrhea this weekend and rectal tube placed to help with symptoms. C-diff negative. Therapy team recommended CIR due to deconditioned state and patient transferred to CIR on 08/18/2012 .   Patient currently requires mod with mobility secondary to muscle weakness, decreased cardiorespiratoy endurance and decreased oxygen support, decreased visual acuity and decreased standing balance and decreased balance strategies.  Prior to hospitalization, patient was modified independent  with mobility and lived with Spouse in a House home.  Home access is 3Stairs to enter.  Patient will benefit from skilled PT intervention to maximize safe functional mobility, minimize fall risk and decrease caregiver burden for planned discharge home with 24 hour supervision.  Anticipate patient will benefit from follow up HH at discharge.  PT - End of Session Activity Tolerance: Tolerates < 10 min activity with changes in vital signs Endurance Deficit: Yes Endurance Deficit Description: 02 sats dropped <90% with brief walk, rebounded when 02 increased to 3L PT Assessment Rehab Potential: Good Barriers to Discharge: Decreased caregiver support Barriers to Discharge Comments: Need to further assess sife's ability to supervise. Daughters work full time, but will help with intermittent as able PT Plan PT Intensity: Minimum of 1-2 x/day ,45 to 90 minutes PT Frequency: 5 out of 7 days PT Duration Estimated Length of Stay: 10-12 days PT Treatment/Interventions: Ambulation/gait training;Balance/vestibular training;Discharge planning;Disease management/prevention;DME/adaptive equipment instruction;Functional mobility training;Neuromuscular re-education;Patient/family  education;Pain management;Psychosocial  support;Skin care/wound management;Stair training;Therapeutic Activities;Therapeutic Exercise;UE/LE Strength taining/ROM;Wheelchair propulsion/positioning PT Recommendation Follow Up Recommendations: Home health PT;24 hour supervision/assistance Patient destination: Home Equipment Recommended: None recommended by PT Equipment Details: Pt has quad cane and RW  Skilled Therapeutic Intervention  Tx initiated this session with Berg Balance Assessment, with score = 24/56 with most difficulty on dynamic tasks as well as SLS tasks. Pt educated on findings and safety implications.    PT Evaluation Precautions/Restrictions Precautions Precautions: Fall Precaution Comments: O2 dependent 2 liters Restrictions Weight Bearing Restrictions: No General   Vital SignsTherapy Vitals Pulse Rate: 75 BP: 130/70 mmHg Patient Position, if appropriate: Sitting Oxygen Therapy SpO2: 93 % O2 Device: Nasal cannula O2 Flow Rate (L/min): 2.5 L/min Pain Pain Assessment Pain Assessment: No/denies pain Pain Score: 0-No pain Home Living/Prior Functioning Home Living Available Help at Discharge: Available 24 hours/day Type of Home: House Home Access: Stairs to enter Secretary/administrator of Steps: 3 Entrance Stairs-Rails: None Home Layout: One level  Lives With: Spouse Prior Function Level of Independence: Requires assistive device for independence  Able to Take Stairs?: Yes Driving: No Vocation: Retired Optometrist - History Baseline Vision: Other (comment) (Has glasses but not wear them) Visual History: Macular degeneration Patient Visual Report: Blurring of vision;Other (comment) Vision - Assessment Eye Alignment: Within Functional Limits  Cognition Overall Cognitive Status: Within Functional Limits for tasks assessed Arousal/Alertness: Awake/alert Orientation Level: Oriented to person;Oriented to place;Oriented to situation;Other (comment);Disoriented to  time Sensation Sensation Light Touch: Appears Intact Coordination Gross Motor Movements are Fluid and Coordinated: Yes Fine Motor Movements are Fluid and Coordinated: Yes Motor  Motor Motor: Motor impersistence Motor - Skilled Clinical Observations: Decreased sustained contraction, decreased muscular endurance  Mobility Bed Mobility Bed Mobility: Supine to Sit Rolling Right: 5: Supervision Transfers Stand Pivot Transfers: 4: Min assist Stand Pivot Transfer Details (indicate cue type and reason): Steadying assist with forward flexed posture and heavy UE reliance Locomotion  Ambulation Ambulation: Yes Ambulation/Gait Assistance: 3: Mod assist Ambulation Distance (Feet): 20 Feet Assistive device: 1 person hand held assist Ambulation/Gait Assistance Details: Forward flexed posture, shuflling gait, decreased stride length, decreased heel strike, and distance limited due to fatigue Stairs / Additional Locomotion Stairs: Yes Stairs Assistance: 4: Min assist Stairs Assistance Details (indicate cue type and reason): Steadying assist, especially with descent Stair Management Technique: Two rails;Alternating pattern;Step to pattern Number of Stairs: 7 Height of Stairs: 6 Wheelchair Mobility Wheelchair Mobility: Yes Wheelchair Assistance: 5: Investment banker, operational: Both upper extremities Wheelchair Parts Management: Needs assistance Distance: 100  Trunk/Postural Assessment  Cervical Assessment Cervical Assessment: Exceptions to [facility] (Forward head posture) Thoracic Assessment Thoracic Assessment: Within Functional Limits Lumbar Assessment Lumbar Assessment: Within Functional Limits Postural Control Postural Control: Within Functional Limits  Balance Balance Balance Assessed: Yes Standardized Balance Assessment Standardized Balance Assessment: Berg Balance Test Berg Balance Test Sit to Stand: Needs minimal aid to stand or to stabilize Standing Unsupported: Able to  stand 2 minutes with supervision Sitting with Back Unsupported but Feet Supported on Floor or Stool: Able to sit safely and securely 2 minutes Stand to Sit: Sits independently, has uncontrolled descent Transfers: Needs one person to assist Standing Unsupported with Eyes Closed: Able to stand 10 seconds with supervision Standing Ubsupported with Feet Together: Needs help to attain position but able to stand for 30 seconds with feet together From Standing, Reach Forward with Outstretched Arm: Can reach forward >12 cm safely (5") From Standing Position, Pick up Object from Floor: Able to pick  up shoe, needs supervision From Standing Position, Turn to Look Behind Over each Shoulder: Looks behind one side only/other side shows less weight shift Turn 360 Degrees: Needs assistance while turning Standing Unsupported, Alternately Place Feet on Step/Stool: Able to complete >2 steps/needs minimal assist Standing Unsupported, One Foot in Front: Loses balance while stepping or standing Standing on One Leg: Unable to try or needs assist to prevent fall Total Score: 24 Extremity Assessment      RLE Assessment RLE Assessment: Within Functional Limits (Decreased muscular endurance) LLE Assessment LLE Assessment: Within Functional Limits (Decreased muscular endurance)  FIM:  FIM - Locomotion: Wheelchair Distance: 100 FIM - Locomotion: Ambulation Ambulation/Gait Assistance: 3: Mod assist   Refer to Care Plan for Long Term Goals  Recommendations for other services: None  Discharge Criteria: Patient will be discharged from PT if patient refuses treatment 3 consecutive times without medical reason, if treatment goals not met, if there is a change in medical status, if patient makes no progress towards goals or if patient is discharged from hospital.  The above assessment, treatment plan, treatment alternatives and goals were discussed and mutually agreed upon: by patient  Clydene Laming, PT,  DPT  08/19/2012, 11:20 AM

## 2012-08-19 NOTE — Care Management Note (Signed)
Inpatient Rehabilitation Center Individual Statement of Services  Patient Name:  Brad Jones  Date:  08/19/2012  Welcome to the Inpatient Rehabilitation Center.  Our goal is to provide you with an individualized program based on your diagnosis and situation, designed to meet your specific needs.  With this comprehensive rehabilitation program, you will be expected to participate in at least 3 hours of rehabilitation therapies Monday-Friday, with modified therapy programming on the weekends.  Your rehabilitation program will include the following services:  Physical Therapy (PT), Occupational Therapy (OT), Speech Therapy (ST), 24 hour per day rehabilitation nursing, Case Management ( Social Worker), Rehabilitation Medicine, Nutrition Services and Pharmacy Services  Weekly team conferences will be held on Wednesday to discuss your progress.  Your Social Worker will talk with you frequently to get your input and to update you on team discussions.  Team conferences with you and your family in attendance may also be held.  Expected length of stay: 10-12 days Overall anticipated outcome: Supervision with cues  Depending on your progress and recovery, your program may change. Your Social Worker will coordinate services and will keep you informed of any changes. Your Child psychotherapist names and contact numbers are listed  below.  The following services may also be recommended but are not provided by the Inpatient Rehabilitation Center:   Driving Evaluations  Home Health Rehabiltiation Services  Outpatient Rehabilitatation Servives   Arrangements will be made to provide these services after discharge if needed.  Arrangements include referral to agencies that provide these services.  Your insurance has been verified to be:  Medicare Your primary doctor is:  [place] in [place]  Pertinent information will be shared with your doctor and your insurance company.  Social Worker:  Dossie Der, [place]  [phone]  Information discussed with and copy given to patient by: Lucy Chris, 08/19/2012, 1:34 PM

## 2012-08-19 NOTE — Progress Notes (Signed)
Patient information reviewed and entered into eRehab system by Tu Shimmel, RN, CRRN, PPS Coordinator.  Information including medical coding and functional independence measure will be reviewed and updated through discharge.     Per nursing patient was given "Data Collection Information Summary for Patients in Inpatient Rehabilitation Facilities with attached "Privacy Act Statement-Health Care Records" upon admission.  

## 2012-08-19 NOTE — Progress Notes (Signed)
Social Work Assessment and Plan Social Work Assessment and Plan  Patient Details  Name: Brad Jones MRN: [id number] Date of Birth: 1927/07/23  Today's Date: 08/19/2012  Problem List:  Patient Active Problem List   Diagnosis Date Noted  . Physical deconditioning 08/19/2012  . Chronic kidney disease (CKD), stage IV (severe) 08/19/2012  . COPD (chronic obstructive pulmonary disease) 08/19/2012  . Thrombocytopenia, unspecified 08/19/2012  . Macular degeneration 08/19/2012  . Acute renal failure 08/10/2012  . Septic shock(785.52) 08/10/2012  . Acute respiratory failure with hypoxia 08/10/2012  . Community acquired pneumonia 08/10/2012   Past Medical History:  Past Medical History  Diagnosis Date  . Hypertension   . CHF (congestive heart failure)   . COPD (chronic obstructive pulmonary disease)     use 2 1/2 L oxygen at nights  . Emphysema   . Diabetes mellitus without complication     non insulin dependent  . Cancer     prostate  . Renal disorder kidneys only function at 20%    pt does not want dialysis  . Stroke     TIA, multiple   Past Surgical History:  Past Surgical History  Procedure Laterality Date  . Prostatectomy     Social History:  reports that he quit smoking about 5 months ago. He has never used smokeless tobacco. He reports that he does not drink alcohol. His drug history is not on file.  Family / Support Systems Marital Status: Married Patient Roles: Spouse;Parent Spouse/Significant Other: Synetta Fail  [phone]-home Children: Rayetta Humphrey [phone]  Harriett Sine Dillard-daughter  [phone] Other Supports: Another local daughter Anticipated Caregiver: Wife with daughter's Ability/Limitations of Caregiver: Wife has memory issues and is elderly herself.  Daughter's all work daytime Caregiver Availability: 24/7 Family Dynamics: Close knit family who are invovled and supportive fo parents.  They are trying to work on having someone with them to assist, since Mom  has limitations of her own.    Social History Preferred language: English Religion:  Cultural Background: No issues Education: High School Read: Yes Write: Yes Employment Status: Retired Fish farm manager Issues: No issues Guardian/Conservator: None-according to MD pt is capable of making his own decisions while here   Abuse/Neglect Physical Abuse: Denies Verbal Abuse: Denies Sexual Abuse: Denies Exploitation of patient/patient's resources: Denies Self-Neglect: Denies  Emotional Status Pt's affect, behavior adn adjustment status: Pt is motivated he is deconditioned but willing to try to improve his condition.  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  Daughter reports: ' He always does his best, no matter who he is feeling." Recent Psychosocial Issues: Multiple medical issues Pyschiatric History: No history Substance Abuse History: No issues  Patient / Family Perceptions, Expectations & Goals Pt/Family understanding of illness & functional limitations: Pt and daughter have a good understanding of his condition.  He is willing to work in therapies and get as high level as he can.  All of their daughter's are involved in Dad's care and keep updated on his condition. Premorbid pt/family roles/activities: Husband, Father, grandfather, retiree, Mining engineer, Chief Financial Officer, veteran, etc Anticipated changes in roles/activities/participation: resume Pt/family expectations/goals: Pt states: " I want to get better, walking."  Daughter states: " I am hopeful he will do well here, saw in therapy today and he was doing good."  [employer]: Other (Comment) [facility] [place] Premorbid Home Care/DME Agencies: None Transportation available at discharge: Family Resource referrals recommended: Support group (specify) (CHF Support group)  Discharge Planning Living Arrangements: Spouse/significant other Support Systems: Spouse/significant  other;Children;Other  relatives;Friends/neighbors;Church/faith community Type of Residence: Private residence Insurance Resources: [employer] Financial Resources: Social Security;Other (Comment) Nature conservation officer pension) Financial Screen Referred: Yes Living Expenses: Lives with family Money Management: Patient;Spouse;Family Does the patient have any problems obtaining your medications?: No Home Management: Wife Patient/Family Preliminary Plans: Return home with wife and daughter's to assist-but they work during the day.  They check in daily and provide transportation to appointments.   Social Work Anticipated Follow Up Needs: HH/OP;Support Group  Clinical Impression Pleasant gentleman who is motivated and willing to work, but has multiple medical issues.  His wife has memory issues and may not be able to provide the cueing pt needs. Supportive daughter's who may need to do more than daily checks with their parents.  Await team's evaluations.  Lucy Chris 08/19/2012, 3:45 PM

## 2012-08-20 ENCOUNTER — Inpatient Hospital Stay (HOSPITAL_COMMUNITY): Payer: Medicare Other

## 2012-08-20 ENCOUNTER — Inpatient Hospital Stay (HOSPITAL_COMMUNITY): Payer: Medicare Other | Admitting: Occupational Therapy

## 2012-08-20 ENCOUNTER — Inpatient Hospital Stay (HOSPITAL_COMMUNITY): Payer: Medicare Other | Admitting: Speech Pathology

## 2012-08-20 LAB — GLUCOSE, CAPILLARY
Glucose-Capillary: 87 mg/dL (ref 70–99)
Glucose-Capillary: 122 mg/dL — ABNORMAL HIGH (ref 70–99)

## 2012-08-20 MED ORDER — CALCIUM POLYCARBOPHIL 625 MG PO TABS
1250.0000 mg | ORAL_TABLET | Freq: Every day | ORAL | Status: DC
  Administered 2012-08-20 – 2012-08-28 (×9): 1250 mg via ORAL
  Filled 2012-08-20 (×10): qty 2

## 2012-08-20 NOTE — Progress Notes (Signed)
Patient ID: Brad Jones, male   DOB: [birth year], 79 y.o.   MRN: [id number] Subjective/Complaints: 79 y.o. male with history of HTN, DM, COPD, macular degeneration, CKD (does not want dialysis); admitted on 08/10/12 with h/o malaise, syncope leading to unresponsiveness in setting of severe CAP and acute on chronic renal failure. Patient intubated at [facility] and transferred to [facility] for treatment. He was treated with IV antibiotics for sepsis due to strep bacteremia. CT head with global atrophy with right frontal lobe infarct of indeterminate age and moderate SVD. CT chest with multifocal PNA with consolidation concerning for aspiration. 2D echo with EF 50-55% with grade 1 diastolic dysfunction and severe pulmonary HTN. Extubated on 07/17 and encephalopathy due to sepsis resolving. Fluid overload treated with diuresis  Patient without difficulties overnight.  Review of Systems  Constitutional: Positive for malaise/fatigue.  All other systems reviewed and are negative.     Objective: Vital Signs: Blood pressure 121/58, pulse 75, temperature 98.3 F (36.8 C), temperature source Oral, resp. rate 18, height 5\' 8"  (1.727 m), weight 83.1 kg (183 lb 3.2 oz), SpO2 95.00%. No results found. Results for orders placed during the hospital encounter of 08/18/12 (from the past 72 hour(s))  GLUCOSE, CAPILLARY     Status: None   Collection Time    08/18/12  5:10 PM      Result Value Range   Glucose-Capillary 76  70 - 99 mg/dL   Comment 1 Notify RN    GLUCOSE, CAPILLARY     Status: Abnormal   Collection Time    08/18/12  8:34 PM      Result Value Range   Glucose-Capillary 151 (*) 70 - 99 mg/dL  GLUCOSE, CAPILLARY     Status: None   Collection Time    08/19/12  7:14 AM      Result Value Range   Glucose-Capillary 83  70 - 99 mg/dL  CBC WITH DIFFERENTIAL     Status: Abnormal   Collection Time    08/19/12  7:15 AM      Result Value Range   WBC 8.2  4.0 - 10.5 K/uL   RBC 4.65  4.22 - 5.81 MIL/uL   Hemoglobin 11.5 (*) 13.0 - 17.0 g/dL   HCT 27.2 (*) 53.6 - 64.4 %   MCV 81.9  78.0 - 100.0 fL   MCH 24.7 (*) 26.0 - 34.0 pg   MCHC 30.2  30.0 - 36.0 g/dL   RDW 03.4 (*) 74.2 - 59.5 %   Platelets 167  150 - 400 K/uL   Comment: PLATELET COUNT CONFIRMED BY SMEAR   Neutrophils Relative % 75  43 - 77 %   Lymphocytes Relative 13  12 - 46 %   Monocytes Relative 7  3 - 12 %   Eosinophils Relative 4  0 - 5 %   Basophils Relative 1  0 - 1 %   Neutro Abs 6.1  1.7 - 7.7 K/uL   Lymphs Abs 1.1  0.7 - 4.0 K/uL   Monocytes Absolute 0.6  0.1 - 1.0 K/uL   Eosinophils Absolute 0.3  0.0 - 0.7 K/uL   Basophils Absolute 0.1  0.0 - 0.1 K/uL   RBC Morphology TARGET CELLS     Comment: SCHISTOCYTES PRESENT (2-5/hpf)  COMPREHENSIVE METABOLIC PANEL     Status: Abnormal   Collection Time    08/19/12  7:15 AM      Result Value Range   Sodium 145  135 - 145 mEq/L   Potassium  4.1  3.5 - 5.1 mEq/L   Chloride 104  96 - 112 mEq/L   CO2 34 (*) 19 - 32 mEq/L   Glucose, Bld 92  70 - 99 mg/dL   BUN 48 (*) 6 - 23 mg/dL   Creatinine, Ser 4.78 (*) 0.50 - 1.35 mg/dL   Calcium 9.4  8.4 - 29.5 mg/dL   Total Protein 6.5  6.0 - 8.3 g/dL   Albumin 2.2 (*) 3.5 - 5.2 g/dL   AST 24  0 - 37 U/L   ALT 16  0 - 53 U/L   Alkaline Phosphatase 57  39 - 117 U/L   Total Bilirubin 0.3  0.3 - 1.2 mg/dL   GFR calc non Af Amer 13 (*) >90 mL/min   GFR calc Af Amer 16 (*) >90 mL/min   Comment:            The eGFR has been calculated     using the CKD EPI equation.     This calculation has not been     validated in all clinical     situations.     eGFR's persistently     <90 mL/min signify     possible Chronic Kidney Disease.  GLUCOSE, CAPILLARY     Status: Abnormal   Collection Time    08/19/12 12:13 PM      Result Value Range   Glucose-Capillary 107 (*) 70 - 99 mg/dL  GLUCOSE, CAPILLARY     Status: Abnormal   Collection Time    08/19/12  4:22 PM      Result Value Range   Glucose-Capillary 147 (*) 70 - 99 mg/dL  GLUCOSE,  CAPILLARY     Status: Abnormal   Collection Time    08/19/12  8:50 PM      Result Value Range   Glucose-Capillary 157 (*) 70 - 99 mg/dL   Comment 1 Notify RN    GLUCOSE, CAPILLARY     Status: None   Collection Time    08/20/12  7:26 AM      Result Value Range   Glucose-Capillary 87  70 - 99 mg/dL   Comment 1 Notify RN        Constitutional: He appears well-developed and well-nourished.  Elderly, frail appearing male  HENT:  Head: Normocephalic and atraumatic.  Eyes: Conjunctivae and EOM are normal. Pupils are equal, round, and reactive to light.  Neck: Normal range of motion. Neck supple.  Cardiovascular: Normal rate and regular rhythm. No wheezes or rales  Pulmonary/Chest: Effort normal. No respiratory distress. He has wheezes.  Congested cough.  Abdominal: Soft. Bowel sounds are normal. He exhibits no distension. There is no tenderness.  Musculoskeletal: He exhibits no edema and no tenderness.  Bilateral knees with large psoriatic patches. No pain with ROM.  Neurological: He is alert.  Oriented to self and place, reason he's here. Ataxia with finger to left greater than right. RUE 4/5. LUE is 3+/5. LLE is 2+ to 3/5 prox to 4- at ankle. RLE is 3/5 prox to 4/5 distally. Sensation grossly intact in all 4. Speech dysarthric. But no focal CN findings. Follows 2step commands and answered biographical questions. Has fair insight and awareness. Cooperates with the entirety of my exam.  Skin: Skin is warm and dry.     Assessment/Plan: 1. Functional deficits secondary to Deconditioning following pneumonia, bacteremia, respiratory failure which require 3+ hours per day of interdisciplinary therapy in a comprehensive inpatient rehab setting. Physiatrist is providing close team supervision  and 24 hour management of active medical problems listed below. Physiatrist and rehab team continue to assess barriers to discharge/monitor patient progress toward functional and medical goals. Team  conference today please see physician documentation under team conference tab, met with team face-to-face to discuss problems,progress, and goals. Formulized individual treatment plan based on medical history, underlying problem and comorbidities. FIM: FIM - Bathing Bathing Steps Patient Completed: Chest;Right Arm;Left Arm;Abdomen;Front perineal area;Buttocks;Right upper leg;Left lower leg (including foot);Right lower leg (including foot);Left upper leg Bathing: 4: Steadying assist  FIM - Upper Body Dressing/Undressing Upper body dressing/undressing steps patient completed: Thread/unthread right sleeve of pullover shirt/dresss;Thread/unthread left sleeve of pullover shirt/dress;Put head through opening of pull over shirt/dress Upper body dressing/undressing: 4: Min-Patient completed 75 plus % of tasks FIM - Lower Body Dressing/Undressing Lower body dressing/undressing steps patient completed: Thread/unthread right pants leg;Thread/unthread left pants leg;Pull pants up/down;Don/Doff right sock;Fasten/unfasten left shoe;Fasten/unfasten right shoe;Don/Doff left shoe;Don/Doff left sock;Don/Doff right shoe Lower body dressing/undressing: 4: Min-Patient completed 75 plus % of tasks  FIM - Toileting Toileting steps completed by patient: Adjust clothing prior to toileting;Performs perineal hygiene;Adjust clothing after toileting Toileting: 4: Steadying assist  FIM - Diplomatic Services operational officer Devices: Environmental consultant;Bedside commode Toilet Transfers: 4-To toilet/BSC: Min A (steadying Pt. > 75%);4-From toilet/BSC: Min A (steadying Pt. > 75%)  FIM - Bed/Chair Transfer Bed/Chair Transfer: 5: Supine > Sit: Supervision (verbal cues/safety issues);4: Bed > Chair or W/C: Min A (steadying Pt. > 75%);4: Chair or W/C > Bed: Min A (steadying Pt. > 75%)  FIM - Locomotion: Wheelchair Distance: 100 Locomotion: Wheelchair: 2: Travels 50 - 149 ft with supervision, cueing or coaxing FIM - Locomotion:  Ambulation Locomotion: Ambulation Assistive Devices: Other (comment) (HHA) Ambulation/Gait Assistance: 3: Mod assist Locomotion: Ambulation: 1: Travels less than 50 ft with moderate assistance (Pt: 50 - 74%)  Comprehension Comprehension Mode: Auditory Comprehension: 5-Follows basic conversation/direction: With extra time/assistive device  Expression Expression Mode: Verbal Expression: 4-Expresses basic 75 - 89% of the time/requires cueing 10 - 24% of the time. Needs helper to occlude trach/needs to repeat words.  Social Interaction Social Interaction: 5-Interacts appropriately 90% of the time - Needs monitoring or encouragement for participation or interaction.  Problem Solving Problem Solving: 5-Solves basic problems: With no assist  Memory Memory: 4-Recognizes or recalls 75 - 89% of the time/requires cueing 10 - 24% of the time :   Medical Problem List and Plan:  1. DVT Prophylaxis/Anticoagulation: Pharmaceutical: Lovenox  2. Pain Management: N/A  3. Mood: Pleasant and appropriate today. Will have LCSW follow for evaluation.  4. Neuropsych: This patient is capable of making decisions on his own behalf.  5. CKD: Stage IV. Does not want dialysis. Will need to monitor for fluid overload and/or signs of uremia. Off Lasix due to acute on chronic renal failure. Resume calcitriol.  6. CHF: Will check daily weights. Add low salt restrictions. Monitor for signs of overload--likely exacerbated by CKD. Resume lasix if indicated.  7. Diarrhea: Probiotic added. Will add a dose of questran tonight to see if this helps.  8. COPD: Will continue spirivia. Will order rescue inhaler for use prn. Continue oxygen at nights.  9. DM type 2 diet controlled: Will monitor with ac/hs cbg checks. Has been hypoglycemic in am. Add HS snack.  10 Thrombocytopenia: Will recheck in am. Change Sq heparin to lovenox.  11. H/o prostate cancer: Reports worsening of frequency/urgency. Off Hytrin currently. Will check  PVRs. Toilet while awake.  12. Psoriasis: Resume lidex to psoriatic lesions. Will  add Eucerin cream additionally to dry areas.  LOS (Days) 2 A FACE TO FACE EVALUATION WAS PERFORMED  Briggett Tuccillo E 08/20/2012, 10:03 AM

## 2012-08-20 NOTE — Progress Notes (Signed)
Speech Language Pathology Daily Session Note  Patient Details  Name: Brad Jones MRN: [id number] Date of Birth: Aug 15, 1927  Today's Date: 08/20/2012 Time: 1520-1605 Time Calculation (min): 45 min  Short Term Goals: Week 1: SLP Short Term Goal 1 (Week 1): Pt will utilize memory compensatory strategies to recall new, daily information with supervision verbal and question cues.  SLP Short Term Goal 2 (Week 1): Pt will demonstrate functional problem solving for basic and familiar tasks with Mod I.   Skilled Therapeutic Interventions: Skilled treatment session focused on addressing cognition.  Wife and daughter present for session that SLP facilitated with financial, time and medication management tasks.  Family often re-worded things for patient that increased his ability to complete task, but he required overall Min cues between SLP and family members.     FIM:  Comprehension Comprehension Mode: Auditory Comprehension: 5-Follows basic conversation/direction: With extra time/assistive device Expression Expression Mode: Verbal Expression: 5-Expresses basic 90% of the time/requires cueing < 10% of the time. Social Interaction Social Interaction: 5-Interacts appropriately 90% of the time - Needs monitoring or encouragement for participation or interaction. Problem Solving Problem Solving: 5-Solves basic problems: With no assist Memory Memory: 4-Recognizes or recalls 75 - 89% of the time/requires cueing 10 - 24% of the time  Pain Pain Assessment Pain Assessment: 0-10 Pain Score: 7  Pain Type: Chronic pain Pain Location: Back Pain Descriptors / Indicators: Aching Pain Frequency: Occasional Pain Intervention(s): RN made aware Multiple Pain Sites: No  Therapy/Group: Individual Therapy  Charlane Ferretti., CCC-SLP [phone]  Carleen Rhue 08/20/2012, 4:44 PM

## 2012-08-20 NOTE — Progress Notes (Signed)
Occupational Therapy Session Note  Patient Details  Name: Brad Jones MRN: [id number] Date of Birth: 1928-01-02  Today's Date: 08/20/2012 Time: 1000-1100 Time Calculation (min): 60 min  Short Term Goals: Week 1:  OT Short Term Goal 1 (Week 1): Pt will peform all bathing sit to stand with supervision. OT Short Term Goal 2 (Week 1): Pt will perform dressing sit to stand with supervision. OT Short Term Goal 3 (Week 1): Pt will peform shower transfers with supervision using RW.  OT Short Term Goal 4 (Week 1): Pt will perform toilet transfers with supervision using 3:1 and elevated toilet and RW.    Skilled Therapeutic Interventions/Progress Updates:    Pt seen for BADL retraining of B/D with a focus Jones activity tolerance and standing balance.  Pt performed extremely well with all skills today in that he only required supervision to occasional steadying assist.  During bathing, he was standing at the sink and he was incontinent of bowel and bladder Jones the floor.  Pt had a great deal of diarrhea.  Pt was able to stand with supervision at the sink with UE support for over 15 minutes during the clean up process.  He was not extremely fatigued and was able to complete dressing in an efficient manner.  Pt is very anxious to improve so he can go home soon. Pt resting in w/c at end of session with call light and phone in reach.  Therapy Documentation Precautions:  Precautions Precautions: Fall Precaution Comments: O2 dependent 2-3  liters Restrictions Weight Bearing Restrictions: No Vital Signs: Therapy Vitals Pulse Rate: 75 BP: 121/58 mmHg Oxygen Therapy SpO2: 95 % O2 Device: Nasal cannula O2 Flow Rate (L/min): 2.5 L/min Pain: Pain Assessment Pain Assessment: No/denies pain ADL:  See FIM for current functional status  Therapy/Group: Individual Therapy  Annalie Wenner 08/20/2012, 11:26 AM

## 2012-08-20 NOTE — Patient Care Conference (Signed)
Inpatient RehabilitationTeam Conference and Plan of Care Update Date: 08/20/2012   Time: 11:30 am    Patient Name: Brad Jones      Medical Record Number: [id number]  Date of Birth: 06-01-1927 Sex: Male         Room/Bed: 4W11C/4W11C-01 Payor Info: Payor: MEDICARE / Plan: MEDICARE PART A AND B / Product Type: *No Product type* /    Admitting Diagnosis: Sepsis  PNA  Admit Date/Time:  08/18/2012  5:02 PM Admission Comments: No comment available   Primary Diagnosis:  <principal problem not specified> Principal Problem: <principal problem not specified>  Patient Active Problem List   Diagnosis Date Noted  . Diarrhea 08/21/2012  . OSA (obstructive sleep apnea)--non compliant with CPAP 08/21/2012  . Physical deconditioning 08/19/2012  . Chronic kidney disease (CKD), stage IV (severe) 08/19/2012  . COPD (chronic obstructive pulmonary disease) 08/19/2012  . Thrombocytopenia, unspecified 08/19/2012  . Macular degeneration 08/19/2012  . Acute renal failure 08/10/2012  . Septic shock(785.52) 08/10/2012  . Acute respiratory failure with hypoxia 08/10/2012  . Community acquired pneumonia 08/10/2012    Expected Discharge Date: Expected Discharge Date: 08/30/12  Team Members Present: Physician leading conference: Dr. Claudette Laws Social Worker Present: Dossie Der, LCSW Nurse Present: Laural Roes, RN PT Present: Edman Circle, PT;Becky Henrene Dodge, PT;Bridgett Ripa, PT;Caroline Cook, PT;Emily Marya Amsler, PT OT Present: Rosalio Loud, OT;Kris Gellert, Carollee Sires, OT SLP Present: Fae Pippin, SLP     Current Status/Progress Goal Weekly Team Focus  Medical   respiratory status improved but still needs O2  Wean O2 prior to D/c  Monitor O2 sat in PT/OT   Bowel/Bladder   Patient is incontinent of bowel/bladder, LBM 7/22; patient wears condom catheter at [facility]  patient will be continent with timed toileting  continue to monitor and keep patient dry   Swallow/Nutrition/ Hydration     [facility]         ADL's   Pt is currently min assist level for toileting, toilet transfers and selfcare tasks. O2 sats decreasing to 78 on room air with activity and increasing to 92% with rest and 2Ls nasal cannula  Goals are overall supervision to modified independent for bathing and dressing, also modified independent for toileting and supervision for shower tub transfers.    Balance re-training, endurance building, self-care re-training    Mobility   Min A transfers, Mod A gait, Mod A balance  Mod I transfers, S gait and stairs with device  Dynamic balance, activity tolerance, gait and stairs   Communication   WFL  Goals are overall supervision to modified independent for bathing and dressing, also modified independent for toileting and supervision for shower tub transfers.    Balance re-training, endurance building, selfcare re-training   Safety/Cognition/ Behavioral Observations  Min A for memory and supervision for basic problem solving   Supervision for working memory and Mod I for basic problem solving  utilization of memory strategies, functional problem solving with money mangament, etc.    Pain   No c/o pain; patient denies when ask  assess pain q4hr and prn; remain pain of 0 on scale 0-10  monitor   Skin   Patient has psoriasis to bilateral knees, elbow, and back; dry skin to shins and feet-eucerin ointment applied; skin tear to left/right buttock from rectal tube-bari cream applied.  keep patient dry and clean; turn/reposition when in bed; healing of dry skin and psoriasis with treatment.  monitor       *See Care Plan and progress notes for long  and short-term goals.  Barriers to Discharge: Still requires physical assistance    Possible Resolutions to Barriers:  Continued therapy program    Discharge Planning/Teaching Needs:  Family coming up with a plan for both pt and wife.  Wife has memory issues.  Pursuing options      Team Discussion:  Balance and need for cues, some memory  issues.  Incont B&B-no awareness.  Fatigues easily need to build up endurance.    Revisions to Treatment Plan:  None   Continued Need for Acute Rehabilitation Level of Care: The patient requires daily medical management by a physician with specialized training in physical medicine and rehabilitation for the following conditions: Daily direction of a multidisciplinary physical rehabilitation program to ensure safe treatment while eliciting the highest outcome that is of practical value to the patient.: Yes Daily medical management of patient stability for increased activity during participation in an intensive rehabilitation regime.: Yes Daily analysis of laboratory values and/or radiology reports with any subsequent need for medication adjustment of medical intervention for : Pulmonary problems;Other  Lucy Chris 08/22/2012, 8:52 AM

## 2012-08-20 NOTE — Progress Notes (Signed)
Physical Therapy Session Note  Patient Details  Name: Brad Jones MRN: [id number] Date of Birth: 09-06-1927  Today's Date: 08/20/2012 Time: 1000-1100 and 1308-6578 Time Calculation (min): 60 min and 45 min  Short Term Goals: Week 1:  PT Short Term Goal 1 (Week 1): STG = LTG due to short LOS, Mod I transfers and S gait and stairs with device  Skilled Therapeutic Interventions/Progress Updates:  Treatment 1:  Therapeutic activities: bed mobility using bed rails; sitting EOB without back support while finishing breakfast, transfers.  Therapeutic exercise performed with bil Es to increase strength for functional mobility: seated in w/c preparatory for gait- Marching, alternating toe taps, bil knee extension, resisted hip adduction 10 x 2 each  Sit> stand for fastening brief- pt had been incontinent of bowel, and was unaware.    Returned to room, and bed for hygiene change.  Rolling L><R without use of railing during hygiene change.  Info. Passed to OT who is seeing pt next.  Treatment 2:   wife and daughter in attendance.  They plan to have rail placed at 2 STE from carport. Name bracelet was tight on pt's L wrist.  Therapist removed it and had secretary print a new one; applied.  Therapist left RN a note regarding bracelet plus pt's back pain.  Therapeutic exercise performed with bil LE to increase strength for functional mobility: seated marching, etc as above.  O2 on 2 L = 78 after several deep breaths; O2 increased to 3L.  Gait training with RW x 100', x 150' with min guard assist, min assist on turns due to narrow BOS.  Cues for safe hand placement sit>< stand.  In standing, pt rests in L knee flexion, leaning R.  Activity tolerance/strengthening ex in standing, using L hand on railing, 10 x 1 mini squats, attempted calf raises.  Pt unable to elevate either heel at all.  10 x 10 PF in sitting pressing toes into floor.  Issued hand out for Fall Prevention 1 exs. Fatigue limited  participation.    Therapy Documentation Precautions:  Precautions Precautions: Fall Precaution Comments: O2 dependent 2-3  liters Restrictions Weight Bearing Restrictions: No   Vital Signs: Therapy Vitals Pulse Rate: 75 Patient Position, if appropriate: Sitting Oxygen Therapy SpO2: 78 % O2 Device: Nasal cannula O2 Flow Rate (L/min): 2 L/min (portable tank; increased to 3L- O2 rose to 93%) Pain: Pain Assessment Pain Assessment: 0-10 Pain Score: 7  Pain Type: Chronic pain Pain Location: Back Pain Intervention(s): RN made aware   See FIM for current functional status  Therapy/Group: Individual Therapy  Brad Jones 08/20/2012, 3:10 PM

## 2012-08-20 NOTE — Progress Notes (Signed)
Pt. Refused cpap for tonight. RT informed pt. To notify if he changes his mind.  

## 2012-08-20 NOTE — Progress Notes (Signed)
Social Work Patient ID: Brad Jones, male   DOB: 09-27-1927, 79 y.o.   MRN: [id number] Met with pt, wife and daughter-Carolyn to inform team conference goals-supervision/mod/i level and discharge 8/2. Discussed pt';s bowel and bladder issues he reports he knows when he needs to go but no one comes in time and he can't get up on His own.  He feels at home he will be able to just go to the bathroom.  Discussed the options of hired assistance, NHP or family taking a leave form their job. Informed it would be good thave someone with them the first week to make sure it is going well with both wife and pt.  Wife reports: " We do not need anyone to Stay with [place], we are fine have been for 61 years."  Encouraged daughter to talk with other siblings and come up with a plan.  Aware of the CAP waiting list and the  Need for parents to be eligible for Medicaid.  Continue to work on a discharge plan.

## 2012-08-21 ENCOUNTER — Inpatient Hospital Stay (HOSPITAL_COMMUNITY): Payer: Medicare Other

## 2012-08-21 ENCOUNTER — Encounter (HOSPITAL_COMMUNITY): Payer: Medicare Other

## 2012-08-21 ENCOUNTER — Inpatient Hospital Stay (HOSPITAL_COMMUNITY): Payer: Medicare Other | Admitting: Speech Pathology

## 2012-08-21 ENCOUNTER — Inpatient Hospital Stay (HOSPITAL_COMMUNITY): Payer: Medicare Other | Admitting: Occupational Therapy

## 2012-08-21 DIAGNOSIS — R5381 Other malaise: Secondary | ICD-10-CM

## 2012-08-21 DIAGNOSIS — G4733 Obstructive sleep apnea (adult) (pediatric): Secondary | ICD-10-CM | POA: Diagnosis present

## 2012-08-21 DIAGNOSIS — R197 Diarrhea, unspecified: Secondary | ICD-10-CM | POA: Diagnosis present

## 2012-08-21 DIAGNOSIS — A4189 Other specified sepsis: Secondary | ICD-10-CM

## 2012-08-21 LAB — GLUCOSE, CAPILLARY
Glucose-Capillary: 106 mg/dL — ABNORMAL HIGH (ref 70–99)
Glucose-Capillary: 151 mg/dL — ABNORMAL HIGH (ref 70–99)
Glucose-Capillary: 152 mg/dL — ABNORMAL HIGH (ref 70–99)

## 2012-08-21 NOTE — Progress Notes (Signed)
Hypoglycemic Event  CBG: 69  Treatment: 15 GM carbohydrate snack  Symptoms: None  Follow-up CBG: Time:1800 CBG Result:151  Possible Reasons for Event: Unknown  Comments/MD notified: provide patient with 15g carb and patient ate supper meal.     Brad Jones  Remember to initiate Hypoglycemia Order Set & complete

## 2012-08-21 NOTE — Progress Notes (Signed)
Physical Therapy Session Note  Patient Details  Name: Ebubechukwu Jedlicka MRN: [id number] Date of Birth: 1927/11/24  Today's Date: 08/21/2012 Time:0900-1000 4098-1191 Time Calculation (min): 60 min and 30 min  Short Term Goals: Week 1:  PT Short Term Goal 1 (Week 1): STG = LTG due to short LOS, Mod I transfers and S gait and stairs with device  Skilled Therapeutic Interventions/Progress Updates:  Treatment 1:  Gait training on carpet and level tile, x 70', x 150'  Using RW, close supervision.    Therapeutic exercise performed with LE to increase activity tolerance and strength for functional mobility, in sitting.  O2 sats stayed above 90% on 2 L O2.  Toilet transfer, continent of B and B.  Treatment 2:  Seen in room.  Therapeutic exercise performed with LE to increase strength for functional mobility, and balance: Fall Prevention 1 exs with min assist for balance, no weights.  PROM and AROM bil hamstrings and heel cord.  Pt requested using toilet, continent of B and B. Gait in home setting with RW x 20' with close supervision, occasional cues for safe hand placement.     Therapy Documentation Precautions:  Precautions Precautions: Fall Precaution Comments: O2 dependent 2-3  liters Restrictions Weight Bearing Restrictions: No   Pain: Pain Assessment Pain Assessment: No/denies pain     See FIM for current functional status  Therapy/Group: Individual Therapy  Terence Bart 08/21/2012, 4:22 PM

## 2012-08-21 NOTE — Progress Notes (Signed)
Speech Language Pathology Daily Session Note  Patient Details  Name: Brad Jones MRN: [id number] Date of Birth: 07-15-1927  Today's Date: 08/21/2012 Time: 4098-1191 Time Calculation (min): 30 min  Short Term Goals: Week 1: SLP Short Term Goal 1 (Week 1): Pt will utilize memory compensatory strategies to recall new, daily information with supervision verbal and question cues.  SLP Short Term Goal 2 (Week 1): Pt will demonstrate functional problem solving for basic and familiar tasks with Mod I.   Skilled Therapeutic Interventions: Skilled treatment session focused on addressing cognition.  SLP facilitated with medication management task, which patient required Max faded to Mod assist cues to utilize mental flexibility with medication having different names but serving the same function.  Patient was able to recall and direct SLP back to his room with Supervision level verbal cues.     FIM:  Comprehension Comprehension Mode: Auditory Comprehension: 5-Follows basic conversation/direction: With extra time/assistive device Expression Expression Mode: Verbal Expression: 5-Expresses basic 90% of the time/requires cueing < 10% of the time. Social Interaction Social Interaction: 5-Interacts appropriately 90% of the time - Needs monitoring or encouragement for participation or interaction. Problem Solving Problem Solving: 5-Solves basic problems: With no assist Memory Memory: 4-Recognizes or recalls 75 - 89% of the time/requires cueing 10 - 24% of the time  Pain Pain Assessment Pain Assessment: No/denies pain  Therapy/Group: Individual Therapy  Charlane Ferretti., CCC-SLP [phone]  Brad Jones 08/21/2012, 4:20 PM

## 2012-08-21 NOTE — Progress Notes (Signed)
Patient ID: Destine Ambroise, male   DOB: 12/02/1927, 79 y.o.   MRN: [id number] Subjective/Complaints: 79 y.o. male with history of HTN, DM, COPD, macular degeneration, CKD (does not want dialysis); admitted on 08/10/12 with h/o malaise, syncope leading to unresponsiveness in setting of severe CAP and acute on chronic renal failure. Patient intubated at [facility] and transferred to [facility] for treatment. He was treated with IV antibiotics for sepsis due to strep bacteremia. CT head with global atrophy with right frontal lobe infarct of indeterminate age and moderate SVD. CT chest with multifocal PNA with consolidation concerning for aspiration. 2D echo with EF 50-55% with grade 1 diastolic dysfunction and severe pulmonary HTN. Extubated on 07/17 and encephalopathy due to sepsis resolving. Fluid overload treated with diuresis  Patient asking about team conference  Review of Systems  Constitutional: Positive for malaise/fatigue.  All other systems reviewed and are negative.     Objective: Vital Signs: Blood pressure 121/65, pulse 76, temperature 98.5 F (36.9 C), temperature source Oral, resp. rate 18, height 5\' 8"  (1.727 m), weight 83.1 kg (183 lb 3.2 oz), SpO2 97.00%. No results found. Results for orders placed during the hospital encounter of 08/18/12 (from the past 72 hour(s))  GLUCOSE, CAPILLARY     Status: None   Collection Time    08/18/12  5:10 PM      Result Value Range   Glucose-Capillary 76  70 - 99 mg/dL   Comment 1 Notify RN    GLUCOSE, CAPILLARY     Status: Abnormal   Collection Time    08/18/12  8:34 PM      Result Value Range   Glucose-Capillary 151 (*) 70 - 99 mg/dL  GLUCOSE, CAPILLARY     Status: None   Collection Time    08/19/12  7:14 AM      Result Value Range   Glucose-Capillary 83  70 - 99 mg/dL  CBC WITH DIFFERENTIAL     Status: Abnormal   Collection Time    08/19/12  7:15 AM      Result Value Range   WBC 8.2  4.0 - 10.5 K/uL   RBC 4.65  4.22 - 5.81 MIL/uL   Hemoglobin  11.5 (*) 13.0 - 17.0 g/dL   HCT 78.2 (*) 95.6 - 21.3 %   MCV 81.9  78.0 - 100.0 fL   MCH 24.7 (*) 26.0 - 34.0 pg   MCHC 30.2  30.0 - 36.0 g/dL   RDW 08.6 (*) 57.8 - 46.9 %   Platelets 167  150 - 400 K/uL   Comment: PLATELET COUNT CONFIRMED BY SMEAR   Neutrophils Relative % 75  43 - 77 %   Lymphocytes Relative 13  12 - 46 %   Monocytes Relative 7  3 - 12 %   Eosinophils Relative 4  0 - 5 %   Basophils Relative 1  0 - 1 %   Neutro Abs 6.1  1.7 - 7.7 K/uL   Lymphs Abs 1.1  0.7 - 4.0 K/uL   Monocytes Absolute 0.6  0.1 - 1.0 K/uL   Eosinophils Absolute 0.3  0.0 - 0.7 K/uL   Basophils Absolute 0.1  0.0 - 0.1 K/uL   RBC Morphology TARGET CELLS     Comment: SCHISTOCYTES PRESENT (2-5/hpf)  COMPREHENSIVE METABOLIC PANEL     Status: Abnormal   Collection Time    08/19/12  7:15 AM      Result Value Range   Sodium 145  135 - 145 mEq/L  Potassium 4.1  3.5 - 5.1 mEq/L   Chloride 104  96 - 112 mEq/L   CO2 34 (*) 19 - 32 mEq/L   Glucose, Bld 92  70 - 99 mg/dL   BUN 48 (*) 6 - 23 mg/dL   Creatinine, Ser 1.61 (*) 0.50 - 1.35 mg/dL   Calcium 9.4  8.4 - 09.6 mg/dL   Total Protein 6.5  6.0 - 8.3 g/dL   Albumin 2.2 (*) 3.5 - 5.2 g/dL   AST 24  0 - 37 U/L   ALT 16  0 - 53 U/L   Alkaline Phosphatase 57  39 - 117 U/L   Total Bilirubin 0.3  0.3 - 1.2 mg/dL   GFR calc non Af Amer 13 (*) >90 mL/min   GFR calc Af Amer 16 (*) >90 mL/min   Comment:            The eGFR has been calculated     using the CKD EPI equation.     This calculation has not been     validated in all clinical     situations.     eGFR's persistently     <90 mL/min signify     possible Chronic Kidney Disease.  GLUCOSE, CAPILLARY     Status: Abnormal   Collection Time    08/19/12 12:13 PM      Result Value Range   Glucose-Capillary 107 (*) 70 - 99 mg/dL  GLUCOSE, CAPILLARY     Status: Abnormal   Collection Time    08/19/12  4:22 PM      Result Value Range   Glucose-Capillary 147 (*) 70 - 99 mg/dL  GLUCOSE, CAPILLARY      Status: Abnormal   Collection Time    08/19/12  8:50 PM      Result Value Range   Glucose-Capillary 157 (*) 70 - 99 mg/dL   Comment 1 Notify RN    GLUCOSE, CAPILLARY     Status: None   Collection Time    08/20/12  7:26 AM      Result Value Range   Glucose-Capillary 87  70 - 99 mg/dL   Comment 1 Notify RN    GLUCOSE, CAPILLARY     Status: Abnormal   Collection Time    08/20/12 11:33 AM      Result Value Range   Glucose-Capillary 122 (*) 70 - 99 mg/dL   Comment 1 Notify RN    GLUCOSE, CAPILLARY     Status: None   Collection Time    08/20/12  4:37 PM      Result Value Range   Glucose-Capillary 90  70 - 99 mg/dL   Comment 1 Notify RN    GLUCOSE, CAPILLARY     Status: Abnormal   Collection Time    08/20/12  8:47 PM      Result Value Range   Glucose-Capillary 114 (*) 70 - 99 mg/dL   Comment 1 Notify RN    GLUCOSE, CAPILLARY     Status: None   Collection Time    08/21/12  7:25 AM      Result Value Range   Glucose-Capillary 90  70 - 99 mg/dL   Comment 1 Notify RN    GLUCOSE, CAPILLARY     Status: Abnormal   Collection Time    08/21/12 11:09 AM      Result Value Range   Glucose-Capillary 106 (*) 70 - 99 mg/dL   Comment 1 Notify RN  Constitutional: He appears well-developed and well-nourished.  Elderly, frail appearing male  HENT:  Head: Normocephalic and atraumatic.  Eyes: Conjunctivae and EOM are normal. Pupils are equal, round, and reactive to light.  Neck: Normal range of motion. Neck supple.  Cardiovascular: Normal rate and regular rhythm. No wheezes or rales  Pulmonary/Chest: Effort normal. No respiratory distress.  No cough.  Abdominal: Soft. Bowel sounds are normal. He exhibits no distension. There is no tenderness.  Musculoskeletal: He exhibits no edema and no tenderness.  Bilateral knees with large psoriatic patches. No pain with ROM.  Neurological: He is alert.  Oriented to self and place, reason he's here.  RUE 4/5. LUE is 3+/5. LLE is 2+ to 3/5 prox  to 4- at ankle. RLE is 3/5 prox to 4/5 distally. Sensation grossly intact in all 4. Speech dysarthric. But no focal CN findings. Follows 2step commands and answered biographical questions. Has fair insight and awareness. Cooperates with the entirety of my exam.  Skin: Skin is warm and dry.     Assessment/Plan: 1. Functional deficits secondary to Deconditioning following pneumonia, bacteremia, respiratory failure which require 3+ hours per day of interdisciplinary therapy in a comprehensive inpatient rehab setting. Physiatrist is providing close team supervision and 24 hour management of active medical problems listed below. Physiatrist and rehab team continue to assess barriers to discharge/monitor patient progress toward functional and medical goals.  FIM: FIM - Bathing Bathing Steps Patient Completed: Chest;Right Arm;Left Arm;Abdomen;Left upper leg;Right upper leg;Buttocks;Front perineal area;Right lower leg (including foot);Left lower leg (including foot) Bathing: 5: Supervision: Safety issues/verbal cues  FIM - Upper Body Dressing/Undressing Upper body dressing/undressing steps patient completed: Thread/unthread right sleeve of pullover shirt/dresss;Thread/unthread left sleeve of pullover shirt/dress;Put head through opening of pull over shirt/dress;Pull shirt over trunk Upper body dressing/undressing: 5: Set-up assist to: Obtain clothing/put away FIM - Lower Body Dressing/Undressing Lower body dressing/undressing steps patient completed: Thread/unthread left pants leg;Thread/unthread right pants leg;Pull pants up/down;Don/Doff right sock;Don/Doff left sock;Fasten/unfasten pants;Don/Doff right shoe;Don/Doff left shoe;Fasten/unfasten right shoe;Fasten/unfasten left shoe Lower body dressing/undressing: 5: Supervision: Safety issues/verbal cues  FIM - Toileting Toileting steps completed by patient: Adjust clothing prior to toileting;Performs perineal hygiene;Adjust clothing after  toileting Toileting: 4: Steadying assist  FIM - Diplomatic Services operational officer Devices: Environmental consultant;Bedside commode Toilet Transfers: 4-To toilet/BSC: Min A (steadying Pt. > 75%);4-From toilet/BSC: Min A (steadying Pt. > 75%)  FIM - Bed/Chair Transfer Bed/Chair Transfer: 5: Supine > Sit: Supervision (verbal cues/safety issues);5: Sit > Supine: Supervision (verbal cues/safety issues);4: Bed > Chair or W/C: Min A (steadying Pt. > 75%);4: Chair or W/C > Bed: Min A (steadying Pt. > 75%)  FIM - Locomotion: Wheelchair Distance: 100 Locomotion: Wheelchair: 1: Total Assistance/staff pushes wheelchair (Pt<25%) FIM - Locomotion: Ambulation Locomotion: Ambulation Assistive Devices: Other (comment) (HHA) Ambulation/Gait Assistance: 4: Min assist Locomotion: Ambulation: 4: Travels 150 ft or more with minimal assistance (Pt.>75%)  Comprehension Comprehension Mode: Auditory Comprehension: 5-Follows basic conversation/direction: With extra time/assistive device  Expression Expression Mode: Verbal Expression: 5-Expresses basic 90% of the time/requires cueing < 10% of the time.  Social Interaction Social Interaction: 5-Interacts appropriately 90% of the time - Needs monitoring or encouragement for participation or interaction.  Problem Solving Problem Solving: 5-Solves basic problems: With no assist  Memory Memory: 4-Recognizes or recalls 75 - 89% of the time/requires cueing 10 - 24% of the time :   Medical Problem List and Plan:  1. DVT Prophylaxis/Anticoagulation: Pharmaceutical: Lovenox  2. Pain Management: N/A  3. Mood: Pleasant and appropriate today.  Will have LCSW follow for evaluation.  4. Neuropsych: This patient is capable of making decisions on his own behalf.  5. CKD: Stage IV. Does not want dialysis. Will need to monitor for fluid overload and/or signs of uremia. Off Lasix due to acute on chronic renal failure. Resume calcitriol.  6. CHF: Will check daily weights. Add low  salt restrictions. Monitor for signs of overload--likely exacerbated by CKD. Resume lasix if indicated.  7. Diarrhea: Probiotic added. Will add a dose of questran tonight to see if this helps.  8. COPD: Will continue spirivia. Will order rescue inhaler for use prn. Continue oxygen at nights.  9. DM type 2 diet controlled: Will monitor with ac/hs cbg checks. Has been hypoglycemic in am. Add HS snack.  10 Thrombocytopenia: Will recheck in am. Change Sq heparin to lovenox.  11. H/o prostate cancer: Reports worsening of frequency/urgency. Off Hytrin currently. Will check PVRs. Toilet while awake.  12. Psoriasis: Resume lidex to psoriatic lesions. Will add Eucerin cream additionally to dry areas.  LOS (Days) 3 A FACE TO FACE EVALUATION WAS PERFORMED  KIRSTEINS,ANDREW E 08/21/2012, 2:18 PM

## 2012-08-21 NOTE — Progress Notes (Signed)
Occupational Therapy Session Note  Patient Details  Name: Brad Jones MRN: [id number] Date of Birth: 1927-04-03  Today's Date: 08/21/2012 Time: 1000-1100 Time Calculation (min): 60 min  Short Term Goals: Week 1:  OT Short Term Goal 1 (Week 1): Pt will peform all bathing sit to stand with supervision. OT Short Term Goal 2 (Week 1): Pt will perform dressing sit to stand with supervision. OT Short Term Goal 3 (Week 1): Pt will peform shower transfers with supervision using RW.  OT Short Term Goal 4 (Week 1): Pt will perform toilet transfers with supervision using 3:1 and elevated toilet and RW.    Skilled Therapeutic Interventions/Progress Updates:      Pt seen for BADL retraining of toileting, bathing, and dressing with a focus on activity tolerance and standing balance. Pt did well ambulating in and out of bathroom into shower with RW with supervision. He was able to complete his entire shower and all of his dressing with supervision (assist to don adult briefs).  He stood for 10 minutes in the shower with supervision demonstrating improved activity tolerance and balance. Pt did not need any extended rest breaks today and did not express that he was fatigued. Pt stated that he is anxious to return home.  Pt resting in chair at end of session with phone and call light in reach.      Therapy Documentation Precautions:  Precautions Precautions: Fall Precaution Comments: O2 dependent 2-3  liters Restrictions Weight Bearing Restrictions: No    Pain: Pain Assessment Pain Assessment: No/denies pain Pain Score: 0-No pain ADL:  See FIM for current functional status  Therapy/Group: Individual Therapy  Rondia Higginbotham 08/21/2012, 12:17 PM

## 2012-08-22 ENCOUNTER — Inpatient Hospital Stay (HOSPITAL_COMMUNITY): Payer: Medicare Other | Admitting: Occupational Therapy

## 2012-08-22 ENCOUNTER — Inpatient Hospital Stay (HOSPITAL_COMMUNITY): Payer: Medicare Other | Admitting: *Deleted

## 2012-08-22 ENCOUNTER — Inpatient Hospital Stay (HOSPITAL_COMMUNITY): Payer: Medicare Other

## 2012-08-22 ENCOUNTER — Inpatient Hospital Stay (HOSPITAL_COMMUNITY): Payer: Medicare Other | Admitting: Speech Pathology

## 2012-08-22 LAB — CBC
MCH: 25.7 pg — ABNORMAL LOW (ref 26.0–34.0)
MCHC: 31.4 g/dL (ref 30.0–36.0)
Platelets: 199 10*3/uL (ref 150–400)
RBC: 4.39 MIL/uL (ref 4.22–5.81)
RDW: 15.4 % (ref 11.5–15.5)

## 2012-08-22 LAB — GLUCOSE, CAPILLARY
Glucose-Capillary: 84 mg/dL (ref 70–99)
Glucose-Capillary: 110 mg/dL — ABNORMAL HIGH (ref 70–99)
Glucose-Capillary: 125 mg/dL — ABNORMAL HIGH (ref 70–99)

## 2012-08-22 LAB — BASIC METABOLIC PANEL
Calcium: 9.4 mg/dL (ref 8.4–10.5)
Creatinine, Ser: 3.29 mg/dL — ABNORMAL HIGH (ref 0.50–1.35)
GFR calc non Af Amer: 16 mL/min — ABNORMAL LOW (ref >90)
Glucose, Bld: 90 mg/dL (ref 70–99)
Sodium: 140 mEq/L (ref 135–145)

## 2012-08-22 NOTE — Progress Notes (Signed)
Patient ID: Brad Jones, male   DOB: 10/14/1927, 79 y.o.   MRN: [id number] Subjective/Complaints: 79 y.o. male with history of HTN, DM, COPD, macular degeneration, CKD (does not want dialysis); admitted on 08/10/12 with h/o malaise, syncope leading to unresponsiveness in setting of severe CAP and acute on chronic renal failure. Patient intubated at [facility] and transferred to [facility] for treatment. He was treated with IV antibiotics for sepsis due to strep bacteremia. CT head with global atrophy with right frontal lobe infarct of indeterminate age and moderate SVD. CT chest with multifocal PNA with consolidation concerning for aspiration. 2D echo with EF 50-55% with grade 1 diastolic dysfunction and severe pulmonary HTN. Extubated on 07/17 and encephalopathy due to sepsis resolving. Fluid overload treated with diuresis  No issues overnite Breathing ok,still on O2  Review of Systems  Constitutional: Positive for malaise/fatigue.  All other systems reviewed and are negative.     Objective: Vital Signs: Blood pressure 133/80, pulse 68, temperature 97.7 F (36.5 C), temperature source Oral, resp. rate 20, height 5\' 8"  (1.727 m), weight 83.1 kg (183 lb 3.2 oz), SpO2 98.00%. No results found. Results for orders placed during the hospital encounter of 08/18/12 (from the past 72 hour(s))  GLUCOSE, CAPILLARY     Status: Abnormal   Collection Time    08/19/12  8:50 PM      Result Value Range   Glucose-Capillary 157 (*) 70 - 99 mg/dL   Comment 1 Notify RN    GLUCOSE, CAPILLARY     Status: None   Collection Time    08/20/12  7:26 AM      Result Value Range   Glucose-Capillary 87  70 - 99 mg/dL   Comment 1 Notify RN    GLUCOSE, CAPILLARY     Status: Abnormal   Collection Time    08/20/12 11:33 AM      Result Value Range   Glucose-Capillary 122 (*) 70 - 99 mg/dL   Comment 1 Notify RN    GLUCOSE, CAPILLARY     Status: None   Collection Time    08/20/12  4:37 PM      Result Value Range    Glucose-Capillary 90  70 - 99 mg/dL   Comment 1 Notify RN    GLUCOSE, CAPILLARY     Status: Abnormal   Collection Time    08/20/12  8:47 PM      Result Value Range   Glucose-Capillary 114 (*) 70 - 99 mg/dL   Comment 1 Notify RN    GLUCOSE, CAPILLARY     Status: None   Collection Time    08/21/12  7:25 AM      Result Value Range   Glucose-Capillary 90  70 - 99 mg/dL   Comment 1 Notify RN    GLUCOSE, CAPILLARY     Status: Abnormal   Collection Time    08/21/12 11:09 AM      Result Value Range   Glucose-Capillary 106 (*) 70 - 99 mg/dL   Comment 1 Notify RN    GLUCOSE, CAPILLARY     Status: Abnormal   Collection Time    08/21/12  4:44 PM      Result Value Range   Glucose-Capillary 69 (*) 70 - 99 mg/dL   Comment 1 Notify RN    GLUCOSE, CAPILLARY     Status: Abnormal   Collection Time    08/21/12  5:57 PM      Result Value Range   Glucose-Capillary  151 (*) 70 - 99 mg/dL  GLUCOSE, CAPILLARY     Status: Abnormal   Collection Time    08/21/12  8:39 PM      Result Value Range   Glucose-Capillary 152 (*) 70 - 99 mg/dL   Comment 1 Documented in Chart     Comment 2 Notify RN    BASIC METABOLIC PANEL     Status: Abnormal   Collection Time    08/22/12  6:05 AM      Result Value Range   Sodium 140  135 - 145 mEq/L   Potassium 5.1  3.5 - 5.1 mEq/L   Chloride 101  96 - 112 mEq/L   CO2 30  19 - 32 mEq/L   Glucose, Bld 90  70 - 99 mg/dL   BUN 36 (*) 6 - 23 mg/dL   Creatinine, Ser 3.08 (*) 0.50 - 1.35 mg/dL   Calcium 9.4  8.4 - 65.7 mg/dL   GFR calc non Af Amer 16 (*) >90 mL/min   GFR calc Af Amer 19 (*) >90 mL/min   Comment:            The eGFR has been calculated     using the CKD EPI equation.     This calculation has not been     validated in all clinical     situations.     eGFR's persistently     <90 mL/min signify     possible Chronic Kidney Disease.  GLUCOSE, CAPILLARY     Status: None   Collection Time    08/22/12  7:15 AM      Result Value Range    Glucose-Capillary 84  70 - 99 mg/dL  GLUCOSE, CAPILLARY     Status: Abnormal   Collection Time    08/22/12 11:13 AM      Result Value Range   Glucose-Capillary 110 (*) 70 - 99 mg/dL   Comment 1 Notify RN    GLUCOSE, CAPILLARY     Status: None   Collection Time    08/22/12  5:00 PM      Result Value Range   Glucose-Capillary 70  70 - 99 mg/dL      Constitutional: He appears well-developed and well-nourished.  Elderly, frail appearing male  HENT:  Head: Normocephalic and atraumatic.  Eyes: Conjunctivae and EOM are normal. Pupils are equal, round, and reactive to light.  Neck: Normal range of motion. Neck supple.  Cardiovascular: Normal rate and regular rhythm. No wheezes or rales  Pulmonary/Chest: Effort normal. No respiratory distress.  No cough.  Abdominal: Soft. Bowel sounds are normal. He exhibits no distension. There is no tenderness.  Musculoskeletal: He exhibits no edema and no tenderness.  Bilateral knees with large psoriatic patches. No pain with ROM.  Neurological: He is alert.  Oriented to self and place, reason he's here.  RUE 4/5. LUE is 3+/5. LLE is 2+ to 3/5 prox to 4- at ankle. RLE is 3/5 prox to 4/5 distally. Sensation grossly intact in all 4. Speech dysarthric. But no focal CN findings. Follows 2step commands and answered biographical questions. Has fair insight and awareness. Cooperates with the entirety of my exam.  Skin: Skin is warm and dry.   Assessment/Plan: 1. Functional deficits secondary to Deconditioning following pneumonia, bacteremia, respiratory failure which require 3+ hours per day of interdisciplinary therapy in a comprehensive inpatient rehab setting. Physiatrist is providing close team supervision and 24 hour management of active medical problems listed below. Physiatrist and rehab  team continue to assess barriers to discharge/monitor patient progress toward functional and medical goals.  FIM: FIM - Bathing Bathing Steps Patient Completed:  Chest;Right Arm;Left Arm;Abdomen;Left upper leg;Right upper leg;Buttocks;Front perineal area;Right lower leg (including foot);Left lower leg (including foot) Bathing: 5: Supervision: Safety issues/verbal cues  FIM - Upper Body Dressing/Undressing Upper body dressing/undressing steps patient completed: Thread/unthread right sleeve of pullover shirt/dresss;Thread/unthread left sleeve of pullover shirt/dress;Put head through opening of pull over shirt/dress;Pull shirt over trunk Upper body dressing/undressing: 5: Set-up assist to: Obtain clothing/put away FIM - Lower Body Dressing/Undressing Lower body dressing/undressing steps patient completed: Thread/unthread left pants leg;Thread/unthread right pants leg;Pull pants up/down;Don/Doff right sock;Don/Doff left sock;Fasten/unfasten pants;Don/Doff right shoe;Don/Doff left shoe;Fasten/unfasten right shoe;Fasten/unfasten left shoe Lower body dressing/undressing: 5: Supervision: Safety issues/verbal cues  FIM - Toileting Toileting steps completed by patient: Adjust clothing prior to toileting;Performs perineal hygiene;Adjust clothing after toileting Toileting Assistive Devices: Grab bar or rail for support Toileting: 5: Supervision: Safety issues/verbal cues  FIM - Diplomatic Services operational officer Devices: Art gallery manager Transfers: 5-To toilet/BSC: Supervision (verbal cues/safety issues);5-From toilet/BSC: Supervision (verbal cues/safety issues)  FIM - Press photographer Assistive Devices: Arm rests;Walker Bed/Chair Transfer: 5: Bed > Chair or W/C: Supervision (verbal cues/safety issues);5: Chair or W/C > Bed: Supervision (verbal cues/safety issues)  FIM - Locomotion: Wheelchair Distance: 100 Locomotion: Wheelchair: 0: Activity did not occur FIM - Locomotion: Ambulation Locomotion: Ambulation Assistive Devices: Designer, industrial/product Ambulation/Gait Assistance: 5: Supervision Locomotion: Ambulation: 2: Travels 50 - 149 ft  with supervision/safety issues  Comprehension Comprehension Mode: Auditory Comprehension: 6-Follows complex conversation/direction: With extra time/assistive device  Expression Expression Mode: Verbal Expression: 5-Expresses complex 90% of the time/cues < 10% of the time  Social Interaction Social Interaction: 5-Interacts appropriately 90% of the time - Needs monitoring or encouragement for participation or interaction.  Problem Solving Problem Solving: 5-Solves complex 90% of the time/cues < 10% of the time  Memory Memory: 4-Recognizes or recalls 75 - 89% of the time/requires cueing 10 - 24% of the time :   Medical Problem List and Plan:  1. DVT Prophylaxis/Anticoagulation: Pharmaceutical: Lovenox  2. Pain Management: N/A  3. Mood: Pleasant and appropriate today. Will have LCSW follow for evaluation.  4. Neuropsych: This patient is capable of making decisions on his own behalf.  5. CKD: Stage IV. Does not want dialysis. Will need to monitor for fluid overload and/or signs of uremia. Off Lasix due to acute on chronic renal failure. Resume calcitriol.  6. CHF: Will check daily weights. Add low salt restrictions. Monitor for signs of overload--likely exacerbated by CKD. Resume lasix if indicated.  7. Diarrhea: Probiotic added. Will add a dose of questran tonight to see if this helps.  8. COPD: Will continue spirivia. Will order rescue inhaler for use prn. Continue oxygen at nights.  9. DM type 2 diet controlled: Will monitor with ac/hs cbg checks. Has been hypoglycemic in am. Add HS snack.  10 Thrombocytopenia: monitor lovenox.  11. H/o prostate cancer: Reports worsening of frequency/urgency. Off Hytrin currently. Will check PVRs. Toilet while awake.  12. Psoriasis: Resume lidex to psoriatic lesions. Will add Eucerin cream additionally to dry areas.  LOS (Days) 4 A FACE TO FACE EVALUATION WAS PERFORMED  KIRSTEINS,ANDREW E 08/22/2012, 5:43 PM

## 2012-08-22 NOTE — Progress Notes (Signed)
Speech Language Pathology Daily Session Note  Patient Details  Name: Brad Jones MRN: [id number] Date of Birth: Jul 12, 1927  Today's Date: 08/22/2012 Time: 4098-1191 Time Calculation (min): 40 min  Short Term Goals: Week 1: SLP Short Term Goal 1 (Week 1): Pt will utilize memory compensatory strategies to recall new, daily information with supervision verbal and question cues.  SLP Short Term Goal 2 (Week 1): Pt will demonstrate functional problem solving for basic and familiar tasks with Mod I.   Skilled Therapeutic Interventions: Skilled treatment session focused on addressing cognition goals.  SLP facilitated with medication management task of loading a pill box with written aid and demonstration x1, patient then able to perform task with Supervision level verbal cues.  Patient also solved moderately complex money management tasks with increased wait time.  Patient was able to recall and direct SLP back to his room with increased wait time.  Continue with current plan of care.    FIM:  Comprehension Comprehension Mode: Auditory Comprehension: 6-Follows complex conversation/direction: With extra time/assistive device Expression Expression Mode: Verbal Expression: 5-Expresses complex 90% of the time/cues < 10% of the time Social Interaction Social Interaction: 5-Interacts appropriately 90% of the time - Needs monitoring or encouragement for participation or interaction. Problem Solving Problem Solving: 5-Solves complex 90% of the time/cues < 10% of the time Memory Memory: 4-Recognizes or recalls 75 - 89% of the time/requires cueing 10 - 24% of the time FIM - Eating Eating Activity: 5: Set-up assist for cut food;5: Set-up assist for open containers  Pain Pain Assessment Pain Assessment: No/denies pain  Therapy/Group: Individual Therapy  Charlane Ferretti., CCC-SLP [phone]  Hawken Bielby 08/22/2012, 1:20 PM

## 2012-08-22 NOTE — Consult Note (Signed)
NEUROCOGNITIVE TESTING - [facility]   Brad Jones was referred for neuropsychological consultation given the possibility of cognitive sequelae and to assist in treatment planning. According to medical records, he was admitted to the rehab unit owing to deconditioning following pneumonia, bacteremia, and respiratory failure. Of note, there were indications from the staff that Brad Jones may have been suffering from memory issues prior to his hospitalization. And in fact, his wife may also be having some trouble with cognition.   Brad Jones reported suffering from progressively worsening cognitive difficulties over the past several years. No precipitating factors were endorsed. Memory dysfunction purportedly makes it hard for him to recall recent conversations and written material. He described himself as being generally forgetful. Diminished attention and concentration were also endorsed and word finding problems exist. Of note, he said that he did experience speech changes following "mini strokes" he sustained several years ago.   In general, Brad Jones said that he has been in good spirits and described his current mood as "pretty good." No issues with adjustment were endorsed. He believes that he is making strides in therapy and he has good social support that includes his wife.   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   PROCEDURES: [3 units of 16109 on 08/21/12]  Diagnostic Interview Medical record review Behavioral observations  The following tests were performed during today's visit: Repeatable Battery for the Assessment of Neuropsychological Status (RBANS, form A). Test results are as follows:   Of note, some tasks could not be administered due to his present medical situation.   RBANS Indices Scaled Score Percentile Description   Immediate Memory  53 <1 Markedly impaired  Visuospatial/Constructional N/A N/A N/A  Language 60 <1 Markedly impaired  Attention N/A N/A N/A  Delayed Memory N/A N/A N/A  Total Score N/A N/A N/A    RBANS Subtests Raw Score Percentile Description  List Learning 10 <1 Markedly impaired  Story Memory 6 1 Markedly impaired  Figure Copy N/A N/A N/A  Line Orientation 13 14 Below average  Picture Naming 8 14 Below average  Semantic Fluency 7 <1 Markedly impaired  Digit Span 9 45 Average  Coding N/A N/A N/A  List Recall 1 10 Below average  List Recognition 15 <1 Markedly impaired  Story Recall 3 5 Impaired   Figure recall N/A N/A N/A   Cognitive Evaluation: Test results revealed reduced (if not markedly impaired) functioning in all cognitive domains and thinking skills assessed with the exception of intact simple attention.   Emotional & Behavioral Evaluation: Brad Jones was appropriately dressed for season and situation, and he appeared tidy and well-groomed. Normal posture was noted. He was friendly and rapport easily established. His speech was as expected and he was able to express ideas effectively. He seemed to understand test directions readily. His affect was somewhat flat. Attention and motivation were good. Optimal test taking conditions were maintained.  From an emotional standpoint, Brad Jones denied experiencing any major signs of depression or anxiety. No adjustment issues were noted. Suicidal/homicidal ideation, plan or intent was denied. No manic or hypomanic episodes were reported. The patient denied ever experiencing any auditory/visual hallucinations. No major behavioral or personality changes were endorsed.    IMPRESSION: Overall, Brad Jones seems to be experiencing a significant degree of cognitive impairment, particularly involving memory. His performance is functionally consistent with a dementia diagnosis. Given the limitations of  this assessment, an exact diagnosis is  difficult to ascertain though Alzheimer's disease is high on the list. Cerebrovascular compromise as reportedly noted via neuroimaging may also be playing a role. However, further investigation is warranted and possibly treatment with nootropic medication.   In light of these findings, the following recommendations are provided.    RECOMMENDATIONS  Recommendations for treatment team:    Consider obtaining brain MRI scan (if not medically contraindicated) with particular attention to the medial temporal lobes. Also consider ordering standard dementia labs to rule out medical and/or reversible cause of dementia (TSH, B-12, RPR, etc.). Eventual treatment with nootropic medication may be warranted.     When interacting with Brad Jones, directions and information should be provided in a simple, straight forward manner, and the treatment team should avoid giving multiple instructions simultaneously. He may also benefit from being provided with multiple trials to learn new skills given the noted memory inefficiencies.    To the extent possible, multitasking should be avoided.   He requires more time than typical to process information. The treatment team may benefit from waiting for a verbal response to information before presenting additional information.    Be aware that he is suffering from mild visual spatial deficits of which he may not be aware. Taking this into consideration will help tailor therapy and keep accidents at bay as much as possible when he is trying to navigate his surroundings.    Performance will generally be best in a structured, routine, and familiar environment, as opposed to situations involving complex problems.   Recommendations for discharge planning:    Establish long-term follow-up care with a provider knowledgeable in evaluating and treating dementia.    Complete a comprehensive neuropsychological evaluation as an outpatient. This can be done through Orie Fisherman, PsyD by  calling the following number: [phone].    Maintain engagement in mentally, physically and cognitively stimulating activities.    Strive to maintain a healthy lifestyle (e.g., proper diet and exercise) in order to promote physical, cognitive and emotional health.    Due to the nature and severity of the symptoms noted during this evaluation, it is recommended that he at least initially obtain constant care and supervision following this hospitalization until a formal diagnosis is given.   FINAL DIAGNOSES:  Deconditioning  Memory lapses or loss Dementia, NOS (R/O Alzheimer's disease and/or cerebrovascular compromise)    Debbe Mounts, Psy.D.  Clinical Neuropsychologist

## 2012-08-22 NOTE — Progress Notes (Signed)
Occupational Therapy Session Note  Patient Details  Name: Brad Jones MRN: [id number] Date of Birth: 1927-06-02  Today's Date: 08/22/2012  Short Term Goals: Week 1:  OT Short Term Goal 1 (Week 1): Pt will peform all bathing sit to stand with supervision. OT Short Term Goal 2 (Week 1): Pt will perform dressing sit to stand with supervision. OT Short Term Goal 3 (Week 1): Pt will peform shower transfers with supervision using RW.  OT Short Term Goal 4 (Week 1): Pt will perform toilet transfers with supervision using 3:1 and elevated toilet and RW.    Skilled Therapeutic Interventions/Progress Updates:  Session Note: Time: 1400-1430 (30 mins) Pt with no report of pain.  Upon entering room, pt seated in w/c. Pt propelled w/c ~100 ft using BUE's. Pt on 3L O2 and OT checked pt's O2 (=low 70s). Pt took about 4 mins to get O2 >90%. OT increased O2->4 L. Pt educated on pursed-lip breathing and required max v.c's to utilize this technique throughout tx session. Once in rehab gym, skilled intervention focused on fxal mobility with and without R/W, dynamic standing balance without BUE supoprt, coordination, and overall activity tolerance. Pt requires frequent rest breaks secondary to decreased oxygen support/decreased endurance. At end of tx session, pt seated in w/c in rehab gym waiting for PT session. PT notified of pt's location.  Therapy Documentation Precautions:  Precautions Precautions: Fall Precaution Comments: O2 dependent 2-3  liters Restrictions Weight Bearing Restrictions: No  See FIM for current functional status  Therapy/Group: Individual Therapy  Kaleigh Spiegelman 08/22/2012, 7:31 AM

## 2012-08-22 NOTE — Progress Notes (Signed)
Reviewed and in agreement with treatment provided.  

## 2012-08-22 NOTE — Progress Notes (Signed)
Note reviewed and accurately reflects treatment session.   

## 2012-08-22 NOTE — Progress Notes (Signed)
Physical Therapy Session Note  Patient Details  Name: Brad Jones MRN: [id number] Date of Birth: 1927-11-14  Today's Date: 08/22/2012 Time: 14:37-15:07 Time Calculation (min): 30 min  Short Term Goals: Week 1:  PT Short Term Goal 1 (Week 1): STG = LTG due to short LOS,  Skilled Therapeutic Interventions/Progress Updates:  Tx focused on gait with RW, stairs, therex for strengthening, and standing balance for activity tolerance. Pt needed 5 seated rest breaks between activities, needing to be on 6L to maintain >90%.  Standing therex per HEP in WC bag x10 each of the following in // bars with UE support and cues for technique:  - heel/toe raises, mini squats, hip ABD, hip EXT, marching - seated LAQ  Standing ball toss x16min standing without UE support and close S for safety.   Gait in controlled environment with close S 2x80' with RW.  Up/down 5 stairs with bil rails and min-guard for steadying.      Therapy Documentation Precautions:  Precautions Precautions: Fall Precaution Comments: O2 dependent 2-3  liters Restrictions Weight Bearing Restrictions: No   Vital Signs: Oxygen Therapy O2 Device: Nasal cannula O2 Flow Rate (L/min): 2 L/min Pain: Pain Assessment Pain Assessment: No/denies pain Mobility:   Locomotion :    Trunk/Postural Assessment :    Balance:   Exercises:   Other Treatments:    See FIM for current functional status  Therapy/Group: Individual Therapy  Clydene Laming, PT, DPT  08/22/2012, 2:56 PM

## 2012-08-22 NOTE — Progress Notes (Signed)
Physical Therapy Session Note  Patient Details  Name: Brad Jones MRN: [id number] Date of Birth: [birth year]  Today's Date: 08/22/2012 Time: 0865-7846 Time Calculation (min): 28 min  Short Term Goals: Week 1:  PT Short Term Goal 1 (Week 1): STG = LTG due to short LOS, Mod I transfers and S gait and stairs with device  Skilled Therapeutic Interventions/Progress Updates:    Therapy session focused on bed mobility, w/c propulsion, and car transfers. Pt performed supine>sit with S and lower body dressing was performed sitting at EOB with mod A. Pt was able to perform sit >stand with close S using RW for UE support however pulled pants up with close S with B/L use of UE indicating the ability to perform standing dynamic balance with S. Pt performed stand pivot transfer with S for bed>w/c and from w/c<> car requiring minimal VC's for set up. Pt performed w/c propulsion for B/L UE strengthening and cardiovascular endurance with S for >150 ft. No c/o pain during therapy session and pt performed therapy on 2 L of O2 with seated rest breaks as needed due to decreased activity tolerance.   Therapy Documentation Precautions:  Precautions Precautions: Fall Precaution Comments: O2 dependent 2-3  liters Restrictions Weight Bearing Restrictions: No  See FIM for current functional status  Therapy/Group: Individual Therapy  [place], Brad Jones 08/22/2012, 8:53 AM

## 2012-08-22 NOTE — Progress Notes (Signed)
Occupational Therapy Session Note  Patient Details  Name: Brad Jones MRN: [id number] Date of Birth: 06/10/1927  Today's Date: 08/22/2012 Time: 0900-1000 Time Calculation (min): 60 min  Short Term Goals: Week 1:  OT Short Term Goal 1 (Week 1): Pt will peform all bathing sit to stand with supervision. OT Short Term Goal 2 (Week 1): Pt will perform dressing sit to stand with supervision. OT Short Term Goal 3 (Week 1): Pt will peform shower transfers with supervision using RW.  OT Short Term Goal 4 (Week 1): Pt will perform toilet transfers with supervision using 3:1 and elevated toilet and RW.    Skilled Therapeutic Interventions/Progress Updates:      Pt seen for BADL retraining of toileting, bathing, and dressing with a focus on activity tolerance and standing balance.  Pt was able to complete all skills with supervision and no rest breaks.  At completion of self care, pt taken to tub room to practice stepping in and out of tub using grab bars 3x with supervision only. Pt states he has a shower seat in tub and his son is installing grab bars this weekend.  Pt returned to room at end of session with quick release belt in place and call light in reach.   Therapy Documentation Precautions:  Precautions Precautions: Fall Precaution Comments: O2 dependent 2-3  liters Restrictions Weight Bearing Restrictions: No  Pain: No c/o pain.   ADL:  See FIM for current functional status  Therapy/Group: Individual Therapy  [name],[name] 08/22/2012, 11:53 AM

## 2012-08-23 ENCOUNTER — Inpatient Hospital Stay (HOSPITAL_COMMUNITY): Payer: Medicare Other | Admitting: Physical Therapy

## 2012-08-23 ENCOUNTER — Inpatient Hospital Stay (HOSPITAL_COMMUNITY): Payer: Medicare Other | Admitting: *Deleted

## 2012-08-23 LAB — GLUCOSE, CAPILLARY
Glucose-Capillary: 94 mg/dL (ref 70–99)
Glucose-Capillary: 94 mg/dL (ref 70–99)

## 2012-08-23 NOTE — Progress Notes (Signed)
Physical Therapy Note  Patient Details  Name: Leyton Magoon MRN: [id number] Date of Birth: [birth year] Today's Date: 08/23/2012  1000-1055 (55 minutes) individual Pain: No reported pain Other: Oxygen sats (resting) 96% on 2 L [place] Focus of treatment: Therapeutic exercise focused on activity tolerance (monitoring oxygen sats); gait training/endurance; bilateral LE strengthening in standing Treatment: Pt up in wc; transfers SBA RW; Nustep Level 4 X 10 minutes (Oxygen sats at 3 minutes = 92 % on 2 L [place]); standing bilateral LE strengthening X 20 - hip flexion, hip abduction; up/down 6 inch step alternating LEs, toe raises (using rail for support); gait - 2 minute walk test 182 feet ( norm in 2 minutes = 510 feet) using RW SBA.    Zunairah Devers,JIM 08/23/2012, 10:09 AM

## 2012-08-23 NOTE — Progress Notes (Signed)
Patient ID: Brad Jones, male   DOB: 06/12/1927, 79 y.o.   MRN: [id number] Subjective/Complaints: 79 y.o. male with history of HTN, DM, COPD, macular degeneration, CKD (does not want dialysis); admitted on 08/10/12 with h/o malaise, syncope leading to unresponsiveness in setting of severe CAP and acute on chronic renal failure. Patient intubated at [facility] and transferred to [facility] for treatment. He was treated with IV antibiotics for sepsis due to strep bacteremia. CT head with global atrophy with right frontal lobe infarct of indeterminate age and moderate SVD. CT chest with multifocal PNA with consolidation concerning for aspiration. 2D echo with EF 50-55% with grade 1 diastolic dysfunction and severe pulmonary HTN. Extubated on 07/17 and encephalopathy due to sepsis resolving. Fluid overload treated with diuresis  No issues overnite Eating breakfast  Review of Systems  Constitutional: Positive for malaise/fatigue.  All other systems reviewed and are negative.     Objective: Vital Signs: Blood pressure 155/88, pulse 70, temperature 98.8 F (37.1 C), temperature source Oral, resp. rate 18, height 5\' 8"  (1.727 m), weight 83.1 kg (183 lb 3.2 oz), SpO2 96.00%. No results found. Results for orders placed during the hospital encounter of 08/18/12 (from the past 72 hour(s))  GLUCOSE, CAPILLARY     Status: Abnormal   Collection Time    08/20/12 11:33 AM      Result Value Range   Glucose-Capillary 122 (*) 70 - 99 mg/dL   Comment 1 Notify RN    GLUCOSE, CAPILLARY     Status: None   Collection Time    08/20/12  4:37 PM      Result Value Range   Glucose-Capillary 90  70 - 99 mg/dL   Comment 1 Notify RN    GLUCOSE, CAPILLARY     Status: Abnormal   Collection Time    08/20/12  8:47 PM      Result Value Range   Glucose-Capillary 114 (*) 70 - 99 mg/dL   Comment 1 Notify RN    GLUCOSE, CAPILLARY     Status: None   Collection Time    08/21/12  7:25 AM      Result Value Range   Glucose-Capillary  90  70 - 99 mg/dL   Comment 1 Notify RN    GLUCOSE, CAPILLARY     Status: Abnormal   Collection Time    08/21/12 11:09 AM      Result Value Range   Glucose-Capillary 106 (*) 70 - 99 mg/dL   Comment 1 Notify RN    GLUCOSE, CAPILLARY     Status: Abnormal   Collection Time    08/21/12  4:44 PM      Result Value Range   Glucose-Capillary 69 (*) 70 - 99 mg/dL   Comment 1 Notify RN    GLUCOSE, CAPILLARY     Status: Abnormal   Collection Time    08/21/12  5:57 PM      Result Value Range   Glucose-Capillary 151 (*) 70 - 99 mg/dL  GLUCOSE, CAPILLARY     Status: Abnormal   Collection Time    08/21/12  8:39 PM      Result Value Range   Glucose-Capillary 152 (*) 70 - 99 mg/dL   Comment 1 Documented in Chart     Comment 2 Notify RN    BASIC METABOLIC PANEL     Status: Abnormal   Collection Time    08/22/12  6:05 AM      Result Value Range   Sodium  140  135 - 145 mEq/L   Potassium 5.1  3.5 - 5.1 mEq/L   Chloride 101  96 - 112 mEq/L   CO2 30  19 - 32 mEq/L   Glucose, Bld 90  70 - 99 mg/dL   BUN 36 (*) 6 - 23 mg/dL   Creatinine, Ser 1.61 (*) 0.50 - 1.35 mg/dL   Calcium 9.4  8.4 - 09.6 mg/dL   GFR calc non Af Amer 16 (*) >90 mL/min   GFR calc Af Amer 19 (*) >90 mL/min   Comment:            The eGFR has been calculated     using the CKD EPI equation.     This calculation has not been     validated in all clinical     situations.     eGFR's persistently     <90 mL/min signify     possible Chronic Kidney Disease.  GLUCOSE, CAPILLARY     Status: None   Collection Time    08/22/12  7:15 AM      Result Value Range   Glucose-Capillary 84  70 - 99 mg/dL  GLUCOSE, CAPILLARY     Status: Abnormal   Collection Time    08/22/12 11:13 AM      Result Value Range   Glucose-Capillary 110 (*) 70 - 99 mg/dL   Comment 1 Notify RN    GLUCOSE, CAPILLARY     Status: None   Collection Time    08/22/12  5:00 PM      Result Value Range   Glucose-Capillary 70  70 - 99 mg/dL  CBC     Status:  Abnormal   Collection Time    08/22/12  6:57 PM      Result Value Range   WBC 11.4 (*) 4.0 - 10.5 K/uL   RBC 4.39  4.22 - 5.81 MIL/uL   Hemoglobin 11.3 (*) 13.0 - 17.0 g/dL   HCT 04.5 (*) 40.9 - 81.1 %   MCV 82.0  78.0 - 100.0 fL   MCH 25.7 (*) 26.0 - 34.0 pg   MCHC 31.4  30.0 - 36.0 g/dL   RDW 91.4  78.2 - 95.6 %   Platelets 199  150 - 400 K/uL  GLUCOSE, CAPILLARY     Status: Abnormal   Collection Time    08/22/12  8:53 PM      Result Value Range   Glucose-Capillary 125 (*) 70 - 99 mg/dL   Comment 1 Notify RN    GLUCOSE, CAPILLARY     Status: None   Collection Time    08/23/12  7:31 AM      Result Value Range   Glucose-Capillary 91  70 - 99 mg/dL   Comment 1 Notify RN        Constitutional: He appears well-developed and well-nourished.  Elderly, frail appearing male  HENT:  Head: Normocephalic and atraumatic.  Eyes: Conjunctivae and EOM are normal. Pupils are equal, round, and reactive to light.  Neck: Normal range of motion. Neck supple.  Cardiovascular: Normal rate and regular rhythm. No wheezes or rales  Pulmonary/Chest: Effort normal. No respiratory distress.  No cough.  Abdominal: Soft. Bowel sounds are normal. He exhibits no distension. There is no tenderness.  Musculoskeletal: He exhibits no edema and no tenderness.  Bilateral knees with large psoriatic patches. No pain with ROM.  Neurological: He is alert.  Oriented to self and place, reason he's here.  RUE 4/5. LUE  is 3+/5. LLE is 2+ to 3/5 prox to 4- at ankle. RLE is 3/5 prox to 4/5 distally. Sensation grossly intact in all 4. Speech dysarthric. But no focal CN findings. Follows 2step commands and answered biographical questions. Has fair insight and awareness. Cooperates with the entirety of my exam.  Skin: Skin is warm and dry.   Assessment/Plan: 1. Functional deficits secondary to Deconditioning following pneumonia, bacteremia, respiratory failure which require 3+ hours per day of interdisciplinary therapy  in a comprehensive inpatient rehab setting. Physiatrist is providing close team supervision and 24 hour management of active medical problems listed below. Physiatrist and rehab team continue to assess barriers to discharge/monitor patient progress toward functional and medical goals.  FIM: FIM - Bathing Bathing Steps Patient Completed: Chest;Right Arm;Left Arm;Abdomen;Left upper leg;Right upper leg;Buttocks;Front perineal area;Right lower leg (including foot);Left lower leg (including foot) Bathing: 5: Supervision: Safety issues/verbal cues  FIM - Upper Body Dressing/Undressing Upper body dressing/undressing steps patient completed: Thread/unthread right sleeve of pullover shirt/dresss;Thread/unthread left sleeve of pullover shirt/dress;Put head through opening of pull over shirt/dress;Pull shirt over trunk Upper body dressing/undressing: 5: Set-up assist to: Obtain clothing/put away FIM - Lower Body Dressing/Undressing Lower body dressing/undressing steps patient completed: Thread/unthread left pants leg;Thread/unthread right pants leg;Pull pants up/down;Don/Doff right sock;Don/Doff left sock;Fasten/unfasten pants;Don/Doff right shoe;Don/Doff left shoe;Fasten/unfasten right shoe;Fasten/unfasten left shoe Lower body dressing/undressing: 5: Set-up assist to: Obtain clothing  FIM - Toileting Toileting steps completed by patient: Adjust clothing prior to toileting;Performs perineal hygiene;Adjust clothing after toileting Toileting Assistive Devices: Grab bar or rail for support Toileting: 5: Supervision: Safety issues/verbal cues  FIM - Diplomatic Services operational officer Devices: Art gallery manager Transfers: 5-To toilet/BSC: Supervision (verbal cues/safety issues);5-From toilet/BSC: Supervision (verbal cues/safety issues)  FIM - Press photographer Assistive Devices: Arm rests;Walker Bed/Chair Transfer: 5: Bed > Chair or W/C: Supervision (verbal cues/safety issues);5:  Chair or W/C > Bed: Supervision (verbal cues/safety issues);5: Supine > Sit: Supervision (verbal cues/safety issues)  FIM - Locomotion: Wheelchair Distance: 100 Locomotion: Wheelchair: 0: Activity did not occur FIM - Locomotion: Ambulation Locomotion: Ambulation Assistive Devices: Designer, industrial/product Ambulation/Gait Assistance: 5: Supervision Locomotion: Ambulation: 2: Travels 50 - 149 ft with supervision/safety issues  Comprehension Comprehension Mode: Auditory Comprehension: 6-Follows complex conversation/direction: With extra time/assistive device  Expression Expression Mode: Verbal Expression: 5-Expresses complex 90% of the time/cues < 10% of the time  Social Interaction Social Interaction: 5-Interacts appropriately 90% of the time - Needs monitoring or encouragement for participation or interaction.  Problem Solving Problem Solving: 5-Solves complex 90% of the time/cues < 10% of the time  Memory Memory: 4-Recognizes or recalls 75 - 89% of the time/requires cueing 10 - 24% of the time :   Medical Problem List and Plan:  1. DVT Prophylaxis/Anticoagulation: Pharmaceutical: Lovenox  2. Pain Management: N/A  3. Mood: Pleasant and appropriate today. Will have LCSW follow for evaluation.  4. Neuropsych: This patient is capable of making decisions on his own behalf.  5. CKD: Stage IV. Does not want dialysis. Will need to monitor for fluid overload and/or signs of uremia. Off Lasix due to acute on chronic renal failure. Resume calcitriol.  6. CHF: Will check daily weights. Add low salt restrictions. Monitor for signs of overload--likely exacerbated by CKD. Resume lasix if indicated.  7. Diarrhea: Probiotic added. Will add a dose of questran tonight to see if this helps.  8. COPD: Will continue spirivia. Will order rescue inhaler for use prn. Continue oxygen at nights.  9. DM type 2 diet  controlled: Will monitor with ac/hs cbg checks. Has been hypoglycemic in am. Add HS snack.  10  Thrombocytopenia: monitor lovenox.  11. H/o prostate cancer: Reports worsening of frequency/urgency. Off Hytrin currently. Will check PVRs. Toilet while awake.  12. Psoriasis: Resume lidex to psoriatic lesions. Will add Eucerin cream additionally to dry areas.  LOS (Days) 5 A FACE TO FACE EVALUATION WAS PERFORMED  Moss Berry E 08/23/2012, 10:25 AM

## 2012-08-23 NOTE — Progress Notes (Signed)
Occupational Therapy Session Note  Patient Details  Name: Ulises Wolfinger MRN: [id number] Date of Birth: [birth year]  Today's Date: 08/23/2012 Time:  -   0800-0900  (60 min)  1st session                  1430-1500  (30 min)  2nd session    Short Term Goals: Week 1:  OT Short Term Goal 1 (Week 1): Pt will peform all bathing sit to stand with supervision. OT Short Term Goal 2 (Week 1): Pt will perform dressing sit to stand with supervision. OT Short Term Goal 3 (Week 1): Pt will peform shower transfers with supervision using RW.  OT Short Term Goal 4 (Week 1): Pt will perform toilet transfers with supervision using 3:1 and elevated toilet and RW.        Skilled Therapeutic Interventions/Progress Updates:    1st session:  Pt seen for BADL retraining of toileting, bathing, and dressing with a focus on activity tolerance and standing balance. Pt was able to complete all activities with supervision.  Pt elected to walk with RW to toilet.  He had a BM/urine and was sup with toileting and toilet transfer.  He ambulated to shower seat and bathed self with sit to stand using grab bars.  He transferred to shower bench with supervision plus grab grab bars.    He ambulated out to sink and dressed self.  He had no rest breaks. Pt. On 2 liters of oxygen during session.    2nd session:1430-1500  (30 min)  Pain:  5/10 right elbow pain   Individual session Engaged in wc mobility, transfers, UE therapeutic exercises with arm egometer.  Did 5 minutes at 3 workload.  Pt.propelled self to gym with wc with minimal cues to turn into doors.  Rolled self back to room.  Pt left in wc.  Oxygen sat=  98 % on 2 liters.     Therapy Documentation Precautions:  Precautions Precautions: Fall Precaution Comments: O2 dependent 2-3  liters Restrictions Weight Bearing Restrictions: No    Vital Signs: SpO2: 96 % O2 Device: Nasal cannula   Pain:  3/10 buttocks  1st session             None;          2nd session     See  FIM for current functional status  Therapy/Group: Individual Therapy  Humberto Seals 08/23/2012, 8:05 AM

## 2012-08-23 NOTE — Progress Notes (Signed)
Physical Therapy Session Note  Patient Details  Name: Brad Jones MRN: [id number] Date of Birth: 10-Feb-1927  Today's Date: 08/23/2012 Time: 1345-1430 Time Calculation (min): 45 min  Short Term Goals: Week 1:  PT Short Term Goal 1 (Week 1): STG = LTG, S overall   Skilled Therapeutic Interventions/Progress Updates:      Therapy Documentation Precautions:  Precautions Precautions: Fall Precaution Comments: O2 dependent 2-3  liters Restrictions Weight Bearing Restrictions: No Pain: denies pain currently O2 via [place] 2.0L throughout tx session Therapeutic Activity:(15') Transfers sit<->stand S/Mod-I, Toilet transfers S/Mod-I  Gait Training:(30') using RW 2 x 200', 2 x 40' and 3 x 150' with rest breaks in between.  Therapy/Group: Individual Therapy  Keoni Risinger J 08/23/2012, 2:00 PM

## 2012-08-24 ENCOUNTER — Inpatient Hospital Stay (HOSPITAL_COMMUNITY): Payer: Medicare Other | Admitting: Physical Therapy

## 2012-08-24 LAB — GLUCOSE, CAPILLARY
Glucose-Capillary: 112 mg/dL — ABNORMAL HIGH (ref 70–99)
Glucose-Capillary: 94 mg/dL (ref 70–99)
Glucose-Capillary: 102 mg/dL — ABNORMAL HIGH (ref 70–99)

## 2012-08-24 LAB — CULTURE, BLOOD (ROUTINE X 2): Culture: NO GROWTH

## 2012-08-24 NOTE — Progress Notes (Signed)
Patient ID: Alim Cattell, male   DOB: 28-Jan-1928, 79 y.o.   MRN: [id number] Subjective/Complaints: 79 y.o. male with history of HTN, DM, COPD, macular degeneration, CKD (does not want dialysis); admitted on 08/10/12 with h/o malaise, syncope leading to unresponsiveness in setting of severe CAP and acute on chronic renal failure. Patient intubated at [facility] and transferred to [facility] for treatment. He was treated with IV antibiotics for sepsis due to strep bacteremia. CT head with global atrophy with right frontal lobe infarct of indeterminate age and moderate SVD. CT chest with multifocal PNA with consolidation concerning for aspiration. 2D echo with EF 50-55% with grade 1 diastolic dysfunction and severe pulmonary HTN. Extubated on 07/17 and encephalopathy due to sepsis resolving. Fluid overload treated with diuresis  Breathing feels ok  Review of Systems  Constitutional: Positive for malaise/fatigue.  All other systems reviewed and are negative.     Objective: Vital Signs: Blood pressure 116/60, pulse 76, temperature 98.2 F (36.8 C), temperature source Oral, resp. rate 17, height 5\' 8"  (1.727 m), weight 83.1 kg (183 lb 3.2 oz), SpO2 98.00%. No results found. Results for orders placed during the hospital encounter of 08/18/12 (from the past 72 hour(s))  GLUCOSE, CAPILLARY     Status: Abnormal   Collection Time    08/21/12 11:09 AM      Result Value Range   Glucose-Capillary 106 (*) 70 - 99 mg/dL   Comment 1 Notify RN    GLUCOSE, CAPILLARY     Status: Abnormal   Collection Time    08/21/12  4:44 PM      Result Value Range   Glucose-Capillary 69 (*) 70 - 99 mg/dL   Comment 1 Notify RN    GLUCOSE, CAPILLARY     Status: Abnormal   Collection Time    08/21/12  5:57 PM      Result Value Range   Glucose-Capillary 151 (*) 70 - 99 mg/dL  GLUCOSE, CAPILLARY     Status: Abnormal   Collection Time    08/21/12  8:39 PM      Result Value Range   Glucose-Capillary 152 (*) 70 - 99 mg/dL   Comment  1 Documented in Chart     Comment 2 Notify RN    BASIC METABOLIC PANEL     Status: Abnormal   Collection Time    08/22/12  6:05 AM      Result Value Range   Sodium 140  135 - 145 mEq/L   Potassium 5.1  3.5 - 5.1 mEq/L   Chloride 101  96 - 112 mEq/L   CO2 30  19 - 32 mEq/L   Glucose, Bld 90  70 - 99 mg/dL   BUN 36 (*) 6 - 23 mg/dL   Creatinine, Ser 4.09 (*) 0.50 - 1.35 mg/dL   Calcium 9.4  8.4 - 81.1 mg/dL   GFR calc non Af Amer 16 (*) >90 mL/min   GFR calc Af Amer 19 (*) >90 mL/min   Comment:            The eGFR has been calculated     using the CKD EPI equation.     This calculation has not been     validated in all clinical     situations.     eGFR's persistently     <90 mL/min signify     possible Chronic Kidney Disease.  GLUCOSE, CAPILLARY     Status: None   Collection Time    08/22/12  7:15 AM      Result Value Range   Glucose-Capillary 84  70 - 99 mg/dL  GLUCOSE, CAPILLARY     Status: Abnormal   Collection Time    08/22/12 11:13 AM      Result Value Range   Glucose-Capillary 110 (*) 70 - 99 mg/dL   Comment 1 Notify RN    GLUCOSE, CAPILLARY     Status: None   Collection Time    08/22/12  5:00 PM      Result Value Range   Glucose-Capillary 70  70 - 99 mg/dL  CBC     Status: Abnormal   Collection Time    08/22/12  6:57 PM      Result Value Range   WBC 11.4 (*) 4.0 - 10.5 K/uL   RBC 4.39  4.22 - 5.81 MIL/uL   Hemoglobin 11.3 (*) 13.0 - 17.0 g/dL   HCT 95.6 (*) 21.3 - 08.6 %   MCV 82.0  78.0 - 100.0 fL   MCH 25.7 (*) 26.0 - 34.0 pg   MCHC 31.4  30.0 - 36.0 g/dL   RDW 57.8  46.9 - 62.9 %   Platelets 199  150 - 400 K/uL  GLUCOSE, CAPILLARY     Status: Abnormal   Collection Time    08/22/12  8:53 PM      Result Value Range   Glucose-Capillary 125 (*) 70 - 99 mg/dL   Comment 1 Notify RN    GLUCOSE, CAPILLARY     Status: None   Collection Time    08/23/12  7:31 AM      Result Value Range   Glucose-Capillary 91  70 - 99 mg/dL   Comment 1 Notify RN     GLUCOSE, CAPILLARY     Status: Abnormal   Collection Time    08/23/12 11:51 AM      Result Value Range   Glucose-Capillary 109 (*) 70 - 99 mg/dL   Comment 1 Notify RN    GLUCOSE, CAPILLARY     Status: None   Collection Time    08/23/12  4:46 PM      Result Value Range   Glucose-Capillary 94  70 - 99 mg/dL   Comment 1 Notify RN    GLUCOSE, CAPILLARY     Status: None   Collection Time    08/23/12  9:15 PM      Result Value Range   Glucose-Capillary 94  70 - 99 mg/dL      Constitutional: He appears well-developed and well-nourished.  Elderly, frail appearing male  HENT:  Head: Normocephalic and atraumatic.  Eyes: Conjunctivae and EOM are normal. Pupils are equal, round, and reactive to light.  Neck: Normal range of motion. Neck supple.  Cardiovascular: Normal rate and regular rhythm. No wheezes or rales  Pulmonary/Chest: Effort normal. No respiratory distress.  No cough.  Abdominal: Soft. Bowel sounds are normal. He exhibits no distension. There is no tenderness.  Musculoskeletal: He exhibits no edema and no tenderness.  Bilateral knees with large psoriatic patches. No pain with ROM.  Neurological: He is alert.  Oriented to self and place, reason he's here.  RUE 4/5. LUE is 3+/5. LLE is 2+ to 3/5 prox to 4- at ankle. RLE is 3/5 prox to 4/5 distally. Sensation grossly intact in all 4. Speech dysarthric. But no focal CN findings. Follows 2step commands and answered biographical questions. Has fair insight and awareness. Cooperates with the entirety of my exam.  Skin: Skin is  warm and dry.   Assessment/Plan: 1. Functional deficits secondary to Deconditioning following pneumonia, bacteremia, respiratory failure which require 3+ hours per day of interdisciplinary therapy in a comprehensive inpatient rehab setting. Physiatrist is providing close team supervision and 24 hour management of active medical problems listed below. Physiatrist and rehab team continue to assess barriers to  discharge/monitor patient progress toward functional and medical goals.  FIM: FIM - Bathing Bathing Steps Patient Completed: Chest;Right Arm;Left Arm;Abdomen;Left upper leg;Right upper leg;Buttocks;Front perineal area;Right lower leg (including foot);Left lower leg (including foot) Bathing: 5: Supervision: Safety issues/verbal cues  FIM - Upper Body Dressing/Undressing Upper body dressing/undressing steps patient completed: Thread/unthread right sleeve of pullover shirt/dresss;Thread/unthread left sleeve of pullover shirt/dress;Put head through opening of pull over shirt/dress;Pull shirt over trunk Upper body dressing/undressing: 5: Set-up assist to: Obtain clothing/put away FIM - Lower Body Dressing/Undressing Lower body dressing/undressing steps patient completed: Thread/unthread left pants leg;Thread/unthread right pants leg;Pull pants up/down;Don/Doff right sock;Don/Doff left sock;Fasten/unfasten pants;Don/Doff right shoe;Don/Doff left shoe;Fasten/unfasten right shoe;Fasten/unfasten left shoe Lower body dressing/undressing: 5: Set-up assist to: Obtain clothing  FIM - Toileting Toileting steps completed by patient: Adjust clothing prior to toileting;Performs perineal hygiene;Adjust clothing after toileting Toileting Assistive Devices: Grab bar or rail for support Toileting: 5: Supervision: Safety issues/verbal cues  FIM - Diplomatic Services operational officer Devices: Art gallery manager Transfers: 5-To toilet/BSC: Supervision (verbal cues/safety issues);5-From toilet/BSC: Supervision (verbal cues/safety issues)  FIM - Press photographer Assistive Devices: Arm rests;Walker Bed/Chair Transfer: 5: Bed > Chair or W/C: Supervision (verbal cues/safety issues);5: Chair or W/C > Bed: Supervision (verbal cues/safety issues)  FIM - Locomotion: Wheelchair Distance: 100 Locomotion: Wheelchair: 0: Activity did not occur FIM - Locomotion: Ambulation Locomotion: Ambulation  Assistive Devices: Designer, industrial/product Ambulation/Gait Assistance: 5: Supervision Locomotion: Ambulation: 2: Travels 50 - 149 ft with supervision/safety issues  Comprehension Comprehension Mode: Auditory Comprehension: 7-Follows complex conversation/direction: With no assist  Expression Expression Mode: Verbal Expression: 7-Expresses complex ideas: With no assist  Social Interaction Social Interaction: 7-Interacts appropriately with others - No medications needed.  Problem Solving Problem Solving: 7-Solves complex problems: Recognizes & self-corrects  Memory Memory: 7-Complete Independence: No helper :   Medical Problem List and Plan:  1. DVT Prophylaxis/Anticoagulation: Pharmaceutical: Lovenox  2. Pain Management: N/A  3. Mood: Pleasant and appropriate today. Will have LCSW follow for evaluation.  4. Neuropsych: This patient is capable of making decisions on his own behalf.  5. CKD: Stage IV. Does not want dialysis. Will need to monitor for fluid overload and/or signs of uremia. Off Lasix due to acute on chronic renal failure. Resume calcitriol.  6. CHF: Will check daily weights. Add low salt restrictions. Monitor for signs of overload--likely exacerbated by CKD. Resume lasix if indicated.  7. Diarrhea: Probiotic added. Will add a dose of questran tonight to see if this helps.  8. COPD: Will continue spirivia. Will order rescue inhaler for use prn. Continue oxygen at nights.  9. DM type 2 diet controlled: Will monitor with ac/hs cbg checks. Has been hypoglycemic in am. Add HS snack.  10 Thrombocytopenia: monitor lovenox.  11. H/o prostate cancer: Reports worsening of frequency/urgency. Off Hytrin currently secondary to low BPs. Will check PVRs. Toilet while awake.  12. Psoriasis: Resume lidex to psoriatic lesions. Will add Eucerin cream additionally to dry areas.  LOS (Days) 6 A FACE TO FACE EVALUATION WAS PERFORMED  KIRSTEINS,ANDREW E 08/24/2012, 9:12 AM

## 2012-08-24 NOTE — Progress Notes (Signed)
Pt refused CPAP

## 2012-08-24 NOTE — Progress Notes (Signed)
Physical Therapy Note  Patient Details  Name: Brad Jones MRN: [id number] Date of Birth: Dec 19, 1927 Today's Date: 08/24/2012  1000-1055 (55 minutes) individual Pain: no reported pain Other: Oxygen sats 95% on 2 L [place] at rest; pulse 71 Focus of treatment: Therapeutic exercise focused on bilateral LE strengthening/ activity tolerance; gait training/endurance Treatment:transfers stand/turn RW SBA with vcs for brakes; wc mobility 120 feet X 2 SBA on level surfaces; Nustep LEVEL 5 X 10 minutes (Oxygen sats > 92% during this activity); gait up/down 3 steps (4 inch) with bilateral or one rail right or left min to SBA; gait 160 feet RW SBA .    Beatrice Ziehm,JIM 08/24/2012, 10:12 AM

## 2012-08-25 ENCOUNTER — Inpatient Hospital Stay (HOSPITAL_COMMUNITY): Payer: Medicare Other | Admitting: Occupational Therapy

## 2012-08-25 ENCOUNTER — Inpatient Hospital Stay (HOSPITAL_COMMUNITY): Payer: Medicare Other | Admitting: Speech Pathology

## 2012-08-25 ENCOUNTER — Inpatient Hospital Stay (HOSPITAL_COMMUNITY): Payer: Medicare Other

## 2012-08-25 DIAGNOSIS — A4189 Other specified sepsis: Secondary | ICD-10-CM

## 2012-08-25 DIAGNOSIS — R5381 Other malaise: Secondary | ICD-10-CM

## 2012-08-25 LAB — GLUCOSE, CAPILLARY
Glucose-Capillary: 73 mg/dL (ref 70–99)
Glucose-Capillary: 83 mg/dL (ref 70–99)
Glucose-Capillary: 92 mg/dL (ref 70–99)

## 2012-08-25 NOTE — Progress Notes (Signed)
Speech Language Pathology Daily Session Note  Patient Details  Name: Brad Jones MRN: [id number] Date of Birth: August 15, 1927  Today's Date: 08/25/2012 Time: 1035-1100 Time Calculation (min): 25 min  Short Term Goals: Week 1: SLP Short Term Goal 1 (Week 1): Pt will utilize memory compensatory strategies to recall new, daily information with supervision verbal and question cues.  SLP Short Term Goal 2 (Week 1): Pt will demonstrate functional problem solving for basic and familiar tasks with Mod I.   Skilled Therapeutic Interventions: Skilled treatment session focused on addressing cognition goals.  SLP facilitated session with new learning task to address working memory and problem solving skills.  SLP demonstrated card game x1 and then patient required Supervision level verbal cues to self-monitor and correct calculation errors.  Patient recalled procedures without cues and also navigate back to room at end of session without cues.  Patient expressed desire to go home to wife as soon as possible.  Continue with current plan of care.   FIM:  Comprehension Comprehension Mode: Auditory Comprehension: 6-Follows complex conversation/direction: With extra time/assistive device Expression Expression Mode: Verbal Expression: 6-Expresses complex ideas: With extra time/assistive device Social Interaction Social Interaction: 7-Interacts appropriately with others - No medications needed. Problem Solving Problem Solving: 5-Solves complex 90% of the time/cues < 10% of the time Memory Memory: 6-More than reasonable amt of time  Pain Pain Assessment Pain Assessment: No/denies pain  Therapy/Group: Individual Therapy  Charlane Ferretti., CCC-SLP [phone]  Idaly Verret 08/25/2012, 11:13 AM

## 2012-08-25 NOTE — Progress Notes (Signed)
Social Work Patient ID: Brad Jones, male   DOB: December 03, 1927, 79 y.o.   MRN: [id number] Carolyn-PT feels pt will be ready to discharge before Sat, she is thinking Thurs.  Will discuss with other team members and talk with MD. Sherron Monday with carolyn-daughter to give the heads up and she was pleased with how well her father is doing.  She reports someone can stay with  Them Thurs and Friday to make sure they are doing ok at home.  Will confirm moving up discharge tomorrow and contact daughter to confirm.

## 2012-08-25 NOTE — Progress Notes (Signed)
Physical Therapy Session Note  Patient Details  Name: Brad Jones MRN: [id number] Date of Birth: [birth year]  Today's Date: 08/25/2012 Time: 1300-1400 Time Calculation (min): 60 min  Short Term Goals: Week 1:  PT Short Term Goal 1 (Week 1): STG = LTG, S overall     Skilled Therapeutic Interventions/Progress Updates:  Treatment focused on activity tolerance, Fall Prevention 1 exercises, simulated car transfer, gait training  Car transfer with supervision, VCs for safe hand placement.  Gait on level tile x 150' x 2 with supervision.  Gait up/down 4 steps with R rail, step through pattern ascending; step to pattern descending, cues to lead with L foot when descending.  Pt has had R CVAs, and is a little weaker on L side; did not have Rehab PTA.  W/c propulsion for activity tolerance, using bil UEs.  X 100' with supervision for steering.  Fall Prevention 1 : mini squats, R and L hip abduction in standing, calf raises with assistance, toe raises with assistance.    O2 sats 88% with exertion, and were very slow to rise above 90% when on 2 L.  O2 increased to 3L/min during session. O2 set at 2L when pt was back in room.    Therapy Documentation Precautions:  Precautions Precautions: Fall Precaution Comments: O2 dependent 2-3  liters Restrictions Weight Bearing Restrictions: No   Pain: Pain Assessment Pain Assessment: No/denies pain   Locomotion : Ambulation Ambulation/Gait Assistance: 5: Supervision Wheelchair Mobility Distance: 100       See FIM for current functional status  Therapy/Group: Individual Therapy  Shaunessy Dobratz 08/25/2012, 3:03 PM

## 2012-08-25 NOTE — Progress Notes (Signed)
Occupational Therapy Session Note  Patient Details  Name: Brad Jones MRN: [id number] Date of Birth: 1927/09/22  Today's Date: 08/25/2012 Time: 1115-1200 Time Calculation (min): 45 min  Short Term Goals: Week 1:  OT Short Term Goal 1 (Week 1): Pt will peform all bathing sit to stand with supervision. OT Short Term Goal 2 (Week 1): Pt will perform dressing sit to stand with supervision. OT Short Term Goal 3 (Week 1): Pt will peform shower transfers with supervision using RW.  OT Short Term Goal 4 (Week 1): Pt will perform toilet transfers with supervision using 3:1 and elevated toilet and RW.    Skilled Therapeutic Interventions/Progress Updates:  Patient resting in w/c upon arrival.  Patient propelled w/c part way to therapy apartment then transitioned to walker once in kitchen.  Patient was supervision for simple tasks of scrambling and cooking an egg on top of the stove.  Patient required vcs due to unfamiliar with stove, location of items needed, and walker safety.  Patient may perform this task with less vcs in his familiar environment at home.  Recommended that patient use the counter for stability when maneuvering in the kitchen secondary to the walker seemed to sometimes get in the way.  Therapy Documentation Precautions:  Precautions Precautions: Fall Precaution Comments: O2 dependent 2-3  liters Restrictions Weight Bearing Restrictions: No Pain: Pain Assessment Pain Assessment: No/denies pain  Therapy/Group: Individual Therapy  Caylan Chenard 08/25/2012, 12:07 PM

## 2012-08-25 NOTE — Progress Notes (Signed)
Occupational Therapy Session Note  Patient Details  Name: Brad Jones MRN: [id number] Date of Birth: [birth year]  Today's Date: 08/25/2012 Time: 0900-1000 Time Calculation (min): 60 min  Short Term Goals: Week 1:  OT Short Term Goal 1 (Week 1): Pt will peform all bathing sit to stand with supervision. OT Short Term Goal 2 (Week 1): Pt will perform dressing sit to stand with supervision. OT Short Term Goal 3 (Week 1): Pt will peform shower transfers with supervision using RW.  OT Short Term Goal 4 (Week 1): Pt will perform toilet transfers with supervision using 3:1 and elevated toilet and RW.        Skilled Therapeutic Interventions/Progress Updates:      Pt seen for BADL retraining of toileting, bathing, and dressing with a focus on functional mobility with RW.  Pt moved safely and smoothly this am, walking in and out of bathroom with supervision only with RW.  Supervision is required due to decreased vision and assistance to manage the O2 tubing.  Pt reports that PTA he would remove O2 before walking into the bathroom to toilet and/or shower and would leave it off for the duration until he walked back to his chair.  Tomorrow we will check O2 sat levels without O2 support during toileting and bathing.  Once in bathroom, he was mod I with toileting and bathing.  He also donned all clothing (button up pants and belt) without assist.  Pt has a BSC that he can place next to bed so he will only need to do a stand pivot for night time toileting. Pt is quite anxious to return home.  Pt resting in w/c with quick release belt and call light in place.  Therapy Documentation Precautions:  Precautions Precautions: Fall Precaution Comments: O2 dependent 2-3  liters Restrictions Weight Bearing Restrictions: No  Pain: Pain Assessment Pain Assessment: No/denies pain ADL:  See FIM for current functional status  Therapy/Group: Individual Therapy  Teoman Giraud 08/25/2012, 11:37 AM

## 2012-08-25 NOTE — Progress Notes (Signed)
Patient ID: Brad Jones, male   DOB: Aug 07, 1927, 79 y.o.   MRN: [id number] Subjective/Complaints: 79 y.o. male with history of HTN, DM, COPD, macular degeneration, CKD (does not want dialysis); admitted on 08/10/12 with h/o malaise, syncope leading to unresponsiveness in setting of severe CAP and acute on chronic renal failure. Patient intubated at [facility] and transferred to [facility] for treatment. He was treated with IV antibiotics for sepsis due to strep bacteremia. CT head with global atrophy with right frontal lobe infarct of indeterminate age and moderate SVD. CT chest with multifocal PNA with consolidation concerning for aspiration. 2D echo with EF 50-55% with grade 1 diastolic dysfunction and severe pulmonary HTN. Extubated on 07/17 and encephalopathy due to sepsis resolving. Fluid overload treated with diuresis  Anxious to go home , has elderly wife  Review of Systems  Constitutional: Positive for malaise/fatigue.  All other systems reviewed and are negative.     Objective: Vital Signs: Blood pressure 132/73, pulse 70, temperature 98.2 F (36.8 C), temperature source Axillary, resp. rate 19, height 5\' 8"  (1.727 m), weight 83.1 kg (183 lb 3.2 oz), SpO2 94.00%. No results found. Results for orders placed during the hospital encounter of 08/18/12 (from the past 72 hour(s))  GLUCOSE, CAPILLARY     Status: Abnormal   Collection Time    08/22/12 11:13 AM      Result Value Range   Glucose-Capillary 110 (*) 70 - 99 mg/dL   Comment 1 Notify RN    GLUCOSE, CAPILLARY     Status: None   Collection Time    08/22/12  5:00 PM      Result Value Range   Glucose-Capillary 70  70 - 99 mg/dL  CBC     Status: Abnormal   Collection Time    08/22/12  6:57 PM      Result Value Range   WBC 11.4 (*) 4.0 - 10.5 K/uL   RBC 4.39  4.22 - 5.81 MIL/uL   Hemoglobin 11.3 (*) 13.0 - 17.0 g/dL   HCT 46.9 (*) 62.9 - 52.8 %   MCV 82.0  78.0 - 100.0 fL   MCH 25.7 (*) 26.0 - 34.0 pg   MCHC 31.4  30.0 - 36.0 g/dL    RDW 41.3  24.4 - 01.0 %   Platelets 199  150 - 400 K/uL  GLUCOSE, CAPILLARY     Status: Abnormal   Collection Time    08/22/12  8:53 PM      Result Value Range   Glucose-Capillary 125 (*) 70 - 99 mg/dL   Comment 1 Notify RN    GLUCOSE, CAPILLARY     Status: None   Collection Time    08/23/12  7:31 AM      Result Value Range   Glucose-Capillary 91  70 - 99 mg/dL   Comment 1 Notify RN    GLUCOSE, CAPILLARY     Status: Abnormal   Collection Time    08/23/12 11:51 AM      Result Value Range   Glucose-Capillary 109 (*) 70 - 99 mg/dL   Comment 1 Notify RN    GLUCOSE, CAPILLARY     Status: None   Collection Time    08/23/12  4:46 PM      Result Value Range   Glucose-Capillary 94  70 - 99 mg/dL   Comment 1 Notify RN    GLUCOSE, CAPILLARY     Status: None   Collection Time    08/23/12  9:15 PM  Result Value Range   Glucose-Capillary 94  70 - 99 mg/dL  GLUCOSE, CAPILLARY     Status: None   Collection Time    08/24/12  7:39 AM      Result Value Range   Glucose-Capillary 96  70 - 99 mg/dL   Comment 1 Documented in Chart    GLUCOSE, CAPILLARY     Status: Abnormal   Collection Time    08/24/12  8:03 AM      Result Value Range   Glucose-Capillary 112 (*) 70 - 99 mg/dL  GLUCOSE, CAPILLARY     Status: None   Collection Time    08/24/12 11:40 AM      Result Value Range   Glucose-Capillary 94  70 - 99 mg/dL  GLUCOSE, CAPILLARY     Status: Abnormal   Collection Time    08/24/12  4:32 PM      Result Value Range   Glucose-Capillary 102 (*) 70 - 99 mg/dL  GLUCOSE, CAPILLARY     Status: Abnormal   Collection Time    08/24/12  8:45 PM      Result Value Range   Glucose-Capillary 115 (*) 70 - 99 mg/dL  CREATININE, SERUM     Status: Abnormal   Collection Time    08/25/12  5:20 AM      Result Value Range   Creatinine, Ser 3.29 (*) 0.50 - 1.35 mg/dL   GFR calc non Af Amer 16 (*) >90 mL/min   GFR calc Af Amer 19 (*) >90 mL/min   Comment:            The eGFR has been calculated      using the CKD EPI equation.     This calculation has not been     validated in all clinical     situations.     eGFR's persistently     <90 mL/min signify     possible Chronic Kidney Disease.      Constitutional: He appears well-developed and well-nourished.  Elderly, frail appearing male  HENT:  Head: Normocephalic and atraumatic.  Eyes: Conjunctivae and EOM are normal. Pupils are equal, round, and reactive to light.  Neck: Normal range of motion. Neck supple.  Cardiovascular: Normal rate and regular rhythm. No wheezes or rales  Pulmonary/Chest: Effort normal. No respiratory distress.  No cough.  Abdominal: Soft. Bowel sounds are normal. He exhibits no distension. There is no tenderness.  Musculoskeletal: He exhibits no edema and no tenderness.  Bilateral knees with large psoriatic patches. No pain with ROM.  Neurological: He is alert.  Oriented to self and place, reason he's here.  RUE 4/5. LUE is 3+/5. LLE is 2+ to 3/5 prox to 4- at ankle. RLE is 3/5 prox to 4/5 distally. Sensation grossly intact in all 4. Speech dysarthric. But no focal CN findings. Follows 2step commands and answered biographical questions. Has fair insight and awareness. Cooperates with the entirety of my exam.  Skin: Skin is warm and dry.   Assessment/Plan: 1. Functional deficits secondary to Deconditioning following pneumonia, bacteremia, respiratory failure which require 3+ hours per day of interdisciplinary therapy in a comprehensive inpatient rehab setting. Physiatrist is providing close team supervision and 24 hour management of active medical problems listed below. Physiatrist and rehab team continue to assess barriers to discharge/monitor patient progress toward functional and medical goals.  FIM: FIM - Bathing Bathing Steps Patient Completed: Chest;Right Arm;Left Arm;Abdomen;Left upper leg;Right upper leg;Buttocks;Front perineal area;Right lower leg (including foot);Left lower leg (  including  foot) Bathing: 5: Supervision: Safety issues/verbal cues  FIM - Upper Body Dressing/Undressing Upper body dressing/undressing steps patient completed: Thread/unthread right sleeve of pullover shirt/dresss;Thread/unthread left sleeve of pullover shirt/dress;Put head through opening of pull over shirt/dress;Pull shirt over trunk Upper body dressing/undressing: 5: Set-up assist to: Obtain clothing/put away FIM - Lower Body Dressing/Undressing Lower body dressing/undressing steps patient completed: Thread/unthread left pants leg;Thread/unthread right pants leg;Pull pants up/down;Don/Doff right sock;Don/Doff left sock;Fasten/unfasten pants;Don/Doff right shoe;Don/Doff left shoe;Fasten/unfasten right shoe;Fasten/unfasten left shoe Lower body dressing/undressing: 5: Set-up assist to: Obtain clothing  FIM - Toileting Toileting steps completed by patient: Adjust clothing prior to toileting;Performs perineal hygiene;Adjust clothing after toileting Toileting Assistive Devices: Grab bar or rail for support Toileting: 5: Supervision: Safety issues/verbal cues  FIM - Diplomatic Services operational officer Devices: Art gallery manager Transfers: 5-To toilet/BSC: Supervision (verbal cues/safety issues);5-From toilet/BSC: Supervision (verbal cues/safety issues)  FIM - Press photographer Assistive Devices: Arm rests;Walker Bed/Chair Transfer: 5: Bed > Chair or W/C: Supervision (verbal cues/safety issues);5: Chair or W/C > Bed: Supervision (verbal cues/safety issues)  FIM - Locomotion: Wheelchair Distance: 100 Locomotion: Wheelchair: 0: Activity did not occur FIM - Locomotion: Ambulation Locomotion: Ambulation Assistive Devices: Designer, industrial/product Ambulation/Gait Assistance: 5: Supervision Locomotion: Ambulation: 2: Travels 50 - 149 ft with supervision/safety issues  Comprehension Comprehension Mode: Auditory Comprehension: 6-Follows complex conversation/direction: With extra  time/assistive device  Expression Expression Mode: Verbal Expression: 6-Expresses complex ideas: With extra time/assistive device  Social Interaction Social Interaction: 7-Interacts appropriately with others - No medications needed.  Problem Solving Problem Solving: 5-Solves basic problems: With no assist  Memory Memory: 6-More than reasonable amt of time :   Medical Problem List and Plan:  1. DVT Prophylaxis/Anticoagulation: Pharmaceutical: Lovenox  2. Pain Management: N/A  3. Mood: Pleasant and appropriate today. Will have LCSW follow for evaluation.  4. Neuropsych: This patient is capable of making decisions on his own behalf.  5. CKD: Stage IV. Does not want dialysis. Will need to monitor for fluid overload and/or signs of uremia. Off Lasix due to acute on chronic renal failure. Resume calcitriol.  6. CHF: Will check daily weights. Add low salt restrictions. Monitor for signs of overload--likely exacerbated by CKD. Resume lasix if indicated.  7. Diarrhea: Probiotic added. Will add a dose of questran tonight to see if this helps.  8. COPD: Will continue spirivia. Will order rescue inhaler for use prn. Continue oxygen at nights.  9. DM type 2 diet controlled: Will monitor with ac/hs cbg checks. Has been hypoglycemic in am. Add HS snack.  10 Thrombocytopenia: monitor lovenox.  11. H/o prostate cancer: Reports worsening of frequency/urgency. Off Hytrin currently secondary to low BPs. Will check PVRs. Toilet while awake.  12. Psoriasis: Resume lidex to psoriatic lesions. Will add Eucerin cream additionally to dry areas.  LOS (Days) 7 A FACE TO FACE EVALUATION WAS PERFORMED  KIRSTEINS,ANDREW E 08/25/2012, 8:28 AM

## 2012-08-26 ENCOUNTER — Inpatient Hospital Stay (HOSPITAL_COMMUNITY): Payer: Medicare Other | Admitting: Speech Pathology

## 2012-08-26 ENCOUNTER — Inpatient Hospital Stay (HOSPITAL_COMMUNITY): Payer: Medicare Other | Admitting: *Deleted

## 2012-08-26 ENCOUNTER — Inpatient Hospital Stay (HOSPITAL_COMMUNITY): Payer: Medicare Other | Admitting: Occupational Therapy

## 2012-08-26 ENCOUNTER — Inpatient Hospital Stay (HOSPITAL_COMMUNITY): Payer: Medicare Other

## 2012-08-26 DIAGNOSIS — R5381 Other malaise: Secondary | ICD-10-CM

## 2012-08-26 DIAGNOSIS — A4189 Other specified sepsis: Secondary | ICD-10-CM

## 2012-08-26 LAB — GLUCOSE, CAPILLARY
Glucose-Capillary: 77 mg/dL (ref 70–99)
Glucose-Capillary: 105 mg/dL — ABNORMAL HIGH (ref 70–99)

## 2012-08-26 NOTE — Progress Notes (Signed)
Patient refuses CPAP. He knows to let us know if he changes his mind. RT will monitor.

## 2012-08-26 NOTE — Progress Notes (Signed)
Hypoglycemic Event  CBG: 66  Treatment: 15 GM carbohydrate snack  Symptoms: None  Follow-up CBG: Time:1203 CBG Result:97  Possible Reasons for Event: Unknown  Comments/MD notified:60 cc cranberry juice given and patient now eating lunch     Brad Jones  Remember to initiate Hypoglycemia Order Set & complete

## 2012-08-26 NOTE — Progress Notes (Signed)
Occupational Therapy Weekly Progress Note  Patient Details  Name: Brad Jones MRN: [id number] Date of Birth: 04/12/1927  Today's Date: 08/26/2012    Patient has met 4 of 4 short term goals.  Pt has been progressing well and is ready for discharge to home soon.  Patient continues to demonstrate the following deficits: decreased standing balance and therefore will continue to benefit from skilled OT intervention to enhance overall performance with BADL.    Patient progressing toward long term goals..  Continue plan of care.  OT Short Term Goals Week 1:  OT Short Term Goal 1 (Week 1): Pt will peform all bathing sit to stand with supervision. OT Short Term Goal 1 - Progress (Week 1): Met OT Short Term Goal 2 (Week 1): Pt will perform dressing sit to stand with supervision. OT Short Term Goal 2 - Progress (Week 1): Met OT Short Term Goal 3 (Week 1): Pt will peform shower transfers with supervision using RW.  OT Short Term Goal 3 - Progress (Week 1): Met OT Short Term Goal 4 (Week 1): Pt will perform toilet transfers with supervision using 3:1 and elevated toilet and RW.   OT Short Term Goal 4 - Progress (Week 1): Met Week 2:  OT Short Term Goal 1 (Week 2): STGs = LTGs  Skilled Therapeutic Interventions/Progress Updates: Continue OT 1-2x a day for 50-60 min 5-7 days a week for Balance/vestibular training;Discharge planning;DME/adaptive equipment instruction;Patient/family education;Neuromuscular re-education;Self Care/advanced ADL retraining;Therapeutic Exercise;UE/LE Strength taining/ROM;Therapeutic Activities;Functional mobility training  to maximize his independence with ADLs.   Therapy Documentation Precautions:  Precautions Precautions: Fall Precaution Comments: O2 dependent 2-3  liters Restrictions Weight Bearing Restrictions: No    ADL:  See FIM for current functional status  Therapy/Group: Individual Therapy  [name],[name] 08/26/2012, 8:37 AM

## 2012-08-26 NOTE — Progress Notes (Signed)
Social Work Patient ID: Brad Jones, male   DOB: 15-Mar-1927, 79 y.o.   MRN: [id number] Spoke with MD he reports pt is medically ready for discharge Thurs.  Will move forward on discharge for Thurs. Work on discharge needs, pt very pleased with the plan.

## 2012-08-26 NOTE — Progress Notes (Signed)
Physical Therapy Weekly Progress Note  Patient Details  Name: Brad Jones MRN: [id number] Date of Birth: [birth year]  Today's Date: 08/26/2012 Time: 9:32-10:32 ( )   Patient has met 8 of 8 long term goals.  Short term goals not set due to estimated length of stay.    Patient continues to demonstrate the following deficits: decreased balance, LE strength, and decreased cardiorespiratory support and therefore will continue to benefit from skilled PT intervention to enhance overall performance with activity tolerance and balance.   Continue plan of care until DC this Thursday, with remaining focus on family training, and continued safety with mobility.   Skilled Therapeutic Interventions/Progress Updates:  Tx focused on home setting navigation, dynamic balance, gait and stairs, as well as LE strengthening. 02 sats decreased on 3L with activity, so increased to 4L during therapy, reduced back to 2.5 in room.   Pt propelled WC x150' with S in controlled environment for increased activity tolerance.  WC>bed with S with only efficiency cue and bed>WC Mod I with safe hand use and steady technique.  Sit<>supine Mod I with increased time only.  Bed<>BSC with mod I.  Reviewed fall precautions in home and safe environment set-up.   Gait training in home environment x50' with RW and S, demonstrating safety with tight turns and navigating narrow spaces.   Gait in controlled environment x150' with RW and S only with cues for upright posture and to reduce speed.   Stairs x5 with bil rails and S only with step-to descending.   Standing dynamic balance with S only and good excursion for reaching outside BOS and across midline in all directions.   Nustep x73min level 5 with bil UE and LE for generalized strengthening and activity tolerance.      Therapy Documentation Precautions:  Precautions Precautions: Fall Precaution Comments: O2 dependent 2-3  liters Restrictions Weight Bearing  Restrictions: No  Pain: None    Mobility: Bed Mobility Supine to Sit: 6: Modified independent (Device/Increase time) Sit to Supine: 6: Modified independent (Device/Increase time) Locomotion : Ambulation Ambulation/Gait Assistance: 5: Supervision Wheelchair Mobility Distance: 150   See FIM for current functional status  Therapy/Group: Individual Therapy  Clydene Laming, PT, DPT  08/26/2012, 10:24 AM

## 2012-08-26 NOTE — Progress Notes (Signed)
Speech Language Pathology Daily Session Note  Patient Details  Name: Brad Jones MRN: [id number] Date of Birth: November 16, 1927  Today's Date: 08/26/2012 Time: 1435-1500 Time Calculation (min): 25 min  Short Term Goals: Week 1: SLP Short Term Goal 1 (Week 1): Pt will utilize memory compensatory strategies to recall new, daily information with supervision verbal and question cues.  SLP Short Term Goal 2 (Week 1): Pt will demonstrate functional problem solving for basic and familiar tasks with Mod I.   Skilled Therapeutic Interventions: Skilled treatment session focused on addressing cognition goals.  Spouse and daughter present for session and SLP facilitated session by completing education.  Patient independently recalled that discharge date was moved to Thursday.  Family advised that patient requires overall Supervision with medication and financial management tasks and that patient has been using a medication box to self-check when meds are taken.  Family and patient and family verbalized understanding of information.    FIM:  Comprehension Comprehension Mode: Auditory Comprehension: 5-Follows basic conversation/direction: With no assist Expression Expression Mode: Verbal Expression: 6-Expresses complex ideas: With extra time/assistive device Social Interaction Social Interaction: 7-Interacts appropriately with others - No medications needed. Problem Solving Problem Solving: 5-Solves complex 90% of the time/cues < 10% of the time Memory Memory: 6-More than reasonable amt of time  Pain Pain Assessment Pain Assessment: No/denies pain  Therapy/Group: Individual Therapy  Charlane Ferretti., CCC-SLP [phone]  Erastus Bartolomei 08/26/2012, 4:24 PM

## 2012-08-26 NOTE — Progress Notes (Signed)
Occupational Therapy Session Note  Patient Details  Name: Brad Jones MRN: [id number] Date of Birth: 1927-09-08  Today's Date: 08/26/2012 Time: 0805-0905 Time Calculation (min): 60 min  Short Term Goals: Week 1:  OT Short Term Goal 1 (Week 1): Pt will peform all bathing sit to stand with supervision. OT Short Term Goal 1 - Progress (Week 1): Met OT Short Term Goal 2 (Week 1): Pt will perform dressing sit to stand with supervision. OT Short Term Goal 2 - Progress (Week 1): Met OT Short Term Goal 3 (Week 1): Pt will peform shower transfers with supervision using RW.  OT Short Term Goal 3 - Progress (Week 1): Met OT Short Term Goal 4 (Week 1): Pt will perform toilet transfers with supervision using 3:1 and elevated toilet and RW.   OT Short Term Goal 4 - Progress (Week 1): Met   OT Short Term Goal 1 (Week 2): STGs = LTGs      Skilled Therapeutic Interventions/Progress Updates:    Pt seen for BADL retraining of toileting, bathing, and dressing with a focus on activity tolerance, functional mobilty with RW, and balance.  Pt stated that he used to remove oxygen prior to going to the bathroom at home.  Today he removed O2 and ambulated to toilet. After 5 minutes, O2 levels checked and sats were only 74%. O2 reapplied and levels resumed to 96% within 5 minutes. Advised pt to always have oxygen on at all times at home. Mod I with toileting, bathing; mod I UB dressing, setup/ supervision LB dressing and supervision with ambulation into bathroom. He is able to step over a tub wall with supervision to sit on his tub seat and can transfer bed to [facility] with mod I. He generally needs supervision with ambulation due to management of O2 tubing.  Therapy Documentation Precautions:  Precautions Precautions: Fall Precaution Comments: O2 dependent 2-3  liters Restrictions Weight Bearing Restrictions: No    Vital Signs: Therapy Vitals Pulse Rate: 72 BP: 140/78 mmHg Oxygen Therapy SpO2: 95 % O2 Device:  Nasal cannula O2 Flow Rate (L/min): 2 L/min Pain:  No c/o pain. ADL:  See FIM for current functional status  Therapy/Group: Individual Therapy  [name],[name] 08/26/2012, 10:39 AM

## 2012-08-26 NOTE — Progress Notes (Addendum)
Occupational Therapy Discharge Summary  Patient Details  Name: Brad Jones MRN: [id number] Date of Birth: [birth year]  Today's Date: 08/27/2012  Patient has met 10 of 10 long term goals due to improved activity tolerance, improved balance, postural control and ability to compensate for deficits.  Patient to discharge at overall Supervision level.  Patient's care partner is independent to provide the necessary physical and cognitive assistance at discharge.    Reasons goals not met: n/a  Recommendation:  No further OT services required.  Equipment: No equipment provided  Reasons for discharge: treatment goals met  Patient/family agrees with progress made and goals achieved: Yes  OT Discharge Precautions/Restrictions  Precautions Precautions: Fall Precaution Comments: O2 dependent 2-3  liters  ADL  supervision to mod I overall (refer to FIM) Vision/Perception  Vision - History Baseline Vision: Wears glasses only for reading Visual History: Macular degeneration Patient Visual Report: Blurring of vision Vision - Assessment Eye Alignment: Within Functional Limits Tracking/Visual Pursuits: Able to track stimulus in all quads without difficulty Perception Perception: Within Functional Limits Praxis Praxis: Intact  Cognition Overall Cognitive Status: Within Functional Limits for tasks assessed Arousal/Alertness: Awake/alert Orientation Level: Oriented X4 Sensation Sensation Light Touch: Appears Intact Stereognosis: Appears Intact Hot/Cold: Appears Intact Proprioception: Appears Intact Coordination Fine Motor Movements are Fluid and Coordinated: Yes Motor  Motor Motor - Discharge Observations: Decreased muscular endurance Mobility  Bed Mobility Supine to Sit: 6: Modified independent (Device/Increase time) Sit to Supine: 6: Modified independent (Device/Increase time)  Trunk/Postural Assessment  Cervical Strength Overall Cervical Strength Comments: Pt with cervical  flexion in sitting and standing.  Pt unable to achieve neutral cervical extension. Thoracic Assessment Thoracic Assessment: Within Functional Limits Lumbar Strength Overall Lumbar Strength Comments: Pt with lumbar kyphosis Postural Control Postural Control: Within Functional Limits  Balance Static Sitting Balance Static Sitting - Level of Assistance: 7: Independent Static Standing Balance Static Standing - Level of Assistance: 6: Modified independent (Device/Increase time) Dynamic Standing Balance Dynamic Standing - Level of Assistance: 5: Stand by assistance Extremity/Trunk Assessment RUE Assessment RUE Assessment: Within Functional Limits LUE Assessment LUE Assessment: Within Functional Limits  See FIM for current functional status  [name],[name] 08/26/2012, 10:44 AM  [name] 08/27/2012 10:32 AM

## 2012-08-26 NOTE — Progress Notes (Signed)
Occupational Therapy Session Note  Patient Details  Name: Brad Jones MRN: [id number] Date of Birth: 08-09-1927  Today's Date: 08/26/2012 Time: 4098-1191 Time Calculation (min): 45 min  Short Term Goals: Week 2:  OT Short Term Goal 1 (Week 2): STGs = LTGs  Skilled Therapeutic Interventions/Progress Updates:    Therapy session focused on sit<>stand, dynamic standing balance, activity tolerance, and functional ambulation. Pt received sitting in w/c and ambulated from room to therapy gym with supervision using RW. Pt on 2L O2 throughout therapy session. Engaged in sit<>stand activity while reaching out of BOS with 3 rest breaks and no lob. Pt engaged in dynamic standing balance activity while engaging in BUE exercises using weighted ball with 4 rest breaks. Engaged in dynamic standing balance activity of tossing ball and catching to challenge balance and reaction time. Pt able to engage without using RW for support and correct balance. Pt took short rest break after activity then ambulated back to room with supervision. Pt left with call light and all needs in reach and quick release belt donned.   Therapy Documentation Precautions:  Precautions Precautions: Fall Precaution Comments: O2 dependent 2-3  liters Restrictions Weight Bearing Restrictions: No General:   Vital Signs:   Pain: No c/o pain during therapy session.   See FIM for current functional status  Therapy/Group: Individual Therapy  Daneil Dan 08/26/2012, 2:54 PM

## 2012-08-26 NOTE — Progress Notes (Signed)
Patient ID: Brad Jones, male   DOB: 04/18/1927, 79 y.o.   MRN: [id number] Subjective/Complaints: 79 y.o. male with history of HTN, DM, COPD, macular degeneration, CKD (does not want dialysis); admitted on 08/10/12 with h/o malaise, syncope leading to unresponsiveness in setting of severe CAP and acute on chronic renal failure. Patient intubated at [facility] and transferred to [facility] for treatment. He was treated with IV antibiotics for sepsis due to strep bacteremia. CT head with global atrophy with right frontal lobe infarct of indeterminate age and moderate SVD. CT chest with multifocal PNA with consolidation concerning for aspiration. 2D echo with EF 50-55% with grade 1 diastolic dysfunction and severe pulmonary HTN. Extubated on 07/17 and encephalopathy due to sepsis resolving. Fluid overload treated with diuresis  Asking about D/C date again, forgot our conversation from yesterday  Review of Systems  Constitutional: Positive for malaise/fatigue.  All other systems reviewed and are negative.     Objective: Vital Signs: Blood pressure 150/83, pulse 76, temperature 97.8 F (36.6 C), temperature source Oral, resp. rate 19, height 5\' 8"  (1.727 m), weight 83.1 kg (183 lb 3.2 oz), SpO2 93.00%. No results found. Results for orders placed during the hospital encounter of 08/18/12 (from the past 72 hour(s))  GLUCOSE, CAPILLARY     Status: Abnormal   Collection Time    08/23/12 11:51 AM      Result Value Range   Glucose-Capillary 109 (*) 70 - 99 mg/dL   Comment 1 Notify RN    GLUCOSE, CAPILLARY     Status: None   Collection Time    08/23/12  4:46 PM      Result Value Range   Glucose-Capillary 94  70 - 99 mg/dL   Comment 1 Notify RN    GLUCOSE, CAPILLARY     Status: None   Collection Time    08/23/12  9:15 PM      Result Value Range   Glucose-Capillary 94  70 - 99 mg/dL  GLUCOSE, CAPILLARY     Status: None   Collection Time    08/24/12  7:39 AM      Result Value Range   Glucose-Capillary 96   70 - 99 mg/dL   Comment 1 Documented in Chart    GLUCOSE, CAPILLARY     Status: Abnormal   Collection Time    08/24/12  8:03 AM      Result Value Range   Glucose-Capillary 112 (*) 70 - 99 mg/dL  GLUCOSE, CAPILLARY     Status: None   Collection Time    08/24/12 11:40 AM      Result Value Range   Glucose-Capillary 94  70 - 99 mg/dL  GLUCOSE, CAPILLARY     Status: Abnormal   Collection Time    08/24/12  4:32 PM      Result Value Range   Glucose-Capillary 102 (*) 70 - 99 mg/dL  GLUCOSE, CAPILLARY     Status: Abnormal   Collection Time    08/24/12  8:45 PM      Result Value Range   Glucose-Capillary 115 (*) 70 - 99 mg/dL  CREATININE, SERUM     Status: Abnormal   Collection Time    08/25/12  5:20 AM      Result Value Range   Creatinine, Ser 3.29 (*) 0.50 - 1.35 mg/dL   GFR calc non Af Amer 16 (*) >90 mL/min   GFR calc Af Amer 19 (*) >90 mL/min   Comment:  The eGFR has been calculated     using the CKD EPI equation.     This calculation has not been     validated in all clinical     situations.     eGFR's persistently     <90 mL/min signify     possible Chronic Kidney Disease.  GLUCOSE, CAPILLARY     Status: None   Collection Time    08/25/12  7:36 AM      Result Value Range   Glucose-Capillary 92  70 - 99 mg/dL  GLUCOSE, CAPILLARY     Status: None   Collection Time    08/25/12 12:06 PM      Result Value Range   Glucose-Capillary 73  70 - 99 mg/dL  GLUCOSE, CAPILLARY     Status: None   Collection Time    08/25/12  4:46 PM      Result Value Range   Glucose-Capillary 83  70 - 99 mg/dL  GLUCOSE, CAPILLARY     Status: None   Collection Time    08/25/12  9:15 PM      Result Value Range   Glucose-Capillary 92  70 - 99 mg/dL      Constitutional: He appears well-developed and well-nourished.  Elderly, frail appearing male  HENT:  Head: Normocephalic and atraumatic.  Eyes: Conjunctivae and EOM are normal. Pupils are equal, round, and reactive to light.   Neck: Normal range of motion. Neck supple.  Cardiovascular: Normal rate and regular rhythm. No wheezes or rales  Pulmonary/Chest: Effort normal. No respiratory distress.  No cough.  Abdominal: Soft. Bowel sounds are normal. He exhibits no distension. There is no tenderness.  Musculoskeletal: He exhibits no edema and no tenderness.  Bilateral knees with large psoriatic patches. No pain with ROM.  Neurological: He is alert.  Oriented to self and place, reason he's here.  RUE 4/5. LUE is 3+/5. LLE is 2+ to 3/5 prox to 4- at ankle. RLE is 3/5 prox to 4/5 distally. Sensation grossly intact in all 4. Speech dysarthric. But no focal CN findings. Follows 2step commands and answered biographical questions. Has fair insight and awareness. Cooperates with the entirety of my exam.  Skin: Skin is warm and dry.   Assessment/Plan: 1. Functional deficits secondary to Deconditioning following pneumonia, bacteremia, respiratory failure which require 3+ hours per day of interdisciplinary therapy in a comprehensive inpatient rehab setting. Physiatrist is providing close team supervision and 24 hour management of active medical problems listed below. Physiatrist and rehab team continue to assess barriers to discharge/monitor patient progress toward functional and medical goals.  Tent D/C 8/2  FIM: FIM - Bathing Bathing Steps Patient Completed: Chest;Right Arm;Left Arm;Abdomen;Left upper leg;Right upper leg;Buttocks;Front perineal area;Right lower leg (including foot);Left lower leg (including foot) Bathing: 6: More than reasonable amount of time  FIM - Upper Body Dressing/Undressing Upper body dressing/undressing steps patient completed: Thread/unthread right sleeve of pullover shirt/dresss;Thread/unthread left sleeve of pullover shirt/dress;Put head through opening of pull over shirt/dress;Pull shirt over trunk Upper body dressing/undressing: 6: More than reasonable amount of time FIM - Lower Body  Dressing/Undressing Lower body dressing/undressing steps patient completed: Thread/unthread left pants leg;Thread/unthread right pants leg;Pull pants up/down;Don/Doff right sock;Don/Doff left sock;Fasten/unfasten pants;Don/Doff right shoe;Don/Doff left shoe;Fasten/unfasten right shoe;Fasten/unfasten left shoe Lower body dressing/undressing: 5: Supervision: Safety issues/verbal cues  FIM - Toileting Toileting steps completed by patient: Adjust clothing prior to toileting;Performs perineal hygiene;Adjust clothing after toileting Toileting Assistive Devices: Grab bar or rail for support Toileting: 6: More than  reasonable amount of time  FIM - Diplomatic Services operational officer Devices: Art gallery manager Transfers: 5-To toilet/BSC: Supervision (verbal cues/safety issues);5-From toilet/BSC: Supervision (verbal cues/safety issues)  FIM - Press photographer Assistive Devices: Arm rests;Walker Bed/Chair Transfer: 5: Bed > Chair or W/C: Supervision (verbal cues/safety issues);5: Chair or W/C > Bed: Supervision (verbal cues/safety issues)  FIM - Locomotion: Wheelchair Distance: 100 Locomotion: Wheelchair: 2: Travels 50 - 149 ft with supervision, cueing or coaxing FIM - Locomotion: Ambulation Locomotion: Ambulation Assistive Devices: Designer, industrial/product Ambulation/Gait Assistance: 5: Supervision Locomotion: Ambulation: 5: Travels 150 ft or more with supervision/safety issues  Comprehension Comprehension Mode: Auditory Comprehension: 6-Follows complex conversation/direction: With extra time/assistive device  Expression Expression Mode: Verbal Expression: 6-Expresses complex ideas: With extra time/assistive device  Social Interaction Social Interaction: 7-Interacts appropriately with others - No medications needed.  Problem Solving Problem Solving: 5-Solves complex 90% of the time/cues < 10% of the time  Memory Memory: 6-More than reasonable amt of time :   Medical  Problem List and Plan:  1. DVT Prophylaxis/Anticoagulation: Pharmaceutical: Lovenox  2. Pain Management: N/A  3. Mood: Pleasant and appropriate today. Will have LCSW follow for evaluation.  4. Neuropsych: This patient is capable of making decisions on his own behalf.  5. CKD: Stage IV. Does not want dialysis. Will need to monitor for fluid overload and/or signs of uremia. Off Lasix due to acute on chronic renal failure. Resume calcitriol.  6. CHF: Will check daily weights. Add low salt restrictions. Monitor for signs of overload--likely exacerbated by CKD. Resume lasix if indicated.  7. Diarrhea: Probiotic added. improved 8. COPD: Will continue spirivia. Will order rescue inhaler for use prn. Continue oxygen at nights.  9. DM type 2 diet controlled: Will monitor with ac/hs cbg checks. Has been hypoglycemic in am. Add HS snack.  10 Thrombocytopenia: monitor lovenox.  11. H/o prostate cancer: Reports worsening of frequency/urgency. Off Hytrin currently secondary to low BPs. Will check PVRs. Toilet while awake.  12. Psoriasis: Resume lidex to psoriatic lesions. Will add Eucerin cream additionally to dry areas.  LOS (Days) 8 A FACE TO FACE EVALUATION WAS PERFORMED  KIRSTEINS,ANDREW E 08/26/2012, 8:06 AM

## 2012-08-27 ENCOUNTER — Inpatient Hospital Stay (HOSPITAL_COMMUNITY): Payer: Medicare Other

## 2012-08-27 ENCOUNTER — Inpatient Hospital Stay (HOSPITAL_COMMUNITY): Payer: Medicare Other | Admitting: Occupational Therapy

## 2012-08-27 ENCOUNTER — Inpatient Hospital Stay (HOSPITAL_COMMUNITY): Payer: Medicare Other | Admitting: Speech Pathology

## 2012-08-27 LAB — GLUCOSE, CAPILLARY
Glucose-Capillary: 81 mg/dL (ref 70–99)
Glucose-Capillary: 62 mg/dL — ABNORMAL LOW (ref 70–99)
Glucose-Capillary: 72 mg/dL (ref 70–99)
Glucose-Capillary: 94 mg/dL (ref 70–99)

## 2012-08-27 NOTE — Progress Notes (Signed)
Hypoglycemic Event  CBG: 65  Treatment: 15 GM carbohydrate snack  Symptoms: None  Follow-up CBG: Time:1215 CBG Result:62  Possible Reasons for Event: Unknown  Comments/MD notified:P. Love PA notified     [name]  Remember to initiate Hypoglycemia Order Set & complete

## 2012-08-27 NOTE — Progress Notes (Signed)
Social Work Patient ID: Brad Jones, male   DOB: [birth year], 79 y.o.   MRN: [id number] Brad Chris, LCSW Social Worker Signed  Patient Care Conference Service date: 08/27/2012 12:18 PM  Inpatient RehabilitationTeam Conference and Plan of Care Update Date: 08/27/2012   Time: 10;30 AM     Patient Name: Brad Jones       Medical Record Number: [id number]   Date of Birth: February 09, 1927 Sex: Male         Room/Bed: 4W11C/4W11C-01 Payor Info: Payor: MEDICARE / Plan: MEDICARE PART A AND B / Product Type: *No Product type* /   Admitting Diagnosis: Sepsis  PNA   Admit Date/Time:  08/18/2012  5:02 PM Admission Comments: No comment available   Primary Diagnosis:  <principal problem not specified> Principal Problem: <principal problem not specified>    Patient Active Problem List     Diagnosis  Date Noted   .  Diarrhea  08/21/2012   .  OSA (obstructive sleep apnea)--non compliant with CPAP  08/21/2012   .  Physical deconditioning  08/19/2012   .  Chronic kidney disease (CKD), stage IV (severe)  08/19/2012   .  COPD (chronic obstructive pulmonary disease)  08/19/2012   .  Thrombocytopenia, unspecified  08/19/2012   .  Macular degeneration  08/19/2012   .  Acute renal failure  08/10/2012   .  Septic shock(785.52)  08/10/2012   .  Acute respiratory failure with hypoxia  08/10/2012   .  Community acquired pneumonia  08/10/2012     Expected Discharge Date: Expected Discharge Date: 08/28/12  Team Members Present: Physician leading conference: Dr. Claudette Laws Social Worker Present: Staci Acosta, LCSW;Becky Letoya Stallone, LCSW Nurse Present: Carmie End, RN PT Present: Cyndia Skeeters, PT;Caroline Sonia Side, PT OT Present: Rosalio Loud, OT;Kris Jacklynn Lewis, OT;Kayla Perkinson, Heath Lark, OT SLP Present: Fae Pippin, SLP        Current Status/Progress  Goal  Weekly Team Focus   Medical     Patient participating in rehabilitation program. Appears more motivated. Anxious to go home  soon  Maximize functional recovery prior to discharge to home  Discharge planning   Bowel/Bladder     Patient is incontinent of bowel/bladder, LBM 08/26/12; pt wears condom catheter at HS  Pt will be continent with timed toileting  Continue to monitor and keep patient dry   Swallow/Nutrition/ Hydration     na       ADL's     mod I bathing, toileting, dressing, and BSC transfer from bed; supervision with transfers with RW  supervision to mod I (goals met- ready for discharge)  family education   Mobility     Supervision gait and transfers  Supervision gait and transfers  Family/pt ed, balance, activity tolerance   Communication     [facility]       Safety/Cognition/ Behavioral Observations    Supervision-ModI   Supervision   complete family education    Pain     no c/o pain  assess pain q4hrs and prn  montor   Skin     patient has psoriasis to blilateral knees, elbow, and back;dry skin to shins and feet eucerin cream applied; healing skin buttock area  keep pt clean dry/monitor skin  continue to monitor/no new skin breakdown    Rehab Goals Patient on target to meet rehab goals: Yes *See Care Plan and progress notes for long and short-term goals.    Barriers to Discharge:  Wife is able to provide supervision only  Possible Resolutions to Barriers:    Upgrade goals to supervision      Discharge Planning/Teaching Needs:    Home with wife and daughter's to assist-pleased with his progress and ready to go home tomorrow      Team Discussion:    Pt has reached his goals and ready for discharge-daughter to stay with few days for transition. Medically ready for d/c   Revisions to Treatment Plan:    Pt reached goals sooner and discharge date moved up to tomorrow    Continued Need for Acute Rehabilitation Level of Care: The patient requires daily medical management by a physician with specialized training in physical medicine and rehabilitation for the following conditions: Daily  direction of a multidisciplinary physical rehabilitation program to ensure safe treatment while eliciting the highest outcome that is of practical value to the patient.: Yes Daily medical management of patient stability for increased activity during participation in an intensive rehabilitation regime.: Yes Daily analysis of laboratory values and/or radiology reports with any subsequent need for medication adjustment of medical intervention for : Neurological problems  Caelyn Route, Lemar Livings 08/27/2012, 12:18 PM

## 2012-08-27 NOTE — Patient Care Conference (Signed)
Inpatient RehabilitationTeam Conference and Plan of Care Update Date: 08/27/2012   Time: 10;30 AM    Patient Name: Brad Jones      Medical Record Number: [id number]  Date of Birth: 05/20/1927 Sex: Male         Room/Bed: 4W11C/4W11C-01 Payor Info: Payor: MEDICARE / Plan: MEDICARE PART A AND B / Product Type: *No Product type* /    Admitting Diagnosis: Sepsis  PNA  Admit Date/Time:  08/18/2012  5:02 PM Admission Comments: No comment available   Primary Diagnosis:  <principal problem not specified> Principal Problem: <principal problem not specified>  Patient Active Problem List   Diagnosis Date Noted  . Diarrhea 08/21/2012  . OSA (obstructive sleep apnea)--non compliant with CPAP 08/21/2012  . Physical deconditioning 08/19/2012  . Chronic kidney disease (CKD), stage IV (severe) 08/19/2012  . COPD (chronic obstructive pulmonary disease) 08/19/2012  . Thrombocytopenia, unspecified 08/19/2012  . Macular degeneration 08/19/2012  . Acute renal failure 08/10/2012  . Septic shock(785.52) 08/10/2012  . Acute respiratory failure with hypoxia 08/10/2012  . Community acquired pneumonia 08/10/2012    Expected Discharge Date: Expected Discharge Date: 08/28/12  Team Members Present: Physician leading conference: Dr. Claudette Laws Social Worker Present: Staci Acosta, LCSW;Becky Eleaner Dibartolo, LCSW Nurse Present: Carmie End, RN PT Present: Cyndia Skeeters, PT;Caroline Sonia Side, PT OT Present: Rosalio Loud, OT;Kris Jacklynn Lewis, OT;Kayla Perkinson, Heath Lark, OT SLP Present: Fae Pippin, SLP     Current Status/Progress Goal Weekly Team Focus  Medical   Patient participating in rehabilitation program. Appears more motivated. Anxious to go home soon  Maximize functional recovery prior to discharge to home  Discharge planning   Bowel/Bladder   Patient is incontinent of bowel/bladder, LBM 08/26/12; pt wears condom catheter at HS  Pt will be continent with timed toileting  Continue  to monitor and keep patient dry   Swallow/Nutrition/ Hydration     na        ADL's   mod I bathing, toileting, dressing, and BSC transfer from bed; supervision with transfers with RW  supervision to mod I (goals met- ready for discharge)  family education   Mobility   Supervision gait and transfers  Supervision gait and transfers  Family/pt ed, balance, activity tolerance   Communication   [facility]         Safety/Cognition/ Behavioral Observations  Supervision-ModI   Supervision   complete family education    Pain   no c/o pain  assess pain q4hrs and prn  montor   Skin   patient has psoriasis to blilateral knees, elbow, and back;dry skin to shins and feet eucerin cream applied; healing skin buttock area  keep pt clean dry/monitor skin  continue to monitor/no new skin breakdown    Rehab Goals Patient on target to meet rehab goals: Yes *See Care Plan and progress notes for long and short-term goals.  Barriers to Discharge: Wife is able to provide supervision only    Possible Resolutions to Barriers:  Upgrade goals to supervision    Discharge Planning/Teaching Needs:  Home with wife and daughter's to assist-pleased with his progress and ready to go home tomorrow      Team Discussion:  Pt has reached his goals and ready for discharge-daughter to stay with few days for transition. Medically ready for d/c  Revisions to Treatment Plan:  Pt reached goals sooner and discharge date moved up to tomorrow   Continued Need for Acute Rehabilitation Level of Care: The patient requires daily medical management by a physician  with specialized training in physical medicine and rehabilitation for the following conditions: Daily direction of a multidisciplinary physical rehabilitation program to ensure safe treatment while eliciting the highest outcome that is of practical value to the patient.: Yes Daily medical management of patient stability for increased activity during participation in an  intensive rehabilitation regime.: Yes Daily analysis of laboratory values and/or radiology reports with any subsequent need for medication adjustment of medical intervention for : Neurological problems  Lamari Beckles, Lemar Livings 08/27/2012, 12:18 PM

## 2012-08-27 NOTE — Progress Notes (Signed)
Speech Language Pathology Daily Session Note  Patient Details  Name: Brad Jones MRN: [id number] Date of Birth: [birth year]  Today's Date: 08/27/2012 Time: 1335-1400 Time Calculation (min): 25 min  Short Term Goals: Week 1: SLP Short Term Goal 1 (Week 1): Pt will utilize memory compensatory strategies to recall new, daily information with supervision verbal and question cues.  SLP Short Term Goal 2 (Week 1): Pt will demonstrate functional problem solving for basic and familiar tasks with Mod I.   Skilled Therapeutic Interventions: Skilled treatment session focused on addressing cognition goals and completing patient education.  Patient required Supervision level verbal cues to complete daily math calculations.  SLP also discussed and provided patient with handout regarding recommended recall strategies for optional home management.   FIM:  Comprehension Comprehension Mode: Auditory Comprehension: 6-Follows complex conversation/direction: With extra time/assistive device Expression Expression Mode: Verbal Expression: 6-Expresses complex ideas: With extra time/assistive device Social Interaction Social Interaction: 7-Interacts appropriately with others - No medications needed. Problem Solving Problem Solving: 5-Solves complex 90% of the time/cues < 10% of the time Memory Memory: 6-More than reasonable amt of time FIM - Eating Eating Activity: 6: Assistive device: dentures  Pain Pain Assessment Pain Assessment: No/denies pain  Therapy/Group: Individual Therapy  Charlane Ferretti., CCC-SLP [phone]  Brad Jones 08/27/2012, 4:26 PM

## 2012-08-27 NOTE — Progress Notes (Signed)
Occupational Therapy Session Note  Patient Details  Name: Brad Jones MRN: [id number] Date of Birth: 12/31/1927  Today's Date: 08/27/2012 Time: 0800-0900 Time Calculation (min): 60 min  Short Term Goals: Week 2:  OT Short Term Goal 1 (Week 2): STGs = LTGs  Skilled Therapeutic Interventions/Progress Updates:  Self care retraining to include shower, dress and groom.  Focus session on patient preparing for discharge home tomorrow at an overall supervision level for BADL tasks, patient managing his O2 line during functional mobility and walker safety with all bathroom transfers.  Patient incontinent of bowel in brief and reports at home the cleanup would be easier because he uses "the kind with elastic that is easier".  Patient agreed to remain seated during shower and to let this clinician know when he was ready to stand to bathe his perineal area.  Patient did not alert this clinician and stood in shower.  He was not aware that this clinician was present.  When QRB was applied while patient in w/c, he stated, "I don't know why I have to use that belt, I won't get up by myself".  Reviewed the recommendation that at discharge, he should always have his wife stand by when he is on his feet.  Patient is overall supervision with BADL tasks.  Therapy Documentation Precautions:  Precautions Precautions: Fall Precaution Comments: O2 dependent 2-3  liters Restrictions Weight Bearing Restrictions: No Pain: Denies pain ADL: See FIM for current functional status  Therapy/Group: Individual Therapy  Dorothea Yow 08/27/2012, 8:55 AM

## 2012-08-27 NOTE — Progress Notes (Signed)
Social Work Patient ID: Brad Jones, male   DOB: Dec 14, 1927, 79 y.o.   MRN: [id number] Spoke with daughter via telephone to confirm discharge tomorrow and progression toward goals. Both she and pt are aware of team conference goals.  Both pleased with progress and discharge tomorrow. Daughter to bring his portable O2 tank from home to go home with.

## 2012-08-27 NOTE — Progress Notes (Addendum)
Physical Therapy Discharge Summary  Patient Details  Name: Brad Jones MRN: [id number] Date of Birth: 26-Mar-1927  Today's Date: 08/27/2012 Time: 0800-0900 and 1400-1445 Time Calculation (min): 60 min and 45 min  Patient has met 7 of 7 long term goals due to improved activity tolerance, improved balance and increased strength.  Patient to discharge at an ambulatory level Supervision household level.   Supervision is recommended due to pt's multiple medical problems; not for cognition.  Patient's care partner is not needed for training.  Reasons goals not met: n/a  Recommendation:  Patient will benefit from ongoing skilled PT services in home health setting to continue to advance safe functional mobility, address ongoing impairments in strength, balance, activity tolerance, and minimize fall risk.  Equipment: No equipment provided; pt owns RW, and does not desire rental w/c.  Reasons for discharge: treatment goals met and discharge from hospital  Patient/family agrees with progress made and goals achieved: Yes  PT Discharge  Treatment 1 today: gait training x 170' with RW, supervision, cues for upright posture, forward gaze.  Up/down 5 steps L rail ascending, supervision. Gait with RW on curb and ramp with supervision.  O2 sats 80% on 2L [place] after gait; increased to 3L, with sats slowly increasing to  98%.  Fall Prevention 1 exs, 2# on ankles, VCs for technique.  Treatment 2: gait as above , steps as above.  Fall Prevention 1 ex: bil hamstring curls in standing with bil UE support, 2# RLE, 0# LLE.   Patient demonstrates increased fall risk as noted by score of  34 /56 on Berg Balance Scale.  (<36= high risk for falls, close to 100%; 37-45 significant >80%; 46-51 moderate >50%; 52-55 lower >25%). Pt demonstrated poor balance reactions at ankle and hip.  W/c propulsion for activity tolerance x 160' with supervision.  Pt declined a rental w/c for home  use.  Precautions/Restrictions Precautions Precautions: Fall Precaution Comments: O2 dependent 2-3  liters Vital Signs Therapy Vitals Pulse Rate: 78 Patient Position, if appropriate: Sitting Oxygen Therapy SpO2: 98 % O2 Device: Nasal cannula O2 Flow Rate (L/min): 3 L/min (O2 sat was 80% on 2L with activity) Pain Pain Assessment Pain Assessment: No/denies pain    Cognition Orientation Level: Oriented X4 Sensation- appears intact   Motor -WFL; old  CVA /L hemi    Mobility- modified independent for bed mobility; S stand pivot transfer   Locomotion  Ambulation Ambulation: Yes Ambulation/Gait Assistance: 5: Supervision Ambulation Distance (Feet): 170 Feet Assistive device: Rolling walker Gait Gait: Yes Gait Pattern: Impaired Gait Pattern: Step-through pattern;Trunk flexed;Decreased trunk rotation;Lateral hip instability;Narrow base of support;Decreased dorsiflexion - left;Decreased hip/knee flexion - left Stairs / Additional Locomotion Stairs: Yes Stairs Assistance: 5: Supervision Number of Stairs: 5 Height of Stairs: 7 Ramp: 5: Supervision Curb: 5: Programme researcher, broadcasting/film/video: Yes Wheelchair Assistance: 5: Investment banker, operational: Both upper extremities Wheelchair Parts Management: Needs assistance Distance: 150  Trunk/Postural Assessment  Cervical Strength Overall Cervical Strength Comments: Pt with cervical flexion in sitting and standing.  Pt unable to achieve neutral cervical extension. Thoracic Assessment Thoracic Assessment: Within Functional Limits Lumbar Assessment Lumbar Assessment: Within Functional Limits Lumbar Strength Overall Lumbar Strength Comments: Pt with lumbar kyphosis Postural Control Postural Control: Within Functional Limits  Balance- Berg score 34/56   Extremity Assessment      RLE Assessment RLE Assessment: Within Functional Limits (grossly 5/5 except ankle DF 4+/5) LLE Assessment LLE  Assessment: Within Functional Limits (grossly 4+/5 except ankle DF 4/5 PF )  See FIM for current functional status  Sarann Tregre 08/27/2012, 10:33 AM

## 2012-08-27 NOTE — Progress Notes (Signed)
Hypoglycemic Event  CBG: 62 at 1212  Treatment: 15 GM carbohydrate snack eating lunch   Symptoms: None  Follow-up CBG: Time:1318 CBG Result:102  Possible Reasons for Event: Unknown  Comments/MD notified:P. Love , [place] notified     Brad Jones  Remember to initiate Hypoglycemia Order Set & complete

## 2012-08-27 NOTE — Progress Notes (Signed)
Patient ID: Brad Jones, male   DOB: 09-24-1927, 79 y.o.   MRN: [id number] Subjective/Complaints: 79 y.o. male with history of HTN, DM, COPD, macular degeneration, CKD (does not want dialysis); admitted on 08/10/12 with h/o malaise, syncope leading to unresponsiveness in setting of severe CAP and acute on chronic renal failure. Patient intubated at [facility] and transferred to [facility] for treatment. He was treated with IV antibiotics for sepsis due to strep bacteremia. CT head with global atrophy with right frontal lobe infarct of indeterminate age and moderate SVD. CT chest with multifocal PNA with consolidation concerning for aspiration. 2D echo with EF 50-55% with grade 1 diastolic dysfunction and severe pulmonary HTN. Extubated on 07/17 and encephalopathy due to sepsis resolving. Fluid overload treated with diuresis  Pleased about moving up discharge date 7/31  Review of Systems  Constitutional: Positive for malaise/fatigue.  All other systems reviewed and are negative.     Objective: Vital Signs: Blood pressure 137/76, pulse 67, temperature 97.8 F (36.6 C), temperature source Oral, resp. rate 18, height 5\' 8"  (1.727 m), weight 83.1 kg (183 lb 3.2 oz), SpO2 96.00%. No results found. Results for orders placed during the hospital encounter of 08/18/12 (from the past 72 hour(s))  GLUCOSE, CAPILLARY     Status: None   Collection Time    08/24/12 11:40 AM      Result Value Range   Glucose-Capillary 94  70 - 99 mg/dL  GLUCOSE, CAPILLARY     Status: Abnormal   Collection Time    08/24/12  4:32 PM      Result Value Range   Glucose-Capillary 102 (*) 70 - 99 mg/dL  GLUCOSE, CAPILLARY     Status: Abnormal   Collection Time    08/24/12  8:45 PM      Result Value Range   Glucose-Capillary 115 (*) 70 - 99 mg/dL  CREATININE, SERUM     Status: Abnormal   Collection Time    08/25/12  5:20 AM      Result Value Range   Creatinine, Ser 3.29 (*) 0.50 - 1.35 mg/dL   GFR calc non Af Amer 16 (*) >90 mL/min    GFR calc Af Amer 19 (*) >90 mL/min   Comment:            The eGFR has been calculated     using the CKD EPI equation.     This calculation has not been     validated in all clinical     situations.     eGFR's persistently     <90 mL/min signify     possible Chronic Kidney Disease.  GLUCOSE, CAPILLARY     Status: None   Collection Time    08/25/12  7:36 AM      Result Value Range   Glucose-Capillary 92  70 - 99 mg/dL  GLUCOSE, CAPILLARY     Status: None   Collection Time    08/25/12 12:06 PM      Result Value Range   Glucose-Capillary 73  70 - 99 mg/dL  GLUCOSE, CAPILLARY     Status: None   Collection Time    08/25/12  4:46 PM      Result Value Range   Glucose-Capillary 83  70 - 99 mg/dL  GLUCOSE, CAPILLARY     Status: None   Collection Time    08/25/12  9:15 PM      Result Value Range   Glucose-Capillary 92  70 - 99 mg/dL  GLUCOSE,  CAPILLARY     Status: None   Collection Time    08/26/12  7:30 AM      Result Value Range   Glucose-Capillary 77  70 - 99 mg/dL  GLUCOSE, CAPILLARY     Status: Abnormal   Collection Time    08/26/12 11:35 AM      Result Value Range   Glucose-Capillary 66 (*) 70 - 99 mg/dL  GLUCOSE, CAPILLARY     Status: None   Collection Time    08/26/12 12:03 PM      Result Value Range   Glucose-Capillary 97  70 - 99 mg/dL  GLUCOSE, CAPILLARY     Status: None   Collection Time    08/26/12  4:39 PM      Result Value Range   Glucose-Capillary 88  70 - 99 mg/dL   Comment 1 Notify RN    GLUCOSE, CAPILLARY     Status: Abnormal   Collection Time    08/26/12  8:17 PM      Result Value Range   Glucose-Capillary 105 (*) 70 - 99 mg/dL   Comment 1 Notify RN    GLUCOSE, CAPILLARY     Status: None   Collection Time    08/27/12  7:30 AM      Result Value Range   Glucose-Capillary 81  70 - 99 mg/dL   Comment 1 Notify RN        Constitutional: He appears well-developed and well-nourished.  Elderly, frail appearing male  HENT:  Head: Normocephalic  and atraumatic.  Eyes: Conjunctivae and EOM are normal. Pupils are equal, round, and reactive to light.  Neck: Normal range of motion. Neck supple.  Cardiovascular: Normal rate and regular rhythm. No wheezes or rales  Pulmonary/Chest: Effort normal. No respiratory distress.  No cough.  Abdominal: Soft. Bowel sounds are normal. He exhibits no distension. There is no tenderness.  Musculoskeletal: He exhibits no edema and no tenderness.  Bilateral knees with large psoriatic patches. No pain with ROM.  Neurological: He is alert.  Oriented to self and place, reason he's here.  RUE 4/5. LUE is 3+/5. LLE is 2+ to 3/5 prox to 4- at ankle. RLE is 3/5 prox to 4/5 distally. Sensation grossly intact in all 4. Speech dysarthric. But no focal CN findings. Follows 2step commands and answered biographical questions. Has fair insight and awareness. Cooperates with the entirety of my exam.  Skin: Skin is warm and dry.   Assessment/Plan: 1. Functional deficits secondary to Deconditioning following pneumonia, bacteremia, respiratory failure which require 3+ hours per day of interdisciplinary therapy in a comprehensive inpatient rehab setting. Physiatrist is providing close team supervision and 24 hour management of active medical problems listed below. Physiatrist and rehab team continue to assess barriers to discharge/monitor patient progress toward functional and medical goals.  Plan D/C 7/31 Team conference today please see physician documentation under team conference tab, met with team face-to-face to discuss problems,progress, and goals. Formulized individual treatment plan based on medical history, underlying problem and comorbidities.  FIM: FIM - Bathing Bathing Steps Patient Completed: Chest;Right Arm;Left Arm;Abdomen;Left upper leg;Right upper leg;Buttocks;Front perineal area;Right lower leg (including foot);Left lower leg (including foot) Bathing: 6: More than reasonable amount of time  FIM - Upper  Body Dressing/Undressing Upper body dressing/undressing steps patient completed: Thread/unthread right sleeve of pullover shirt/dresss;Thread/unthread left sleeve of pullover shirt/dress;Put head through opening of pull over shirt/dress;Pull shirt over trunk Upper body dressing/undressing: 6: More than reasonable amount of time FIM - Lower  Body Dressing/Undressing Lower body dressing/undressing steps patient completed: Thread/unthread left pants leg;Thread/unthread right pants leg;Pull pants up/down;Don/Doff right sock;Don/Doff left sock;Fasten/unfasten pants;Don/Doff right shoe;Don/Doff left shoe;Fasten/unfasten right shoe;Fasten/unfasten left shoe Lower body dressing/undressing: 5: Supervision: Safety issues/verbal cues  FIM - Toileting Toileting steps completed by patient: Adjust clothing prior to toileting;Performs perineal hygiene;Adjust clothing after toileting Toileting Assistive Devices: Grab bar or rail for support Toileting: 6: More than reasonable amount of time  FIM - Diplomatic Services operational officer Devices: Bedside commode Toilet Transfers: 6-To toilet/ BSC;6-From toilet/BSC  FIM - Banker Devices: Arm rests;Walker Bed/Chair Transfer: 6: Supine > Sit: No assist;6: Sit > Supine: No assist;5: Bed > Chair or W/C: Supervision (verbal cues/safety issues);6: Chair or W/C > Bed: No assist  FIM - Locomotion: Wheelchair Distance: 150 Locomotion: Wheelchair: 5: Travels 150 ft or more: maneuvers on rugs and over door sills with supervision, cueing or coaxing FIM - Locomotion: Ambulation Locomotion: Ambulation Assistive Devices: Designer, industrial/product Ambulation/Gait Assistance: 5: Supervision Locomotion: Ambulation: 5: Travels 150 ft or more with supervision/safety issues  Comprehension Comprehension Mode: Auditory Comprehension: 5-Follows basic conversation/direction: With no assist  Expression Expression Mode: Verbal Expression:  6-Expresses complex ideas: With extra time/assistive device  Social Interaction Social Interaction: 7-Interacts appropriately with others - No medications needed.  Problem Solving Problem Solving: 5-Solves basic 90% of the time/requires cueing < 10% of the time  Memory Memory: 6-More than reasonable amt of time :   Medical Problem List and Plan:  1. DVT Prophylaxis/Anticoagulation: Pharmaceutical: Lovenox  2. Pain Management: N/A  3. Mood: Pleasant and appropriate today. Will have LCSW follow for evaluation.  4. Neuropsych: This patient is capable of making decisions on his own behalf.  5. CKD: Stage IV. Does not want dialysis. Will need to monitor for fluid overload and/or signs of uremia. Off Lasix due to acute on chronic renal failure. Resume calcitriol.  6. CHF: Will check daily weights. Add low salt restrictions. Monitor for signs of overload--likely exacerbated by CKD. Resume lasix if indicated.  7. Diarrhea: Probiotic added. improved 8. COPD: Will continue spirivia. Will order rescue inhaler for use prn. Continue oxygen at nights.  9. DM type 2 diet controlled: Will monitor with ac/hs cbg checks. Has been hypoglycemic in am. Add HS snack.  10 Thrombocytopenia: monitor lovenox.  11. H/o prostate cancer: Reports worsening of frequency/urgency. Off Hytrin currently secondary to low BPs. Will check PVRs. Toilet while awake.  12. Psoriasis: Resume lidex to psoriatic lesions. Will add Eucerin cream additionally to dry areas.  LOS (Days) 9 A FACE TO FACE EVALUATION WAS PERFORMED  Ixel Boehning E 08/27/2012, 9:24 AM

## 2012-08-28 DIAGNOSIS — A4189 Other specified sepsis: Secondary | ICD-10-CM

## 2012-08-28 DIAGNOSIS — R5381 Other malaise: Secondary | ICD-10-CM

## 2012-08-28 LAB — GLUCOSE, CAPILLARY
Glucose-Capillary: 81 mg/dL (ref 70–99)
Glucose-Capillary: 118 mg/dL — ABNORMAL HIGH (ref 70–99)

## 2012-08-28 MED ORDER — METOPROLOL TARTRATE 50 MG PO TABS: 50.0000 mg | ORAL_TABLET | Freq: Two times a day (BID) | ORAL | Status: DC

## 2012-08-28 MED ORDER — CALCIUM POLYCARBOPHIL 625 MG PO TABS: 1250.0000 mg | ORAL_TABLET | Freq: Every day | ORAL | Status: AC

## 2012-08-28 NOTE — Progress Notes (Signed)
Speech Language Pathology Discharge Summary  Patient Details  Name: Brad Jones MRN: [id number] Date of Birth: 01-Oct-1927  Today's Date: 08/28/2012  Patient has met 2 of 2 long term goals.  Patient to discharge at overall Modified Independent;Supervision level.  Reasons goals not met: n/a   Clinical Impression/Discharge Summary: Patient met 2 out of 2 long term goals during his CIR stay due to gains in self-monitoring and correcting during problem solving and recall abilities.  Patient requires intermittent supervision with medication management and overall recall; however, with reminders to utilize strategies such as using external aids and double checking.  Patient and family education have been completed and as a result, no further skilled SLP services are warranted.    Care Partner:  Caregiver Able to Provide Assistance: Yes  Type of Caregiver Assistance: Cognitive  Recommendation:  24 hour supervision/assistance;None  Rationale for SLP Follow Up:  (n/a)   Equipment: none   Reasons for discharge: Treatment goals met;Discharged from hospital   Patient/Family Agrees with Progress Made and Goals Achieved: Yes   See FIM for current functional status  Charlane Ferretti., CCC-SLP [phone]  [name] 08/28/2012, 4:35 PM

## 2012-08-28 NOTE — Progress Notes (Signed)
Patient and family received verbal and written discharge instructions from Marissa Nestle, [place]. Aware of medications, restrictions and follow up appointments, deny question or concerns, personal belongings packed by family and staff. Patient taken down by Nt via wheelchair to private vehicle. [name], Brad Jones

## 2012-08-28 NOTE — Progress Notes (Signed)
Patient ID: Brad Jones, male   DOB: Jun 16, 1927, 79 y.o.   MRN: [id number] Subjective/Complaints: 79 y.o. male with history of HTN, DM, COPD, macular degeneration, CKD (does not want dialysis); admitted on 08/10/12 with h/o malaise, syncope leading to unresponsiveness in setting of severe CAP and acute on chronic renal failure. Patient intubated at [facility] and transferred to [facility] for treatment. He was treated with IV antibiotics for sepsis due to strep bacteremia. CT head with global atrophy with right frontal lobe infarct of indeterminate age and moderate SVD. CT chest with multifocal PNA with consolidation concerning for aspiration. 2D echo with EF 50-55% with grade 1 diastolic dysfunction and severe pulmonary HTN. Extubated on 07/17 and encephalopathy due to sepsis resolving. Fluid overload treated with diuresis  Excited to go home today. Feels he's ready!  Review of Systems  Constitutional: Positive for malaise/fatigue.  All other systems reviewed and are negative.     Objective: Vital Signs: Blood pressure 124/64, pulse 72, temperature 98.5 F (36.9 C), temperature source Oral, resp. rate 19, height 5\' 8"  (1.727 m), weight 83.008 kg (183 lb), SpO2 98.00%. No results found. Results for orders placed during the hospital encounter of 08/18/12 (from the past 72 hour(s))  GLUCOSE, CAPILLARY     Status: None   Collection Time    08/25/12 12:06 PM      Result Value Range   Glucose-Capillary 73  70 - 99 mg/dL  GLUCOSE, CAPILLARY     Status: None   Collection Time    08/25/12  4:46 PM      Result Value Range   Glucose-Capillary 83  70 - 99 mg/dL  GLUCOSE, CAPILLARY     Status: None   Collection Time    08/25/12  9:15 PM      Result Value Range   Glucose-Capillary 92  70 - 99 mg/dL  GLUCOSE, CAPILLARY     Status: None   Collection Time    08/26/12  7:30 AM      Result Value Range   Glucose-Capillary 77  70 - 99 mg/dL  GLUCOSE, CAPILLARY     Status: Abnormal   Collection Time     08/26/12 11:35 AM      Result Value Range   Glucose-Capillary 66 (*) 70 - 99 mg/dL  GLUCOSE, CAPILLARY     Status: None   Collection Time    08/26/12 12:03 PM      Result Value Range   Glucose-Capillary 97  70 - 99 mg/dL  GLUCOSE, CAPILLARY     Status: None   Collection Time    08/26/12  4:39 PM      Result Value Range   Glucose-Capillary 88  70 - 99 mg/dL   Comment 1 Notify RN    GLUCOSE, CAPILLARY     Status: Abnormal   Collection Time    08/26/12  8:17 PM      Result Value Range   Glucose-Capillary 105 (*) 70 - 99 mg/dL   Comment 1 Notify RN    GLUCOSE, CAPILLARY     Status: None   Collection Time    08/27/12  7:30 AM      Result Value Range   Glucose-Capillary 81  70 - 99 mg/dL   Comment 1 Notify RN    GLUCOSE, CAPILLARY     Status: Abnormal   Collection Time    08/27/12 11:40 AM      Result Value Range   Glucose-Capillary 65 (*) 70 - 99 mg/dL  Comment 1 Notify RN    GLUCOSE, CAPILLARY     Status: Abnormal   Collection Time    08/27/12 12:12 PM      Result Value Range   Glucose-Capillary 62 (*) 70 - 99 mg/dL   Comment 1 Notify RN    GLUCOSE, CAPILLARY     Status: Abnormal   Collection Time    08/27/12  1:18 PM      Result Value Range   Glucose-Capillary 102 (*) 70 - 99 mg/dL   Comment 1 Notify RN    GLUCOSE, CAPILLARY     Status: None   Collection Time    08/27/12  4:35 PM      Result Value Range   Glucose-Capillary 72  70 - 99 mg/dL  GLUCOSE, CAPILLARY     Status: None   Collection Time    08/27/12  8:50 PM      Result Value Range   Glucose-Capillary 94  70 - 99 mg/dL  GLUCOSE, CAPILLARY     Status: None   Collection Time    08/28/12  7:35 AM      Result Value Range   Glucose-Capillary 81  70 - 99 mg/dL   Comment 1 Notify RN        Constitutional: He appears well-developed and well-nourished.  Elderly, frail appearing male  HENT:  Head: Normocephalic and atraumatic.  Eyes: Conjunctivae and EOM are normal. Pupils are equal, round, and reactive  to light.  Neck: Normal range of motion. Neck supple.  Cardiovascular: Normal rate and regular rhythm. No wheezes or rales  Pulmonary/Chest: Effort normal. No respiratory distress.  No cough.  Abdominal: Soft. Bowel sounds are normal. He exhibits no distension. There is no tenderness.  Musculoskeletal: He exhibits no edema and no tenderness.  Bilateral knees with large psoriatic patches. No pain with ROM.  Neurological: He is alert.  Oriented to self and place, reason he's here.  RUE 4/5. LUE is 3+/5. LLE is 2+ to 3/5 prox to 4- at ankle. RLE is 3/5 prox to 4/5 distally. Sensation grossly intact in all 4. Speech dysarthric. But no focal CN findings. Follows 2step commands and answered biographical questions. Has fair insight and awareness. Cooperates with the entirety of my exam.  Skin: Skin is warm and dry.   Assessment/Plan: 1. Functional deficits secondary to Deconditioning following pneumonia, bacteremia, respiratory failure which require 3+ hours per day of interdisciplinary therapy in a comprehensive inpatient rehab setting. Physiatrist is providing close team supervision and 24 hour management of active medical problems listed below. Physiatrist and rehab team continue to assess barriers to discharge/monitor patient progress toward functional and medical goals.  DC today. Follow up arranged  FIM: FIM - Bathing Bathing Steps Patient Completed: Chest;Right Arm;Left Arm;Abdomen;Left upper leg;Right upper leg;Buttocks;Front perineal area;Right lower leg (including foot);Left lower leg (including foot) Bathing: 6: More than reasonable amount of time  FIM - Upper Body Dressing/Undressing Upper body dressing/undressing steps patient completed: Thread/unthread right sleeve of pullover shirt/dresss;Thread/unthread left sleeve of pullover shirt/dress;Put head through opening of pull over shirt/dress;Pull shirt over trunk Upper body dressing/undressing: 6: More than reasonable amount of  time FIM - Lower Body Dressing/Undressing Lower body dressing/undressing steps patient completed: Thread/unthread left pants leg;Thread/unthread right pants leg;Pull pants up/down;Don/Doff right sock;Don/Doff left sock;Fasten/unfasten pants;Don/Doff right shoe;Don/Doff left shoe;Fasten/unfasten right shoe;Fasten/unfasten left shoe Lower body dressing/undressing: 5: Set-up assist to: Obtain clothing  FIM - Toileting Toileting steps completed by patient: Adjust clothing prior to toileting;Performs perineal hygiene;Adjust clothing after toileting  Toileting Assistive Devices: Grab bar or rail for support Toileting: 6: More than reasonable amount of time  FIM - Diplomatic Services operational officer Devices: Best boy Transfers: 5-To toilet/BSC: Supervision (verbal cues/safety issues);5-From toilet/BSC: Supervision (verbal cues/safety issues)  FIM - Banker Devices: Therapist, occupational: 6: Assistive device: no helper;6: Supine > Sit: No assist;6: Sit > Supine: No assist;6: Bed > Chair or W/C: No assist;6: Chair or W/C > Bed: No assist  FIM - Locomotion: Wheelchair Distance: 170 Locomotion: Wheelchair: 5: Travels 150 ft or more: maneuvers on rugs and over door sills with supervision, cueing or coaxing FIM - Locomotion: Ambulation Locomotion: Ambulation Assistive Devices: Designer, industrial/product Ambulation/Gait Assistance: 5: Supervision Locomotion: Ambulation: 5: Travels 150 ft or more with supervision/safety issues  Comprehension Comprehension Mode: Auditory Comprehension: 6-Follows complex conversation/direction: With extra time/assistive device  Expression Expression Mode: Verbal Expression: 6-Expresses complex ideas: With extra time/assistive device  Social Interaction Social Interaction: 7-Interacts appropriately with others - No medications needed.  Problem Solving Problem Solving: 5-Solves complex 90% of the time/cues <  10% of the time  Memory Memory: 6-More than reasonable amt of time :   Medical Problem List and Plan:  1. DVT Prophylaxis/Anticoagulation: Pharmaceutical: Lovenox  2. Pain Management: N/A  3. Mood: Pleasant and appropriate today. Will have LCSW follow for evaluation.  4. Neuropsych: This patient is capable of making decisions on his own behalf.  5. CKD: Stage IV. Does not want dialysis. Will need to monitor for fluid overload and/or signs of uremia. Off Lasix due to acute on chronic renal failure. Resume calcitriol.  6. CHF:   daily weights. Follow up with pcp.  7. Diarrhea: Probiotic added. improved 8. COPD: Will continue spirivia. Will order rescue inhaler for use prn. Continue oxygen at nights.  9. DM type 2 diet controlled: Will monitor with ac/hs cbg checks. Has been hypoglycemic in am. Add HS snack.  10 Thrombocytopenia: monitor lovenox.  11. H/o prostate cancer: Reports worsening of frequency/urgency. Off Hytrin currently secondary to low BPs. Will check PVRs. Toilet while awake.  12. Psoriasis: Resumed lidex to psoriatic lesions. Will add Eucerin cream additionally to dry areas.  LOS (Days) 10 A FACE TO FACE EVALUATION WAS PERFORMED  Alexandro Line T 08/28/2012, 9:21 AM

## 2012-08-28 NOTE — Progress Notes (Signed)
Social Work Discharge Note Discharge Note  The overall goal for the admission was met for:   Discharge location: Yes-HOME WITH WIFE AND DAUGHTER'S ASSISTING  Length of Stay: Yes-10 DAYS  Discharge activity level: Yes-SUPERVISION/MOD/I LEVEL  Home/community participation: Yes  Services provided included: MD, RD, PT, OT, SLP, RN, Pharmacy, Neuropsych and SW  Financial Services: Medicare  Follow-up services arranged: [facility]: [facility] and Patient/Family request agency HH: [facility], DME: HAS ALL FROM PREVIOUS  Comments (or additional information):RECOMMEDNATION IS SUPERVISION LEVEL, WIFE HAS SOME MEMORY ISSUES AND DAUGHTER'S WORK- SO WILL DO THE BEST THEY CAN PROVIDING ASSISTANCE  Patient/Family verbalized understanding of follow-up arrangements: Yes  Individual responsible for coordination of the follow-up plan: CAROLYN-DAUGHTER  Confirmed correct DME delivered: Lucy Chris 08/28/2012    Lucy Chris

## 2012-08-29 NOTE — Discharge Summary (Signed)
Physician Discharge Summary  Patient ID: Brad Jones MRN: [id number] DOB/AGE: 1927/02/16 79 y.o.  Admit date: 08/18/2012 Discharge date: 08/28/12  Discharge Diagnoses:  Active Problems:   Physical deconditioning   Chronic kidney disease (CKD), stage IV (severe)   COPD (chronic obstructive pulmonary disease)   Thrombocytopenia, unspecified   Macular degeneration   Diarrhea   OSA (obstructive sleep apnea)--non compliant with CPAP   Discharged Condition: Good  Labs:  Basic Metabolic Panel:  Recent Labs Lab 08/25/12 0520  CREATININE 3.29*   BMET    Component Value Date/Time   NA 140 08/22/2012 0605   K 5.1 08/22/2012 0605   CL 101 08/22/2012 0605   CO2 30 08/22/2012 0605   GLUCOSE 90 08/22/2012 0605   BUN 36* 08/22/2012 0605   CREATININE 3.29* 08/25/2012 0520   CALCIUM 9.4 08/22/2012 0605   GFRNONAA 16* 08/25/2012 0520   GFRAA 19* 08/25/2012 0520     CBC:  Recent Labs Lab 08/22/12 1857  WBC 11.4*  HGB 11.3*  HCT 36.0*  MCV 82.0  PLT 199    CBG:  Recent Labs Lab 08/27/12 1318 08/27/12 1635 08/27/12 2050 08/28/12 0735 08/28/12 1110  GLUCAP 102* 72 94 81 118*    Brief HPI:   Brad Jones is a 79 y.o. male with history of HTN, DM, COPD, macular degeneration, CKD (does not want dialysis); admitted on 08/10/12 with h/o malaise, syncope leading to unresponsiveness in setting of severe CAP and acute on chronic renal failure. Patient intubated at [facility] ED and transferred to [facility] for treatment. He was treated with IV antibiotics for sepsis due to strep bacteremia. CT head with global atrophy with right frontal lobe infarct of indeterminate age and moderate SVD. Confusion due to encephalopathy has been resolving but he was noted to be deconditioned and CIR recommended for follow up therapies.    Hospital Course: Brad Jones was admitted to rehab 08/18/2012 for inpatient therapies to consist of PT, ST and OT at least three hours five days a week. Past admission physiatrist,  therapy team and rehab RN have worked together to provide customized collaborative inpatient rehab. He continued to have problems with diarrhea despite negative c-diff culture. He was started on probiotics as well as Fiber Con with improvement GI symptoms and continence. He was noted to require oxygen at all times due to hypoxia. He has not been compliant with CPAP use and family reported poor compliance at home also. He is to continue using oxygen at all times to maintain adequate oxygenation. Po intake has improved. Vital have been monitored on bid basis and BP has been well controlled. Renal status has shown good improvement and no signs of overload off diuretics. Blood sugars have been well controlled without any oral meds. He has had occasional hypoglycemic episodes in between meals and  has been educated on eating small snacks between meals.    Rehab course: During patient's stay in rehab weekly team conferences were held to monitor patient's progress, set goals and discuss barriers to discharge. Speech therapy-addressing cognitive-linguistic goals. He  requires intermittent supervision with medication management and overall recall.  However is able to utilize strategies such as using external aids and double checking with reminder.  He requires supervision for bathing and dressing tasks as well as ambulation. Family has been educated on assisting patient with medication management. He has had improved activity tolerance, balance as well as increased strength but due to multiple medical issues; supervision was recommended for safety.  Disposition: 01-Home or Self Care  Diet: Diabetic diet. Eat small snacks between meals.   Special Instructions: 1. Wear CPAP at nights. 2. Use oxygen at all times.  3. [facility] to provide HHPT, HHOT, HHRN.     Future Appointments Provider Department Dept Phone   09/23/2012 11:15 AM Erick Colace, MD [facility] [phone]        Medication List    STOP taking these medications       furosemide 40 MG tablet  Commonly known as:  LASIX     metoprolol 200 MG 24 hr tablet  Commonly known as:  TOPROL-XL     terazosin 2 MG capsule  Commonly known as:  HYTRIN      TAKE these medications       albuterol 108 (90 BASE) MCG/ACT inhaler  Commonly known as:  PROVENTIL HFA;VENTOLIN HFA  Inhale 2 puffs into the lungs every 6 (six) hours as needed for wheezing.     aspirin 81 MG tablet  Take 81 mg by mouth daily.     calcitRIOL 0.25 MCG capsule  Commonly known as:  ROCALTROL  Take 0.25 mcg by mouth daily.     docusate sodium 50 MG capsule  Commonly known as:  COLACE  Take by mouth at bedtime.     fluocinonide ointment 0.05 %  Commonly known as:  LIDEX  Apply 1 application topically 2 (two) times daily.     HYDROcodone-acetaminophen 5-325 MG per tablet  Commonly known as:  NORCO/VICODIN  Take 1 tablet by mouth every 6 (six) hours as needed for pain.     metoprolol 50 MG tablet  Commonly known as:  LOPRESSOR  Take 1 tablet (50 mg total) by mouth 2 (two) times daily. Double check dose at home.     nitroGLYCERIN 0.4 MG SL tablet  Commonly known as:  NITROSTAT  Place 0.4 mg under the tongue every 5 (five) minutes as needed for chest pain.     polycarbophil 625 MG tablet  Commonly known as:  FIBERCON  Take 2 tablets (1,250 mg total) by mouth daily. Over the counter to help bulk stools.     tiotropium 18 MCG inhalation capsule  Commonly known as:  SPIRIVA  Place 18 mcg into inhaler and inhale daily.           Follow-up Information   Follow up with Erick Colace, MD On 09/23/2012. (Be there at 10:45  for 11:15 am appointment)    Contact information:   [address] [place] [address] [phone]       Follow up with Default, Provider, MD. Call today. (for follow up with [facility] in next 10-14 days.  Also have Neurology/psychology follow up for cognitive  evaluation/dementia. )    Contact information:   [address] [place] [address] [phone]       Signed: Jacquelynn Cree 08/29/2012, 8:56 AM

## 2012-09-23 ENCOUNTER — Encounter: Payer: Medicare Other | Attending: Physical Medicine & Rehabilitation

## 2012-09-23 ENCOUNTER — Inpatient Hospital Stay: Payer: Medicare Other | Admitting: Physical Medicine & Rehabilitation

## 2014-02-22 ENCOUNTER — Encounter (HOSPITAL_COMMUNITY): Payer: Self-pay | Admitting: Emergency Medicine

## 2014-02-22 ENCOUNTER — Inpatient Hospital Stay (HOSPITAL_COMMUNITY)
Admission: EM | Admit: 2014-02-22 | Discharge: 2014-03-02 | DRG: 551 | Disposition: A | Discharge status: Skilled Nursing Facility | Payer: Medicare Other | Attending: Internal Medicine | Admitting: Internal Medicine

## 2014-02-22 ENCOUNTER — Emergency Department (HOSPITAL_COMMUNITY): Payer: Medicare Other

## 2014-02-22 DIAGNOSIS — L03114 Cellulitis of left upper limb: Secondary | ICD-10-CM | POA: Diagnosis present

## 2014-02-22 DIAGNOSIS — G4733 Obstructive sleep apnea (adult) (pediatric): Secondary | ICD-10-CM | POA: Diagnosis present

## 2014-02-22 DIAGNOSIS — E1142 Type 2 diabetes mellitus with diabetic polyneuropathy: Secondary | ICD-10-CM | POA: Diagnosis present

## 2014-02-22 DIAGNOSIS — D696 Thrombocytopenia, unspecified: Secondary | ICD-10-CM | POA: Diagnosis present

## 2014-02-22 DIAGNOSIS — J449 Chronic obstructive pulmonary disease, unspecified: Secondary | ICD-10-CM | POA: Diagnosis present

## 2014-02-22 DIAGNOSIS — R531 Weakness: Secondary | ICD-10-CM | POA: Diagnosis not present

## 2014-02-22 DIAGNOSIS — D631 Anemia in chronic kidney disease: Secondary | ICD-10-CM | POA: Diagnosis present

## 2014-02-22 DIAGNOSIS — G9389 Other specified disorders of brain: Secondary | ICD-10-CM | POA: Diagnosis present

## 2014-02-22 DIAGNOSIS — G9589 Other specified diseases of spinal cord: Secondary | ICD-10-CM | POA: Diagnosis present

## 2014-02-22 DIAGNOSIS — N19 Unspecified kidney failure: Secondary | ICD-10-CM

## 2014-02-22 DIAGNOSIS — R29898 Other symptoms and signs involving the musculoskeletal system: Secondary | ICD-10-CM | POA: Insufficient documentation

## 2014-02-22 DIAGNOSIS — G629 Polyneuropathy, unspecified: Secondary | ICD-10-CM | POA: Diagnosis present

## 2014-02-22 DIAGNOSIS — S14103A Unspecified injury at C3 level of cervical spinal cord, initial encounter: Secondary | ICD-10-CM | POA: Diagnosis present

## 2014-02-22 DIAGNOSIS — N184 Chronic kidney disease, stage 4 (severe): Secondary | ICD-10-CM | POA: Diagnosis present

## 2014-02-22 DIAGNOSIS — Z87891 Personal history of nicotine dependence: Secondary | ICD-10-CM | POA: Diagnosis not present

## 2014-02-22 DIAGNOSIS — E875 Hyperkalemia: Secondary | ICD-10-CM | POA: Diagnosis present

## 2014-02-22 DIAGNOSIS — W06XXXA Fall from bed, initial encounter: Secondary | ICD-10-CM | POA: Diagnosis present

## 2014-02-22 DIAGNOSIS — D649 Anemia, unspecified: Secondary | ICD-10-CM | POA: Diagnosis present

## 2014-02-22 DIAGNOSIS — T1490XA Injury, unspecified, initial encounter: Secondary | ICD-10-CM

## 2014-02-22 DIAGNOSIS — Z8546 Personal history of malignant neoplasm of prostate: Secondary | ICD-10-CM | POA: Diagnosis not present

## 2014-02-22 DIAGNOSIS — E440 Moderate protein-calorie malnutrition: Secondary | ICD-10-CM | POA: Diagnosis present

## 2014-02-22 DIAGNOSIS — I129 Hypertensive chronic kidney disease with stage 1 through stage 4 chronic kidney disease, or unspecified chronic kidney disease: Secondary | ICD-10-CM | POA: Diagnosis present

## 2014-02-22 DIAGNOSIS — I5032 Chronic diastolic (congestive) heart failure: Secondary | ICD-10-CM | POA: Diagnosis present

## 2014-02-22 DIAGNOSIS — M4802 Spinal stenosis, cervical region: Principal | ICD-10-CM | POA: Diagnosis present

## 2014-02-22 DIAGNOSIS — R0602 Shortness of breath: Secondary | ICD-10-CM

## 2014-02-22 DIAGNOSIS — L409 Psoriasis, unspecified: Secondary | ICD-10-CM | POA: Diagnosis present

## 2014-02-22 DIAGNOSIS — B9562 Methicillin resistant Staphylococcus aureus infection as the cause of diseases classified elsewhere: Secondary | ICD-10-CM | POA: Diagnosis present

## 2014-02-22 DIAGNOSIS — N179 Acute kidney failure, unspecified: Secondary | ICD-10-CM | POA: Diagnosis present

## 2014-02-22 DIAGNOSIS — Z7982 Long term (current) use of aspirin: Secondary | ICD-10-CM | POA: Diagnosis not present

## 2014-02-22 DIAGNOSIS — I69354 Hemiplegia and hemiparesis following cerebral infarction affecting left non-dominant side: Secondary | ICD-10-CM | POA: Diagnosis not present

## 2014-02-22 DIAGNOSIS — L989 Disorder of the skin and subcutaneous tissue, unspecified: Secondary | ICD-10-CM

## 2014-02-22 DIAGNOSIS — I10 Essential (primary) hypertension: Secondary | ICD-10-CM | POA: Diagnosis present

## 2014-02-22 DIAGNOSIS — I5189 Other ill-defined heart diseases: Secondary | ICD-10-CM | POA: Insufficient documentation

## 2014-02-22 DIAGNOSIS — Z9079 Acquired absence of other genital organ(s): Secondary | ICD-10-CM | POA: Diagnosis present

## 2014-02-22 LAB — COMPREHENSIVE METABOLIC PANEL
ALBUMIN: 2.9 g/dL — AB (ref 3.5–5.2)
ALT: 11 U/L (ref 0–53)
ANION GAP: 6 (ref 5–15)
AST: 31 U/L (ref 0–37)
Alkaline Phosphatase: 55 U/L (ref 39–117)
BUN: 34 mg/dL — AB (ref 6–23)
CALCIUM: 9.5 mg/dL (ref 8.4–10.5)
CO2: 28 mmol/L (ref 19–32)
Chloride: 112 mmol/L (ref 96–112)
Creatinine, Ser: 4.27 mg/dL — ABNORMAL HIGH (ref 0.50–1.35)
GFR calc non Af Amer: 12 mL/min — ABNORMAL LOW (ref >90)
GFR, EST AFRICAN AMERICAN: 13 mL/min — AB (ref >90)
GLUCOSE: 108 mg/dL — AB (ref 70–99)
POTASSIUM: 4.4 mmol/L (ref 3.5–5.1)
Sodium: 146 mmol/L — ABNORMAL HIGH (ref 135–145)
TOTAL PROTEIN: 5.7 g/dL — AB (ref 6.0–8.3)
Total Bilirubin: 0.6 mg/dL (ref 0.3–1.2)

## 2014-02-22 LAB — TROPONIN I: Troponin I: <0.03 ng/mL (ref <0.031)

## 2014-02-22 LAB — CBC WITH DIFFERENTIAL/PLATELET
BASOS ABS: 0.1 10*3/uL (ref 0.0–0.1)
BASOS PCT: 1 % (ref 0–1)
EOS ABS: 0.3 10*3/uL (ref 0.0–0.7)
Eosinophils Relative: 3 % (ref 0–5)
HEMATOCRIT: 34.9 % — AB (ref 39.0–52.0)
Hemoglobin: 10.8 g/dL — ABNORMAL LOW (ref 13.0–17.0)
Lymphocytes Relative: 10 % — ABNORMAL LOW (ref 12–46)
Lymphs Abs: 0.9 10*3/uL (ref 0.7–4.0)
MCH: 24.9 pg — AB (ref 26.0–34.0)
MCHC: 30.9 g/dL (ref 30.0–36.0)
MCV: 80.4 fL (ref 78.0–100.0)
Monocytes Absolute: 0.7 10*3/uL (ref 0.1–1.0)
Monocytes Relative: 7 % (ref 3–12)
NEUTROS ABS: 7.5 10*3/uL (ref 1.7–7.7)
NEUTROS PCT: 79 % — AB (ref 43–77)
Platelets: 110 10*3/uL — ABNORMAL LOW (ref 150–400)
RBC: 4.34 MIL/uL (ref 4.22–5.81)
RDW: 15.1 % (ref 11.5–15.5)
WBC: 9.5 10*3/uL (ref 4.0–10.5)

## 2014-02-22 LAB — URINALYSIS, ROUTINE W REFLEX MICROSCOPIC
Bilirubin Urine: NEGATIVE
GLUCOSE, UA: NEGATIVE mg/dL
KETONES UR: NEGATIVE mg/dL
Leukocytes, UA: NEGATIVE
Nitrite: NEGATIVE
PH: 5 (ref 5.0–8.0)
Protein, ur: 30 mg/dL — AB
SPECIFIC GRAVITY, URINE: 1.017 (ref 1.005–1.030)
Urobilinogen, UA: 0.2 mg/dL (ref 0.0–1.0)

## 2014-02-22 LAB — URINE MICROSCOPIC-ADD ON

## 2014-02-22 LAB — RETICULOCYTES
RBC.: 3.88 MIL/uL — ABNORMAL LOW (ref 4.22–5.81)
RETIC CT PCT: 1 % (ref 0.4–3.1)
Retic Count, Absolute: 38.8 10*3/uL (ref 19.0–186.0)

## 2014-02-22 LAB — GLUCOSE, CAPILLARY: GLUCOSE-CAPILLARY: 123 mg/dL — AB (ref 70–99)

## 2014-02-22 LAB — CBG MONITORING, ED: Glucose-Capillary: 76 mg/dL (ref 70–99)

## 2014-02-22 MED ORDER — ASPIRIN EC 81 MG PO TBEC
81.0000 mg | DELAYED_RELEASE_TABLET | Freq: Every day | ORAL | Status: DC
  Administered 2014-02-23 – 2014-03-02 (×8): 81 mg via ORAL
  Filled 2014-02-22 (×8): qty 1

## 2014-02-22 MED ORDER — DEXTROSE 5 % AND 0.45 % NACL IV BOLUS
250.0000 mL | Freq: Once | INTRAVENOUS | Status: AC
  Administered 2014-02-22: 250 mL via INTRAVENOUS

## 2014-02-22 MED ORDER — DOCUSATE SODIUM 100 MG PO CAPS
100.0000 mg | ORAL_CAPSULE | Freq: Every day | ORAL | Status: DC
  Administered 2014-02-22 – 2014-03-01 (×8): 100 mg via ORAL
  Filled 2014-02-22 (×9): qty 1

## 2014-02-22 MED ORDER — FLUOCINONIDE 0.05 % EX OINT
1.0000 "application " | TOPICAL_OINTMENT | Freq: Two times a day (BID) | CUTANEOUS | Status: DC
  Administered 2014-02-22 – 2014-03-02 (×16): 1 via TOPICAL
  Filled 2014-02-22 (×2): qty 15

## 2014-02-22 MED ORDER — STROKE: EARLY STAGES OF RECOVERY BOOK
Freq: Once | Status: AC
  Administered 2014-02-22: 1

## 2014-02-22 MED ORDER — TERAZOSIN HCL 1 MG PO CAPS
1.0000 mg | ORAL_CAPSULE | Freq: Every day | ORAL | Status: DC
  Administered 2014-02-22 – 2014-03-01 (×8): 1 mg via ORAL
  Filled 2014-02-22 (×12): qty 1

## 2014-02-22 MED ORDER — HYDROCODONE-ACETAMINOPHEN 5-325 MG PO TABS
1.0000 | ORAL_TABLET | Freq: Four times a day (QID) | ORAL | Status: DC | PRN
  Administered 2014-02-23 – 2014-03-01 (×10): 1 via ORAL
  Filled 2014-02-22 (×10): qty 1

## 2014-02-22 MED ORDER — ALBUTEROL SULFATE (2.5 MG/3ML) 0.083% IN NEBU: 2.5000 mg | INHALATION_SOLUTION | Freq: Four times a day (QID) | RESPIRATORY_TRACT | Status: DC | PRN

## 2014-02-22 MED ORDER — CALCITRIOL 0.25 MCG PO CAPS
0.2500 ug | ORAL_CAPSULE | ORAL | Status: DC
  Administered 2014-02-24 – 2014-03-01 (×3): 0.25 ug via ORAL
  Filled 2014-02-22 (×3): qty 1

## 2014-02-22 MED ORDER — HEPARIN SODIUM (PORCINE) 5000 UNIT/ML IJ SOLN
5000.0000 [IU] | Freq: Three times a day (TID) | INTRAMUSCULAR | Status: DC
  Administered 2014-02-23 – 2014-03-02 (×22): 5000 [IU] via SUBCUTANEOUS
  Filled 2014-02-22 (×21): qty 1

## 2014-02-22 MED ORDER — SODIUM CHLORIDE 0.9 % IV SOLN
INTRAVENOUS | Status: DC
  Administered 2014-02-22: 15:00:00 via INTRAVENOUS

## 2014-02-22 MED ORDER — CALCIUM POLYCARBOPHIL 625 MG PO TABS
1250.0000 mg | ORAL_TABLET | Freq: Every day | ORAL | Status: DC
  Administered 2014-02-23 – 2014-03-02 (×8): 1250 mg via ORAL
  Filled 2014-02-22 (×8): qty 2

## 2014-02-22 MED ORDER — TIOTROPIUM BROMIDE MONOHYDRATE 18 MCG IN CAPS
18.0000 ug | ORAL_CAPSULE | Freq: Every day | RESPIRATORY_TRACT | Status: DC
  Administered 2014-02-25 – 2014-03-02 (×6): 18 ug via RESPIRATORY_TRACT
  Filled 2014-02-22 (×3): qty 5

## 2014-02-22 MED ORDER — DOXYCYCLINE HYCLATE 100 MG PO TABS
100.0000 mg | ORAL_TABLET | Freq: Two times a day (BID) | ORAL | Status: DC
  Administered 2014-02-22 – 2014-03-01 (×14): 100 mg via ORAL
  Filled 2014-02-22 (×15): qty 1

## 2014-02-22 NOTE — ED Provider Notes (Signed)
CSN: [id number]     Arrival date & time 02/22/14  1312 History   First MD Initiated Contact with Patient 02/22/14 1326     Chief Complaint  Patient presents with  . Weakness     (Consider location/radiation/quality/duration/timing/severity/associated sxs/prior Treatment) HPI Comments: Patient here complaining of diffuse body weakness 2 days. Patient slid out of his bed onto the floor 2 days ago and was helped up by his son. He does normally use a walker to ambulate. Denies any fever or chills. No chest pain or shortness of breath. No recent vomiting or diarrhea. Does note some polyuria. Has a history of CVA in the past with left-sided deficits. Denies any focality to his weakness. Symptoms persistent and worse with movement and better with rest. No treatment used for this prior to arrival. Denies any history of head trauma with the initial fall. Denies any neck pain.  Patient is a 81 y.o. male presenting with weakness. The history is provided by the patient.  Weakness    Past Medical History  Diagnosis Date  . Hypertension   . CHF (congestive heart failure)   . COPD (chronic obstructive pulmonary disease)     use 2 1/2 L oxygen at nights  . Emphysema   . Diabetes mellitus without complication     non insulin dependent  . Cancer     prostate  . Renal disorder kidneys only function at 20%    pt does not want dialysis  . Stroke     TIA, multiple   Past Surgical History  Procedure Laterality Date  . Prostatectomy     History reviewed. No pertinent family history. History  Substance Use Topics  . Smoking status: Former Smoker    Quit date: 03/20/2012  . Smokeless tobacco: Never Used  . Alcohol Use: No    Review of Systems  Neurological: Positive for weakness.  All other systems reviewed and are negative.     Allergies  Review of patient's allergies indicates no known allergies.  Home Medications   Prior to Admission medications   Medication Sig Start Date End  Date Taking? Authorizing Provider  albuterol (PROVENTIL HFA;VENTOLIN HFA) 108 (90 BASE) MCG/ACT inhaler Inhale 2 puffs into the lungs every 6 (six) hours as needed for wheezing.    Historical Provider, MD  aspirin 81 MG tablet Take 81 mg by mouth daily.    Historical Provider, MD  calcitRIOL (ROCALTROL) 0.25 MCG capsule Take 0.25 mcg by mouth daily.    Historical Provider, MD  docusate sodium (COLACE) 50 MG capsule Take by mouth at bedtime.    Historical Provider, MD  fluocinonide ointment (LIDEX) 9.56 % Apply 1 application topically 2 (two) times daily.    Historical Provider, MD  HYDROcodone-acetaminophen (NORCO/VICODIN) 5-325 MG per tablet Take 1 tablet by mouth every 6 (six) hours as needed for pain.    Historical Provider, MD  metoprolol (LOPRESSOR) 50 MG tablet Take 1 tablet (50 mg total) by mouth 2 (two) times daily. Double check dose at home. 08/28/12   Bary Leriche, PA-C  nitroGLYCERIN (NITROSTAT) 0.4 MG SL tablet Place 0.4 mg under the tongue every 5 (five) minutes as needed for chest pain.    Historical Provider, MD  polycarbophil (FIBERCON) 625 MG tablet Take 2 tablets (1,250 mg total) by mouth daily. Over the counter to help bulk stools. 08/28/12   Ivan Anchors Love, PA-C  tiotropium (SPIRIVA) 18 MCG inhalation capsule Place 18 mcg into inhaler and inhale daily.    Historical  Provider, MD   BP 124/65 mmHg  Pulse 87  Temp(Src) 98.7 F (37.1 C) (Oral)  Resp 18  SpO2 100% Physical Exam  Constitutional: He is oriented to person, place, and time. He appears well-developed and well-nourished.  Non-toxic appearance. No distress.  HENT:  Head: Normocephalic and atraumatic.  Eyes: Conjunctivae, EOM and lids are normal. Pupils are equal, round, and reactive to light.  Neck: Normal range of motion. Neck supple. No tracheal deviation present. No thyroid mass present.  Cardiovascular: Normal rate, regular rhythm and normal heart sounds.  Exam reveals no gallop.   No murmur  heard. Pulmonary/Chest: Effort normal and breath sounds normal. No stridor. No respiratory distress. He has no decreased breath sounds. He has no wheezes. He has no rhonchi. He has no rales.  Abdominal: Soft. Normal appearance and bowel sounds are normal. He exhibits no distension. There is no tenderness. There is no rebound and no CVA tenderness.  Musculoskeletal: Normal range of motion. He exhibits no edema or tenderness.  Neurological: He is alert and oriented to person, place, and time. He displays no atrophy. No cranial nerve deficit or sensory deficit. GCS eye subscore is 4. GCS verbal subscore is 5. GCS motor subscore is 6.  Left lower extremity and left upper extremity strength 3 out of 5. Right upper right lower extremity strength normal.  Skin: Skin is warm and dry. No abrasion and no rash noted.  Psychiatric: He has a normal mood and affect. His speech is normal and behavior is normal.  Nursing note and vitals reviewed.   ED Course  Procedures (including critical care time) Labs Review Labs Reviewed  URINE CULTURE  URINALYSIS, ROUTINE W REFLEX MICROSCOPIC  CBC WITH DIFFERENTIAL/PLATELET  COMPREHENSIVE METABOLIC PANEL  TROPONIN I    Imaging Review No results found.   EKG Interpretation   Date/Time:  Monday February 22 2014 13:21:41 EST Ventricular Rate:  84 PR Interval:  148 QRS Duration: 95 QT Interval:  379 QTC Calculation: 448 R Axis:   116 Text Interpretation:  Sinus rhythm Right axis deviation Borderline T wave  abnormalities Confirmed by Zenia Resides  MD, Osmel Dykstra ([id number]) on 02/22/2014 4:57:53  PM      MDM   Final diagnoses:  Trauma    Patient to be admitted for worsening weakness and renal failure    Leota Jacobsen, MD 02/22/14 1658

## 2014-02-22 NOTE — ED Notes (Signed)
Attempted Report 

## 2014-02-22 NOTE — ED Notes (Signed)
Family reporting patient has not eaten since this AM and that he is a diabetic. Would like sugar checked.

## 2014-02-22 NOTE — ED Notes (Signed)
Admitting MD at bedside.

## 2014-02-22 NOTE — Progress Notes (Signed)
Patient arrived to 4N05 from ED. Oriented to room and unit. VSS. Telemetry placed. Patient in good mood but complaining of left knee pain. Will continue to monitor patient closely. Burnell Blanks, RN

## 2014-02-22 NOTE — ED Notes (Signed)
Admitting MD paged. States will be by in 30 min

## 2014-02-22 NOTE — ED Notes (Addendum)
Pt. Daughter: [phone]

## 2014-02-22 NOTE — H&P (Signed)
Date: 02/22/2014               Patient Name:  Brad Jones MRN: [id number]  DOB: 10/21/1927 Age / Sex: 81 y.o., male   PCP: Provider Default, MD         Medical Service: Internal Medicine Teaching Service         Attending Physician: Dr. Axel Filler, MD    First Contact: Dr. Julious Oka Pager: [phone]  Second Contact: Dr. 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 H says that he has had worsening L sided weakness of his arm and leg for the past two weeks. He had an unwitnessed fall on Friday 1/22. He says he was conscious the entire time without any confusion or bowel or bladder incontinence or any head trauma. He thinks the weakness has been constant over this time period and is "just a hair" better today than two weeks ago. He normally uses a walker to ambulate but has had difficulty ambulating over this time period.  He is also concerned that he had felt cold in his hands and feet for the past two months. There is also a numbness which has been present for years. He burnt the base of his left third finger about two weeks ago with a hot pack and since developed a blister that his daughter drained with clear fluid. He also says he has had a frontal headache for the past few days. He denies any chest pain, changes in baseline SOB, palpitations, abdominal pain, nausea, emesis, diarrhea, constipation.  Of note, [facility] with limited encounters in our E[facility]. He was hospitalized in 07/2012 for acute respiratory failure 2/2 CAP with strep pneumo bacteremia requiring intubation and acute on chronic renal failure with discharge to inpatient rehab. Brad  Jones and his family want his care transferred to the [place]. They contacted the [place] before coming to [facility] and were told there were no beds. They provided the phone number [phone] to coordinate transfer.  Meds: Current Facility-Administered Medications  Medication Dose Route Frequency Provider Last Rate Last Dose  . 0.9 %  sodium chloride infusion   Intravenous Continuous Leota Jacobsen, MD 15 mL/hr at 02/22/14 1459    . doxycycline (VIBRA-TABS) tablet 100 mg  100 mg Oral Q12H Otho Bellows, MD       Current Outpatient Prescriptions  Medication Sig Dispense Refill  . albuterol (PROVENTIL HFA;VENTOLIN HFA) 108 (90 BASE) MCG/ACT inhaler Inhale 2 puffs into the lungs every 6 (six) hours as needed for wheezing.    Marland Kitchen aspirin 81 MG tablet Take 81 mg by mouth daily.    . calcitRIOL (ROCALTROL) 0.25 MCG capsule Take 0.25 mcg by mouth every Monday, Wednesday, and Friday.     . docusate sodium (COLACE) 50 MG capsule Take by mouth at bedtime.    . fluocinonide ointment (LIDEX) 7.20 % Apply 1 application topically 2 (two) times daily.    Marland Kitchen HYDROcodone-acetaminophen (NORCO/VICODIN) 5-325 MG per tablet Take 1 tablet by mouth every 6 (six) hours as needed for pain.    . metoprolol succinate (TOPROL-XL)  100 MG 24 hr tablet Take 50 mg by mouth daily. Take with or immediately following a meal.    . polycarbophil (FIBERCON) 625 MG tablet Take 2 tablets (1,250 mg total) by mouth daily. Over the counter to help bulk stools.    . terazosin (HYTRIN) 1 MG capsule Take 1 mg by mouth at bedtime.    Marland Kitchen tiotropium (SPIRIVA) 18 MCG inhalation capsule Place 18 mcg into inhaler and inhale daily.    . nitroGLYCERIN (NITROSTAT) 0.4 MG SL tablet Place 0.4 mg under the tongue every 5 (five) minutes as needed for chest pain.      Allergies: Allergies as of 02/22/2014  . (No Known Allergies)   Past Medical History  Diagnosis Date  . Hypertension   . CHF (congestive heart failure)   . COPD (chronic  obstructive pulmonary disease)     use 2 1/2 L oxygen at nights  . Emphysema   . Diabetes mellitus without complication     non insulin dependent  . Cancer     prostate  . Renal disorder kidneys only function at 20%    pt does not want dialysis  . Stroke     TIA, multiple   Past Surgical History  Procedure Laterality Date  . Prostatectomy     History reviewed. No pertinent family history. History   Social History  . Marital Status: Married    Spouse Name: N/A    Number of Children: N/A  . Years of Education: N/A   Occupational History  . Not on file.   Social History Main Topics  . Smoking status: Former Smoker    Quit date: 03/20/2012  . Smokeless tobacco: Never Used  . Alcohol Use: No  . Drug Use: Not on file  . Sexual Activity: Not on file   Other Topics Concern  . Not on file   Social History Narrative    Review of Systems: A comprehensive review of systems was negative except for: as noted above per HPI  Physical Exam: Blood pressure 122/58, pulse 79, temperature 98.9 F (37.2 C), temperature source Oral, resp. rate 21, SpO2 100 %.  Gen: No acute distress, well developed, well nourished HEENT: Atraumatic, PERRL, EOMI, sclerae anicteric, dry mucous membranes Heart: Regular rate and rhythm, normal S1 S2, no murmurs, rubs, or gallops Lungs: Clear to auscultation bilaterally, respirations unlabored Abd: Soft, non-tender, non-distended, + bowel sounds, no hepatosplenomegaly Ext: No edema or cyanosis, plaque psoriasis on R elbow and b/l knees,m ~ 1 cm scabbed ulcer lesion on dorsal L 3rd metacarpal-phalangeal joint with mild fluctuance which expressed scant serosanguinous fluid on pressure with surrounding erythema Neuro: A&O x 4, CN II-XII intact, decreased gross sensation in L and R hand and forearm, worse on L. 4/5 strength in R upper and lower extremities, 4/5 in L upper extremity but still weaker than L, 3/5 strength in LLE  Lab results: Basic Metabolic  Panel:  Recent Labs  02/22/14 1355  NA 146*  K 4.4  CL 112  CO2 28  GLUCOSE 108*  BUN 34*  CREATININE 4.27*  CALCIUM 9.5   Liver Function Tests:  Recent Labs  02/22/14 1355  AST 31  ALT 11  ALKPHOS 55  BILITOT 0.6  PROT 5.7*  ALBUMIN 2.9*   CBC:  Recent Labs  02/22/14 1355  WBC 9.5  NEUTROABS 7.5  HGB 10.8*  HCT 34.9*  MCV 80.4  PLT 110*   Cardiac Enzymes:  Recent Labs  02/22/14 1355  TROPONINI <0.03  Urinalysis:  Recent Labs  02/22/14 1349  COLORURINE YELLOW  LABSPEC 1.017  PHURINE 5.0  GLUCOSEU NEGATIVE  HGBUR MODERATE*  BILIRUBINUR NEGATIVE  KETONESUR NEGATIVE  PROTEINUR 30*  UROBILINOGEN 0.2  NITRITE NEGATIVE  LEUKOCYTESUR NEGATIVE  few bacteria, granular casts  Imaging results:  Dg Chest 2 View  02/22/2014   CLINICAL DATA:  Short of breath.  Initial encounter.  EXAM: CHEST  2 VIEW  COMPARISON:  08/15/2012.  FINDINGS: The cardiopericardial silhouette is within normal limits for size. Tortuous thoracic aorta with arch atherosclerosis. Pulmonary parenchymal scarring is present, most prominent at the bases. There is no airspace disease. No pleural effusion is identified. Suboptimal lateral view because the patient was unable to raise arms overhead. Partially translucent monitoring buttons are projected over the chest.  IMPRESSION: No acute cardiopulmonary disease.   Electronically Signed   By: Dereck Ligas M.D.   On: 02/22/2014 15:51   Ct Head Wo Contrast  02/22/2014   CLINICAL DATA:  Status post fall 02/20/2014 due to left-side weakness.  EXAM: CT HEAD WITHOUT CONTRAST  TECHNIQUE: Contiguous axial images were obtained from the base of the skull through the vertex without intravenous contrast.  COMPARISON:  Head CT scan 08/09/2012.  FINDINGS: Atrophy and chronic microvascular ischemic change are identified. No evidence of acute intracranial abnormality including hemorrhage, infarct, mass lesion, mass effect, midline shift or abnormal  extra-axial fluid collection is identified. There is no hydrocephalus or pneumocephalus. The calvarium is intact.  IMPRESSION: No acute abnormality.  Atrophy and chronic microvascular ischemic change.   Electronically Signed   By: Inge Rise M.D.   On: 02/22/2014 14:34    Other results: EKG: low voltage and poor quality, appears NSR with no gross t-wave or ST abnormalities, compared to prior 08/11/12.  Assessment & Plan by Problem: Principal Problem:   Weakness Active Problems:   Acute renal failure   Chronic kidney disease (CKD), stage IV (severe)   COPD (chronic obstructive pulmonary disease)   Thrombocytopenia   Anemia   Hand lesion   Hypertension  #Weakness: Brad Zagal does have a history of several TIAs, dates unknown. His head CT was negative for any acute intracranial process but TIA still possible. This does not appear to be cardiac in nature as his troponin is negative and EKG is unchanged but poor quality so will repeat.  He did not have any gross electrolyte abnormalities. His anemia with hemoglobin 10.8 is stable from 07/2012 so would not explain acute onset of weakness. While he has the pustule on his L hand that is expressing scant serosanguinous fluid, systemic infection seems unlikely given review of systems, 0/4 SIRS criteria, negative UA, negative CXR.  -consider neuro consult in am -MRI/MRA head brain wo contrast -2d echo w contrast -carotid dopplers -check hemoglobin A1c, lipid panel -wound culture, BCx x 2 -hold antihypertensives for permissive hypertension -repeat EKG -ASA 325 mg po daily -PT/OT/SLP -neuro checks q2h -cardiac monitoring  #Peripheral Neuropathy: Brad Sprigg describes a peripheral neuropathy with decreased sensaton in his hands and arms. He has history of DM2 but last A1c in our EMR 03/2008 was 5.9. His family says his A1c has been well controlled at the [place] but these records are unavailable. He is not on any diabetic medications. He also worked his  whole life as a Psychologist, sport and exercise and may have arthritis. -check hemoglobin a1c -cont home norco 5-325 q6hprn  #L hand pustule with cellulitis: Brad Daft has the pustule on his L 3rd metacarpal-phalangeal joint which expressed scant  serosanguinous fluid on pressure and surrounding erythema. No signs of systemic infection with 0/4 SIRS criteria. Wound culture obtained in ED. -f/u wound culture -BCx x 2 -doxycycline 100 mg bid  #Acute on CKD: Brad Broadhead's presenting creatinine was 4.27 with GFR 13. Previous GFR 19 in 07/2012. His BUN is improved at 34 today from 36-75 on 07/2012 admission. Unclear from history if he is making less urine. He previously said he does not want hemodialysis but says he is now more open minded -recheck BMP -250 cc bolus D5-1/2NS over 4 hr -consider nephrology  #DM2: Presenting blood sugar 108. Last hemoglobin A1c in EMR is 03/2008 at 5.9. He is not on any diabetic medications. His family says his A1c has been well controlled at the [place] but these records are unavailable. His distal extremity complaints may be related to diabetic neuropathy if DM2 has been poorly controlled in past. -check hemoglobin A1c -add SSI if needed  #Anemia: Presenting hemoglobin 10.8 with MCV low normal at 80 and schistocytes on smear. His hemoglobin on previous admission was ~ 11. As this appears stable and they want to transfer back to PCP care, we will hold off on labs unless actively bleeding. Last anemia panel 03/2008 with iron 61, TIBC 184, ferritin 177. He likely has anemia of chronic disease related to his chronic kidney disease. No colonoscopy on file. -recheck CBC in am -check LDH, anemia panel  -consider fecal occult blood card  #CHF: Likely non-contributory as he has no signs of fluid overload on exam. Last echo 07/2012 notable for LVEF 25-05%, grade 1 diastolic dysfunction, increased relative contribution of atrial contraction to ventricular filling, moderate tricuspid regurgitation. At home he  takes ASA 81 mg daily, metoprolol 50 mg daily.  -hold metoprolol in setting of permissive hypertension -continue ASA 81 mg daily  #COPD: Brad Mozingo is sating 100% on RA in no respiratory distress since presentation. At home he uses 2.5 L O2 at nights, spiriva 18 mcg inh daily, and albuterol 2 puff inh q6hprn. -continue spiriva 18 mcg inh daily, albuterol 2 puff q6hprn  #HTN: Brad Drach's BP has been in the 100s-130s/50s-80s. At home he uses ASA 81 mg daily, metoprolol 50 mg daily. -hold metoprolol in setting of permissive hypertension -continue ASA 81 mg daily  #Thrombocytopenia: Presenting platelet 110. On previous admission in 07/2012, his platelets ranged from 94-199 and was previously attributed to his critical illness but cause is unclear as it is persistent. He does not appear to have liver disease as AST and ALT wnl. -recheck CBC in am  #Psoriasis: Brad Guo has plaques on his b/l knees and R elbow. At home he uses flucinonide ointment bid -cont flucinonide ointment bid  #Prostate cancer s/p prostatectomy: Brad Todaro's family said he had a prostatectomy about ten years ago for prostate cancer. When asked if there were any issues since, they said he has a brain mass that the doctors are not concerned about. However, no brain mass was noted on head CT wo contrast. At home he is on terazosin 1 mg qhs -MRI/MRA head brain wo contrast -cont terazosin 1 mg qhs  #Diet: carb modified  #DVT PPx: heparin 5000 u Proctor tid  #Code: full  Dispo: Disposition is deferred at this time, awaiting improvement of current medical problems. Anticipated discharge in approximately 1-2 day(s).   The patient does have a current PCP in the [place] [facility] and does need an [facility] hospital follow-up appointment after discharge.  The patient does not know have  transportation limitations that hinder transportation to clinic appointments.  Signed: Kelby Aline, MD 02/22/2014, 10:21 PM

## 2014-02-22 NOTE — ED Notes (Addendum)
Per EMS, pt comes from home with c/o left sided weakness and fall. Pt fell on Saturday due to the weakness. Pt has h/o TIAs. Pt A&OX4, NAD noted. Pt did not hit head. Pt on 2L home O2. VSS. 22g IV placed in right AC. No meds given.

## 2014-02-23 ENCOUNTER — Ambulatory Visit (HOSPITAL_COMMUNITY): Payer: Medicare Other

## 2014-02-23 ENCOUNTER — Inpatient Hospital Stay (HOSPITAL_COMMUNITY): Payer: Medicare Other

## 2014-02-23 ENCOUNTER — Encounter (HOSPITAL_COMMUNITY): Payer: Self-pay | Admitting: Neurology

## 2014-02-23 DIAGNOSIS — E440 Moderate protein-calorie malnutrition: Secondary | ICD-10-CM | POA: Insufficient documentation

## 2014-02-23 DIAGNOSIS — J449 Chronic obstructive pulmonary disease, unspecified: Secondary | ICD-10-CM

## 2014-02-23 DIAGNOSIS — Z8546 Personal history of malignant neoplasm of prostate: Secondary | ICD-10-CM

## 2014-02-23 DIAGNOSIS — G459 Transient cerebral ischemic attack, unspecified: Secondary | ICD-10-CM

## 2014-02-23 DIAGNOSIS — G629 Polyneuropathy, unspecified: Secondary | ICD-10-CM

## 2014-02-23 DIAGNOSIS — L089 Local infection of the skin and subcutaneous tissue, unspecified: Secondary | ICD-10-CM

## 2014-02-23 DIAGNOSIS — N179 Acute kidney failure, unspecified: Secondary | ICD-10-CM

## 2014-02-23 DIAGNOSIS — I509 Heart failure, unspecified: Secondary | ICD-10-CM

## 2014-02-23 DIAGNOSIS — D696 Thrombocytopenia, unspecified: Secondary | ICD-10-CM

## 2014-02-23 DIAGNOSIS — E119 Type 2 diabetes mellitus without complications: Secondary | ICD-10-CM

## 2014-02-23 DIAGNOSIS — R531 Weakness: Secondary | ICD-10-CM

## 2014-02-23 DIAGNOSIS — D649 Anemia, unspecified: Secondary | ICD-10-CM

## 2014-02-23 DIAGNOSIS — L03114 Cellulitis of left upper limb: Secondary | ICD-10-CM

## 2014-02-23 DIAGNOSIS — I129 Hypertensive chronic kidney disease with stage 1 through stage 4 chronic kidney disease, or unspecified chronic kidney disease: Secondary | ICD-10-CM

## 2014-02-23 DIAGNOSIS — L409 Psoriasis, unspecified: Secondary | ICD-10-CM

## 2014-02-23 DIAGNOSIS — N189 Chronic kidney disease, unspecified: Secondary | ICD-10-CM

## 2014-02-23 LAB — BASIC METABOLIC PANEL
ANION GAP: 8 (ref 5–15)
Anion gap: 6 (ref 5–15)
BUN: 33 mg/dL — AB (ref 6–23)
BUN: 32 mg/dL — AB (ref 6–23)
CHLORIDE: 114 mmol/L — AB (ref 96–112)
CHLORIDE: 114 mmol/L — AB (ref 96–112)
CO2: 27 mmol/L (ref 19–32)
CO2: 25 mmol/L (ref 19–32)
Calcium: 8.8 mg/dL (ref 8.4–10.5)
Calcium: 9 mg/dL (ref 8.4–10.5)
Creatinine, Ser: 4.21 mg/dL — ABNORMAL HIGH (ref 0.50–1.35)
Creatinine, Ser: 4.24 mg/dL — ABNORMAL HIGH (ref 0.50–1.35)
GFR, EST AFRICAN AMERICAN: 14 mL/min — AB (ref >90)
GFR, EST AFRICAN AMERICAN: 14 mL/min — AB (ref >90)
GFR, EST NON AFRICAN AMERICAN: 12 mL/min — AB (ref >90)
GFR, EST NON AFRICAN AMERICAN: 12 mL/min — AB (ref >90)
Glucose, Bld: 142 mg/dL — ABNORMAL HIGH (ref 70–99)
Glucose, Bld: 93 mg/dL (ref 70–99)
POTASSIUM: 4.4 mmol/L (ref 3.5–5.1)
Potassium: 4.4 mmol/L (ref 3.5–5.1)
Sodium: 147 mmol/L — ABNORMAL HIGH (ref 135–145)
Sodium: 147 mmol/L — ABNORMAL HIGH (ref 135–145)

## 2014-02-23 LAB — TSH: TSH: 1.321 u[IU]/mL (ref 0.350–4.500)

## 2014-02-23 LAB — HEMOGLOBIN A1C
HEMOGLOBIN A1C: 5.5 % (ref <5.7)
Mean Plasma Glucose: 111 mg/dL (ref <117)

## 2014-02-23 LAB — CBC
HEMATOCRIT: 33.1 % — AB (ref 39.0–52.0)
Hemoglobin: 10.1 g/dL — ABNORMAL LOW (ref 13.0–17.0)
MCH: 25.2 pg — AB (ref 26.0–34.0)
MCHC: 30.5 g/dL (ref 30.0–36.0)
MCV: 82.5 fL (ref 78.0–100.0)
Platelets: 100 10*3/uL — ABNORMAL LOW (ref 150–400)
RBC: 4.01 MIL/uL — AB (ref 4.22–5.81)
RDW: 15.4 % (ref 11.5–15.5)
WBC: 7.5 10*3/uL (ref 4.0–10.5)

## 2014-02-23 LAB — LIPID PANEL
CHOLESTEROL: 151 mg/dL (ref 0–200)
HDL: 46 mg/dL (ref >39)
LDL Cholesterol: 88 mg/dL (ref 0–99)
Total CHOL/HDL Ratio: 3.3 RATIO
Triglycerides: 85 mg/dL (ref <150)
VLDL: 17 mg/dL (ref 0–40)

## 2014-02-23 LAB — MRSA PCR SCREENING: MRSA BY PCR: POSITIVE — AB

## 2014-02-23 LAB — URINE CULTURE: Colony Count: 30000

## 2014-02-23 LAB — GLUCOSE, CAPILLARY
GLUCOSE-CAPILLARY: 82 mg/dL (ref 70–99)
Glucose-Capillary: 93 mg/dL (ref 70–99)
Glucose-Capillary: 122 mg/dL — ABNORMAL HIGH (ref 70–99)

## 2014-02-23 LAB — FERRITIN: Ferritin: 115 ng/mL (ref 22–322)

## 2014-02-23 LAB — FOLATE: FOLATE: 8.5 ng/mL

## 2014-02-23 LAB — IRON AND TIBC
IRON: 32 ug/dL — AB (ref 42–165)
Saturation Ratios: 19 % — ABNORMAL LOW (ref 20–55)
TIBC: 170 ug/dL — ABNORMAL LOW (ref 215–435)
UIBC: 138 ug/dL (ref 125–400)

## 2014-02-23 LAB — LACTATE DEHYDROGENASE: LDH: 185 U/L (ref 94–250)

## 2014-02-23 LAB — VITAMIN B12: VITAMIN B 12: 400 pg/mL (ref 211–911)

## 2014-02-23 LAB — SAVE SMEAR

## 2014-02-23 MED ORDER — GLUCERNA SHAKE PO LIQD
237.0000 mL | Freq: Two times a day (BID) | ORAL | Status: DC
  Administered 2014-02-23 – 2014-03-02 (×13): 237 mL via ORAL

## 2014-02-23 MED ORDER — LORAZEPAM 2 MG/ML IJ SOLN
1.0000 mg | Freq: Once | INTRAMUSCULAR | Status: AC
  Administered 2014-02-23: 1 mg via INTRAVENOUS
  Filled 2014-02-23: qty 1

## 2014-02-23 MED ORDER — MUPIROCIN 2 % EX OINT
1.0000 "application " | TOPICAL_OINTMENT | Freq: Two times a day (BID) | CUTANEOUS | Status: AC
  Administered 2014-02-23 – 2014-02-27 (×10): 1 via NASAL
  Filled 2014-02-23: qty 22

## 2014-02-23 MED ORDER — CHLORHEXIDINE GLUCONATE CLOTH 2 % EX PADS
6.0000 | MEDICATED_PAD | Freq: Every day | CUTANEOUS | Status: AC
  Administered 2014-02-23 – 2014-02-24 (×2): 6 via TOPICAL

## 2014-02-23 MED ORDER — ADULT MULTIVITAMIN W/MINERALS CH
1.0000 | ORAL_TABLET | Freq: Every day | ORAL | Status: DC
  Administered 2014-02-23 – 2014-03-02 (×8): 1 via ORAL
  Filled 2014-02-23 (×8): qty 1

## 2014-02-23 MED ORDER — LORAZEPAM 2 MG/ML IJ SOLN
0.5000 mg | Freq: Once | INTRAMUSCULAR | Status: AC
  Administered 2014-02-23: 0.5 mg via INTRAVENOUS
  Filled 2014-02-23: qty 1

## 2014-02-23 NOTE — Evaluation (Signed)
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 H says that he has had worsening L sided weakness of his arm and leg for the past two weeks. He had an unwitnessed fall on Friday 1/22.  According to MRI of head:  No acute infarct.   Clinical Impression   Patient mod I with mobility and required up to min assist for LB ADLs PTA. Patient currently functioning at up to a total assist level for ADLs. Patient will benefit from acute OT to increase overall independence in the areas of ADLs, functional mobility, and safety in order to safely discharge to venue listed below.     Follow Up Recommendations  CIR;Supervision/Assistance - 24 hour    Equipment Recommendations   (TBD)    Recommendations for Other Services Rehab consult     Precautions / Restrictions Precautions Precautions: Fall Restrictions Weight Bearing Restrictions: No      Mobility Bed Mobility Overal bed mobility: Needs Assistance Bed Mobility: Rolling;Supine to Sit Rolling: Min assist   Supine to sit: Mod assist     General bed mobility comments: Using bed rails and with HOB elevated.   Transfers Overall transfer level: Needs assistance Equipment used: Rolling walker (2 wheeled) Transfers: Sit to/from Stand Sit to Stand: Mod assist         General transfer comment: Cues required for technique/hand placement and overall safety    Balance Overall balance assessment: Needs assistance Sitting-balance support: Feet supported Sitting balance-Leahy Scale: Fair     Standing balance support: Bilateral upper extremity supported Standing balance-Leahy Scale: Poor     ADL Overall ADL's : Needs assistance/impaired     Grooming:  Minimal assistance;Sitting   Upper Body Bathing: Sitting;Moderate assistance   Lower Body Bathing: Total assistance;Sit to/from stand   Upper Body Dressing : Sitting;Moderate assistance   Lower Body Dressing: Total assistance;Sit to/from stand;Cueing for safety   Toilet Transfer: Moderate assistance;Ambulation;RW           Functional mobility during ADLs: Minimal assistance;Moderate assistance (min->mod assist) General ADL Comments: Patient with ataxic like movements during functional mobility and transfer OOB. Patient with decreased strength throughout LUE causing decreased independence with ADLs. Patient reports his wife was assisting with LB ADLs PTA.      Vision  Patient reports he wears glasses for reading. No change in baseline vision per his report. No apparent deficits.    Perception Perception Perception Tested?: No   Praxis Praxis Praxis tested?: Within functional limits    Pertinent Vitals/Pain Pain Assessment: 0-10 Pain Score: 8  Pain Location: back and stomach Pain Descriptors / Indicators: Throbbing Pain Intervention(s): Monitored during session;Repositioned     Hand Dominance Right   Extremity/Trunk Assessment Upper Extremity Assessment Upper Extremity Assessment: LUE deficits/detail;RUE deficits/detail RUE Deficits / Details: decreased ROM LUE Deficits / Details: decreased ROM and strength throughout LUE LUE Sensation: decreased light touch;decreased proprioception LUE Coordination: decreased fine motor;decreased gross motor   Lower Extremity Assessment Lower Extremity Assessment: Defer to PT evaluation       Communication Communication Communication: HOH   Cognition Arousal/Alertness: Awake/alert Behavior During Therapy: WFL for tasks assessed/performed Overall Cognitive Status: Within Functional Limits for tasks assessed  Home Living Family/patient expects to be discharged to:: Private residence Living Arrangements:  Spouse/significant other Available Help at Discharge: Family;Available 24 hours/day Type of Home: House Home Access: Stairs to enter [employer] of Steps: 3 Entrance Stairs-Rails: None Home Layout: One level     Bathroom Shower/Tub: Walk-in Hydrologist: Handicapped height Bathroom Accessibility: Yes How Accessible: Accessible via walker Home Equipment: [place] - 2 wheels;Shower seat;Grab bars - tub/shower;Cane - single point          Prior Functioning/Environment Level of Independence: Needs assistance;Independent with assistive device(s)  Gait / Transfers Assistance Needed: mod I ADL's / Homemaking Assistance Needed: min assist, patient reports his wife assists with LB ADLs Communication / Swallowing Assistance Needed: independent      OT Diagnosis: Generalized weakness;Ataxia   OT Problem List: Decreased strength;Decreased range of motion;Decreased activity tolerance;Impaired balance (sitting and/or standing);Decreased coordination;Decreased safety awareness;Decreased knowledge of use of DME or AE;Decreased knowledge of precautions;Impaired UE functional use;Pain   OT Treatment/Interventions: Self-care/ADL training;Therapeutic exercise;Neuromuscular education;Energy conservation;DME and/or AE instruction;Therapeutic activities;Patient/family education;Balance training    OT Goals(Current goals can be found in the care plan section) Acute Rehab OT Goals Patient Stated Goal: get stronger OT Goal Formulation: With patient Time For Goal Achievement: 03/09/14 Potential to Achieve Goals: Good ADL Goals Pt Will Perform Grooming: with set-up;sitting Pt Will Perform Upper Body Bathing: with set-up;sitting Pt Will Perform Lower Body Bathing: with mod assist;sit to/from stand Pt Will Perform Upper Body Dressing: with set-up;sitting Pt Will Perform Lower Body Dressing: with mod assist;sit to/from stand Pt Will Transfer to Toilet: with min  assist;ambulating Pt/caregiver will Perform Home Exercise Program: With written HEP provided;With Supervision;Increased strength;Increased ROM;Both right and left upper extremity  OT Frequency: Min 2X/week   Barriers to D/C: None known at this time          End of Session Equipment Utilized During Treatment: Gait belt;Rolling walker Nurse Communication: Mobility status  Activity Tolerance: Patient tolerated treatment well Patient left: in chair;with call bell/phone within reach;with nursing/sitter in room   Time: 3149-7026 OT Time Calculation (min): 30 min Charges:  OT General Charges $OT Visit: 1 Procedure OT Evaluation $Initial OT Evaluation Tier I: 1 Procedure OT Treatments $Therapeutic Activity: 8-22 mins  Prima Rayner , MS, OTR/L, CLT Pager: [phone]  02/23/2014, 9:52 AM

## 2014-02-23 NOTE — Progress Notes (Signed)
Patient positive for MRSA in nares. RN notified by lab. Burnell Blanks, RN

## 2014-02-23 NOTE — Consult Note (Signed)
NEURO HOSPITALIST CONSULT NOTE    Reason for Consult: 4 week history of increased weakness noted in previously plegic side.   HPI:                                                                                                                                          Brad Jones is an 81 y.o. male with previous stroke and residual left sided weakness in arm and leg. Patient has noted over the last 4 weeks he had decreased sensation in his left hand.  Over a period of one week he feels the decreased sensation spread to both hands.  He also noted he started to feel cold and needed more cloths than usual to keep warm. He now feel that over the course of 4 weeks he has become generally weaker and noted the previously plegic sided has also become "slightly weaker".  In previous noted there is mention that he had a fall.  In speaking with patient he denies a fall.  He states he was sitting at the edge of his bed when he attempted to get up and slid to the floor.  He at that time could not get up from the floor.   At baseline he states he can walk in the rooms of his house without a cane but if he goes out  He often uses a wheel chair.  He is able to go up stairs with use of a cane.   MRI brain shows no acute infarct, TSH normal, B12 normal,   Past Medical History  Diagnosis Date  . Hypertension   . CHF (congestive heart failure)   . COPD (chronic obstructive pulmonary disease)     use 2 1/2 L oxygen at nights  . Emphysema   . Diabetes mellitus without complication     non insulin dependent  . Cancer     prostate  . Renal disorder kidneys only function at 20%    pt does not want dialysis  . Stroke     TIA, multiple    Past Surgical History  Procedure Laterality Date  . Prostatectomy      Family History  Problem Relation Age of Onset  . Hypertension Mother   . Hypertension Father      Social History:  reports that he quit smoking about 23 months ago. He has  never used smokeless tobacco. He reports that he does not drink alcohol. His drug history is not on file.  No Known Allergies  MEDICATIONS:  I have reviewed the patient's current medications. Scheduled: . aspirin EC  81 mg Oral Daily  . [START ON 02/24/2014] calcitRIOL  0.25 mcg Oral Q M,W,F  . Chlorhexidine Gluconate Cloth  6 each Topical Q0600  . docusate sodium  100 mg Oral QHS  . doxycycline  100 mg Oral Q12H  . feeding supplement (GLUCERNA SHAKE)  237 mL Oral BID  . fluocinonide ointment  1 application Topical BID  . heparin  5,000 Units Subcutaneous 3 times per day  . multivitamin with minerals  1 tablet Oral Daily  . mupirocin ointment  1 application Nasal BID  . polycarbophil  1,250 mg Oral Daily  . terazosin  1 mg Oral QHS  . tiotropium  18 mcg Inhalation Daily     ROS:                                                                                                                                       History obtained from the patient  General ROS: negative for - chills, fatigue, fever, night sweats, weight gain or weight loss Psychological ROS: negative for - behavioral disorder, hallucinations, memory difficulties, mood swings or suicidal ideation Ophthalmic ROS: negative for - blurry vision, double vision, eye pain or loss of vision ENT ROS: negative for - epistaxis, nasal discharge, oral lesions, sore throat, tinnitus or vertigo Allergy and Immunology ROS: negative for - hives or itchy/watery eyes Hematological and Lymphatic ROS: negative for - bleeding problems, bruising or swollen lymph nodes Endocrine ROS: negative for - galactorrhea, hair pattern changes, polydipsia/polyuria or temperature intolerance Respiratory ROS: negative for - cough, hemoptysis, shortness of breath or wheezing Cardiovascular ROS: negative for - chest pain, dyspnea on exertion,  edema or irregular heartbeat Gastrointestinal ROS: negative for - abdominal pain, diarrhea, hematemesis, nausea/vomiting or stool incontinence Genito-Urinary ROS: negative for - dysuria, hematuria, incontinence or urinary frequency/urgency Musculoskeletal ROS: negative for - joint swelling or muscular weakness Neurological ROS: as noted in HPI Dermatological ROS: negative for rash and skin lesion changes   Blood pressure 118/62, pulse 74, temperature 98.2 F (36.8 C), temperature source Oral, resp. rate 22, height 5\' 9"  (1.753 m), weight 75.6 kg (166 lb 10.7 oz), SpO2 100 %.   Neurologic Examination:                                                                                                      HEENT-  Normocephalic, no lesions, without obvious abnormality.  Normal external eye and conjunctiva.  Normal TM's bilaterally.  Normal auditory canals and external ears. Normal external nose, mucus membranes and septum.  Normal pharynx. Cardiovascular- S1, S2 normal, pulses palpable throughout   Lungs- no tachypnea, retractions or cyanosis Abdomen- normal findings: bowel sounds normal Extremities- no edema Lymph-no adenopathy palpable Musculoskeletal-no joint tenderness, deformity or swelling Skin-warm and dry, no hyperpigmentation, vitiligo, or suspicious lesions  Neurological Examination Mental Status: Alert, oriented, thought content appropriate.  Speech fluent without evidence of aphasia.  Able to follow 3 step commands without difficulty. Cranial Nerves: II: Discs flat bilaterally; Visual fields grossly normal, pupils equal, round, reactive to light and accommodation III,IV, VI: ptosis not present, extra-ocular motions intact bilaterally V,VII: smile symmetric, facial light touch sensation normal bilaterally VIII: hearing normal bilaterally IX,X: gag reflex present XI: bilateral shoulder shrug XII: midline tongue extension Motor: 4/5 strength throughout but does show to be slightly  weaker on the left arm and leg (rsidual from previous stroke) Tone and bulk:normal tone throughout; no atrophy noted Sensory: Pinprick and light touch intact throughout with decreased sensation in the LE from feet to mid shin, decreased proprioception in LE.  Deep Tendon Reflexes: 2+ and symmetric throughout with no AJ Plantars: Right: downgoing   Left: up going Cerebellar: normal finger-to-nose, and normal heel-to-shin test Gait: not tested due to safety.       Lab Results: Basic Metabolic Panel:  Recent Labs Lab 02/22/14 1355 02/22/14 2250 02/23/14 0735  NA 146* 147* 147*  K 4.4 4.4 4.4  CL 112 114* 114*  CO2 28 27 25   GLUCOSE 108* 142* 93  BUN 34* 33* 32*  CREATININE 4.27* 4.21* 4.24*  CALCIUM 9.5 8.8 9.0    Liver Function Tests:  Recent Labs Lab 02/22/14 1355  AST 31  ALT 11  ALKPHOS 55  BILITOT 0.6  PROT 5.7*  ALBUMIN 2.9*   No results for input(s): LIPASE, AMYLASE in the last 168 hours. No results for input(s): AMMONIA in the last 168 hours.  CBC:  Recent Labs Lab 02/22/14 1355 02/23/14 0735  WBC 9.5 7.5  NEUTROABS 7.5  --   HGB 10.8* 10.1*  HCT 34.9* 33.1*  MCV 80.4 82.5  PLT 110* 100*    Cardiac Enzymes:  Recent Labs Lab 02/22/14 1355  TROPONINI <0.03    Lipid Panel:  Recent Labs Lab 02/23/14 0735  CHOL 151  TRIG 85  HDL 46  CHOLHDL 3.3  VLDL 17  LDLCALC 88    CBG:  Recent Labs Lab 02/22/14 2001 02/22/14 2320 02/23/14 0652 02/23/14 1138  GLUCAP 76 123* 82 35    Microbiology: Results for orders placed or performed during the hospital encounter of 02/22/14  Wound culture     Status: None (Preliminary result)   Collection Time: 02/22/14 11:31 PM  Result Value Ref Range Status   Specimen Description WOUND LEFT HAND  Final   Special Requests NONE  Final   Gram Stain   Final    NO WBC SEEN NO ORGANISMS SEEN Performed at [facility]    Culture PENDING  Incomplete   Report Status PENDING  Incomplete   MRSA PCR Screening     Status: Abnormal   Collection Time: 02/23/14 12:02 AM  Result Value Ref Range Status   MRSA by PCR POSITIVE (A) NEGATIVE Final    Comment:        The GeneXpert MRSA Assay (FDA approved for NASAL specimens only), is one component of a comprehensive MRSA colonization surveillance program. It is not intended to diagnose  MRSA infection nor to guide or monitor treatment for MRSA infections. RESULT CALLED TO, READ BACK BY AND VERIFIED WITH: BASS,S RN 0304 02/23/14 MITCHELL,L     Coagulation Studies: No results for input(s): LABPROT, INR in the last 72 hours.  Imaging: Dg Chest 2 View  02/22/2014   CLINICAL DATA:  Short of breath.  Initial encounter.  EXAM: CHEST  2 VIEW  COMPARISON:  08/15/2012.  FINDINGS: The cardiopericardial silhouette is within normal limits for size. Tortuous thoracic aorta with arch atherosclerosis. Pulmonary parenchymal scarring is present, most prominent at the bases. There is no airspace disease. No pleural effusion is identified. Suboptimal lateral view because the patient was unable to raise arms overhead. Partially translucent monitoring buttons are projected over the chest.  IMPRESSION: No acute cardiopulmonary disease.   Electronically Signed   By: Dereck Ligas M.D.   On: 02/22/2014 15:51   Ct Head Wo Contrast  02/22/2014   CLINICAL DATA:  Status post fall 02/20/2014 due to left-side weakness.  EXAM: CT HEAD WITHOUT CONTRAST  TECHNIQUE: Contiguous axial images were obtained from the base of the skull through the vertex without intravenous contrast.  COMPARISON:  Head CT scan 08/09/2012.  FINDINGS: Atrophy and chronic microvascular ischemic change are identified. No evidence of acute intracranial abnormality including hemorrhage, infarct, mass lesion, mass effect, midline shift or abnormal extra-axial fluid collection is identified. There is no hydrocephalus or pneumocephalus. The calvarium is intact.  IMPRESSION: No acute abnormality.   Atrophy and chronic microvascular ischemic change.   Electronically Signed   By: Inge Rise M.D.   On: 02/22/2014 14:34   Mr Brain Wo Contrast  02/23/2014   CLINICAL DATA:  81 year old diabetic hypertensive male with multiple TIAs with residual left-sided weakness. The patient states worsening left-sided weakness involving arm and leg for the past 2 weeks. Unwitnessed fall 02/19/2014.  EXAM: MRI HEAD WITHOUT CONTRAST  MRA HEAD WITHOUT CONTRAST  TECHNIQUE: Multiplanar, multiecho pulse sequences of the brain and surrounding structures were obtained without intravenous contrast. Angiographic images of the head were obtained using MRA technique without contrast.  COMPARISON:  02/22/2014 and 08/09/2012 CT.  No comparison MR.  FINDINGS: MRI HEAD FINDINGS  Exam is motion degraded.  No acute infarct.  No intracranial hemorrhage.  Prominent small vessel disease type changes. In the right frontal lobe, findings suggestive of remote infarct with encephalomalacia. The lack of change since 2014 suggests that this does not represent an underlying mass.  Global atrophy. Ventricular prominence felt to be related to atrophy rather than hydrocephalus.  Transverse ligament hypertrophy with mild compression of the upper cervical spine. Cervical spondylotic changes C2-3 with spinal stenosis and mild cord flattening.  Exophthalmos.  Major intracranial vascular structures are patent. Ectatic basilar artery impresses upon and superiorly displaces the hypothalamus.  MRA HEAD FINDINGS  Anterior circulation without medium or large size vessel significant stenosis or occlusion.  Moderate narrowing and irregularity middle cerebral artery branch vessels and A2 segment anterior cerebral artery bilaterally.  Ectatic vertebral arteries and basilar artery. Right vertebral artery is dominant.  Poor delineation of the posterior inferior cerebellar artery, anterior inferior cerebellar artery and superior cerebellar artery beyond proximal  aspect bilaterally.  Poor delineation posterior cerebral artery distal branches bilaterally.  No aneurysm noted.  IMPRESSION: MRI HEAD  No acute infarct.  Prominent small vessel disease type changes. In the right frontal lobe, findings suggestive of remote infarct with encephalomalacia.  Global atrophy.  Transverse ligament hypertrophy with mild compression of the upper cervical spine. Cervical  spondylotic changes C2-3 with spinal stenosis and mild cord flattening.  Exophthalmos.  MRA HEAD FINDINGS  Branch vessel intracranial atherosclerotic type changes as noted above.   Electronically Signed   By: Chauncey Cruel M.D.   On: 02/23/2014 06:32   Mr Jodene Nam Head/brain Wo Cm  02/23/2014   CLINICAL DATA:  81 year old diabetic hypertensive male with multiple TIAs with residual left-sided weakness. The patient states worsening left-sided weakness involving arm and leg for the past 2 weeks. Unwitnessed fall 02/19/2014.  EXAM: MRI HEAD WITHOUT CONTRAST  MRA HEAD WITHOUT CONTRAST  TECHNIQUE: Multiplanar, multiecho pulse sequences of the brain and surrounding structures were obtained without intravenous contrast. Angiographic images of the head were obtained using MRA technique without contrast.  COMPARISON:  02/22/2014 and 08/09/2012 CT.  No comparison MR.  FINDINGS: MRI HEAD FINDINGS  Exam is motion degraded.  No acute infarct.  No intracranial hemorrhage.  Prominent small vessel disease type changes. In the right frontal lobe, findings suggestive of remote infarct with encephalomalacia. The lack of change since 2014 suggests that this does not represent an underlying mass.  Global atrophy. Ventricular prominence felt to be related to atrophy rather than hydrocephalus.  Transverse ligament hypertrophy with mild compression of the upper cervical spine. Cervical spondylotic changes C2-3 with spinal stenosis and mild cord flattening.  Exophthalmos.  Major intracranial vascular structures are patent. Ectatic basilar artery  impresses upon and superiorly displaces the hypothalamus.  MRA HEAD FINDINGS  Anterior circulation without medium or large size vessel significant stenosis or occlusion.  Moderate narrowing and irregularity middle cerebral artery branch vessels and A2 segment anterior cerebral artery bilaterally.  Ectatic vertebral arteries and basilar artery. Right vertebral artery is dominant.  Poor delineation of the posterior inferior cerebellar artery, anterior inferior cerebellar artery and superior cerebellar artery beyond proximal aspect bilaterally.  Poor delineation posterior cerebral artery distal branches bilaterally.  No aneurysm noted.  IMPRESSION: MRI HEAD  No acute infarct.  Prominent small vessel disease type changes. In the right frontal lobe, findings suggestive of remote infarct with encephalomalacia.  Global atrophy.  Transverse ligament hypertrophy with mild compression of the upper cervical spine. Cervical spondylotic changes C2-3 with spinal stenosis and mild cord flattening.  Exophthalmos.  MRA HEAD FINDINGS  Branch vessel intracranial atherosclerotic type changes as noted above.   Electronically Signed   By: Chauncey Cruel M.D.   On: 02/23/2014 06:32       Assessment and plan per attending neurologist  Etta Quill PA-C [facility] [phone]  02/23/2014, 2:09 PM   Assessment/Plan: 81 yo M with generalized weakness progressive over the past few weeks. His MRI does not show stroke, but C-spine pathology could present with quadriparesis, though he does not clearly have myelopathic findings. I would favor further evaluation with an MRI. Myopathies can cause this progression of weakness as well and a CK may be helpful.   1) MRI C-spine 2) If this is negative, then I would favor further evaluation as an outpatient with EMG looking for myopathic findings.  3) ck  Roland Rack, MD [facility] [phone]  If 7pm- 7am, please page neurology on call as listed in  [place].

## 2014-02-23 NOTE — Progress Notes (Signed)
Subjective: Pt states he is still weak on his rt side. Has no other complaints. Waiting for daughter to see him to discuss whether he wants to stay at here at [facility] or transfer to the [place].  Objective: Vital signs in last 24 hours: Filed Vitals:   02/23/14 0300 02/23/14 0511 02/23/14 0833 02/23/14 1032  BP: 114/66 110/57 115/59 118/62  Pulse: 77 82 83 74  Temp: 98.3 F (36.8 C) 98.3 F (36.8 C) 98.8 F (37.1 C) 98.2 F (36.8 C)  TempSrc: Oral Oral Oral Oral  Resp: 18 18 20 22   Height:      Weight:      SpO2: 100% 100% 100%    Weight change:   Intake/Output Summary (Last 24 hours) at 02/23/14 1228 Last data filed at 02/23/14 0834  Gross per 24 hour  Intake    360 ml  Output      0 ml  Net    360 ml   General: NAD, pleasant, sitting in recliner at bedside.  Lungs: CTAB, no wheezing Cardiac: RRR, no murmurs GI: soft, active bowel sounds Neuro: rt side 5/5 strength, lt side 4/5 strength, AAOx3  Lab Results: Basic Metabolic Panel:  Recent Labs Lab 02/22/14 2250 02/23/14 0735  NA 147* 147*  K 4.4 4.4  CL 114* 114*  CO2 27 25  GLUCOSE 142* 93  BUN 33* 32*  CREATININE 4.21* 4.24*  CALCIUM 8.8 9.0   Liver Function Tests:  Recent Labs Lab 02/22/14 1355  AST 31  ALT 11  ALKPHOS 55  BILITOT 0.6  PROT 5.7*  ALBUMIN 2.9*   CBC:  Recent Labs Lab 02/22/14 1355 02/23/14 0735  WBC 9.5 7.5  NEUTROABS 7.5  --   HGB 10.8* 10.1*  HCT 34.9* 33.1*  MCV 80.4 82.5  PLT 110* 100*   Cardiac Enzymes:  Recent Labs Lab 02/22/14 1355  TROPONINI <0.03   CBG:  Recent Labs Lab 02/22/14 2001 02/22/14 2320 02/23/14 0652 02/23/14 1138  GLUCAP 76 123* 82 93   Fasting Lipid Panel:  Recent Labs Lab 02/23/14 0735  CHOL 151  HDL 46  LDLCALC 88  TRIG 85  CHOLHDL 3.3   Thyroid Function Tests:  Recent Labs Lab 02/23/14 0735  TSH 1.321   Anemia Panel:  Recent Labs Lab 02/22/14 2250 02/23/14 0014  VITAMINB12  --  400  FOLATE  --  8.5  FERRITIN   --  115  TIBC  --  170*  IRON  --  32*  RETICCTPCT 1.0  --    Urinalysis:  Recent Labs Lab 02/22/14 1349  COLORURINE YELLOW  LABSPEC 1.017  PHURINE 5.0  GLUCOSEU NEGATIVE  HGBUR MODERATE*  BILIRUBINUR NEGATIVE  KETONESUR NEGATIVE  PROTEINUR 30*  UROBILINOGEN 0.2  NITRITE NEGATIVE  LEUKOCYTESUR NEGATIVE    Micro Results: Recent Results (from the past 240 hour(s))  Wound culture     Status: None (Preliminary result)   Collection Time: 02/22/14 11:31 PM  Result Value Ref Range Status   Specimen Description WOUND LEFT HAND  Final   Special Requests NONE  Final   Gram Stain   Final    NO WBC SEEN NO ORGANISMS SEEN Performed at [facility]    Culture PENDING  Incomplete   Report Status PENDING  Incomplete  MRSA PCR Screening     Status: Abnormal   Collection Time: 02/23/14 12:02 AM  Result Value Ref Range Status   MRSA by PCR POSITIVE (A) NEGATIVE Final    Comment:  The GeneXpert MRSA Assay (FDA approved for NASAL specimens only), is one component of a comprehensive MRSA colonization surveillance program. It is not intended to diagnose MRSA infection nor to guide or monitor treatment for MRSA infections. RESULT CALLED TO, READ BACK BY AND VERIFIED WITH: BASS,S RN 0304 02/23/14 MITCHELL,L    Studies/Results: Dg Chest 2 View  02/22/2014   CLINICAL DATA:  Short of breath.  Initial encounter.  EXAM: CHEST  2 VIEW  COMPARISON:  08/15/2012.  FINDINGS: The cardiopericardial silhouette is within normal limits for size. Tortuous thoracic aorta with arch atherosclerosis. Pulmonary parenchymal scarring is present, most prominent at the bases. There is no airspace disease. No pleural effusion is identified. Suboptimal lateral view because the patient was unable to raise arms overhead. Partially translucent monitoring buttons are projected over the chest.  IMPRESSION: No acute cardiopulmonary disease.   Electronically Signed   By: Dereck Ligas M.D.   On:  02/22/2014 15:51   Ct Head Wo Contrast  02/22/2014   CLINICAL DATA:  Status post fall 02/20/2014 due to left-side weakness.  EXAM: CT HEAD WITHOUT CONTRAST  TECHNIQUE: Contiguous axial images were obtained from the base of the skull through the vertex without intravenous contrast.  COMPARISON:  Head CT scan 08/09/2012.  FINDINGS: Atrophy and chronic microvascular ischemic change are identified. No evidence of acute intracranial abnormality including hemorrhage, infarct, mass lesion, mass effect, midline shift or abnormal extra-axial fluid collection is identified. There is no hydrocephalus or pneumocephalus. The calvarium is intact.  IMPRESSION: No acute abnormality.  Atrophy and chronic microvascular ischemic change.   Electronically Signed   By: Inge Rise M.D.   On: 02/22/2014 14:34   Mr Brain Wo Contrast  02/23/2014   CLINICAL DATA:  81 year old diabetic hypertensive male with multiple TIAs with residual left-sided weakness. The patient states worsening left-sided weakness involving arm and leg for the past 2 weeks. Unwitnessed fall 02/19/2014.  EXAM: MRI HEAD WITHOUT CONTRAST  MRA HEAD WITHOUT CONTRAST  TECHNIQUE: Multiplanar, multiecho pulse sequences of the brain and surrounding structures were obtained without intravenous contrast. Angiographic images of the head were obtained using MRA technique without contrast.  COMPARISON:  02/22/2014 and 08/09/2012 CT.  No comparison MR.  FINDINGS: MRI HEAD FINDINGS  Exam is motion degraded.  No acute infarct.  No intracranial hemorrhage.  Prominent small vessel disease type changes. In the right frontal lobe, findings suggestive of remote infarct with encephalomalacia. The lack of change since 2014 suggests that this does not represent an underlying mass.  Global atrophy. Ventricular prominence felt to be related to atrophy rather than hydrocephalus.  Transverse ligament hypertrophy with mild compression of the upper cervical spine. Cervical spondylotic  changes C2-3 with spinal stenosis and mild cord flattening.  Exophthalmos.  Major intracranial vascular structures are patent. Ectatic basilar artery impresses upon and superiorly displaces the hypothalamus.  MRA HEAD FINDINGS  Anterior circulation without medium or large size vessel significant stenosis or occlusion.  Moderate narrowing and irregularity middle cerebral artery branch vessels and A2 segment anterior cerebral artery bilaterally.  Ectatic vertebral arteries and basilar artery. Right vertebral artery is dominant.  Poor delineation of the posterior inferior cerebellar artery, anterior inferior cerebellar artery and superior cerebellar artery beyond proximal aspect bilaterally.  Poor delineation posterior cerebral artery distal branches bilaterally.  No aneurysm noted.  IMPRESSION: MRI HEAD  No acute infarct.  Prominent small vessel disease type changes. In the right frontal lobe, findings suggestive of remote infarct with encephalomalacia.  Global atrophy.  Transverse  ligament hypertrophy with mild compression of the upper cervical spine. Cervical spondylotic changes C2-3 with spinal stenosis and mild cord flattening.  Exophthalmos.  MRA HEAD FINDINGS  Branch vessel intracranial atherosclerotic type changes as noted above.   Electronically Signed   By: Chauncey Cruel M.D.   On: 02/23/2014 06:32   Mr Jodene Nam Head/brain Wo Cm  02/23/2014   CLINICAL DATA:  81 year old diabetic hypertensive male with multiple TIAs with residual left-sided weakness. The patient states worsening left-sided weakness involving arm and leg for the past 2 weeks. Unwitnessed fall 02/19/2014.  EXAM: MRI HEAD WITHOUT CONTRAST  MRA HEAD WITHOUT CONTRAST  TECHNIQUE: Multiplanar, multiecho pulse sequences of the brain and surrounding structures were obtained without intravenous contrast. Angiographic images of the head were obtained using MRA technique without contrast.  COMPARISON:  02/22/2014 and 08/09/2012 CT.  No comparison MR.   FINDINGS: MRI HEAD FINDINGS  Exam is motion degraded.  No acute infarct.  No intracranial hemorrhage.  Prominent small vessel disease type changes. In the right frontal lobe, findings suggestive of remote infarct with encephalomalacia. The lack of change since 2014 suggests that this does not represent an underlying mass.  Global atrophy. Ventricular prominence felt to be related to atrophy rather than hydrocephalus.  Transverse ligament hypertrophy with mild compression of the upper cervical spine. Cervical spondylotic changes C2-3 with spinal stenosis and mild cord flattening.  Exophthalmos.  Major intracranial vascular structures are patent. Ectatic basilar artery impresses upon and superiorly displaces the hypothalamus.  MRA HEAD FINDINGS  Anterior circulation without medium or large size vessel significant stenosis or occlusion.  Moderate narrowing and irregularity middle cerebral artery branch vessels and A2 segment anterior cerebral artery bilaterally.  Ectatic vertebral arteries and basilar artery. Right vertebral artery is dominant.  Poor delineation of the posterior inferior cerebellar artery, anterior inferior cerebellar artery and superior cerebellar artery beyond proximal aspect bilaterally.  Poor delineation posterior cerebral artery distal branches bilaterally.  No aneurysm noted.  IMPRESSION: MRI HEAD  No acute infarct.  Prominent small vessel disease type changes. In the right frontal lobe, findings suggestive of remote infarct with encephalomalacia.  Global atrophy.  Transverse ligament hypertrophy with mild compression of the upper cervical spine. Cervical spondylotic changes C2-3 with spinal stenosis and mild cord flattening.  Exophthalmos.  MRA HEAD FINDINGS  Branch vessel intracranial atherosclerotic type changes as noted above.   Electronically Signed   By: Chauncey Cruel M.D.   On: 02/23/2014 06:32   Medications: I have reviewed the patient's current medications. Scheduled Meds: . aspirin  EC  81 mg Oral Daily  . [START ON 02/24/2014] calcitRIOL  0.25 mcg Oral Q M,W,F  . Chlorhexidine Gluconate Cloth  6 each Topical Q0600  . docusate sodium  100 mg Oral QHS  . doxycycline  100 mg Oral Q12H  . feeding supplement (GLUCERNA SHAKE)  237 mL Oral BID  . fluocinonide ointment  1 application Topical BID  . heparin  5,000 Units Subcutaneous 3 times per day  . multivitamin with minerals  1 tablet Oral Daily  . mupirocin ointment  1 application Nasal BID  . polycarbophil  1,250 mg Oral Daily  . terazosin  1 mg Oral QHS  . tiotropium  18 mcg Inhalation Daily   Continuous Infusions:  PRN Meds:.albuterol, HYDROcodone-acetaminophen Assessment/Plan: Principal Problem:   Weakness Active Problems:   Acute renal failure   Chronic kidney disease (CKD), stage IV (severe)   COPD (chronic obstructive pulmonary disease)   Thrombocytopenia   Anemia  Hand lesion   Hypertension   Malnutrition of moderate degree   #Weakness: Mr Keesling does have a history of several TIAs, dates unknown. His head CT was negative for any acute intracranial process but TIA still possible. Will continue TIA workup. -neurology consulted, appreciate their recommendations -MRI/MRA head brain wo contrast neg for acute infarct, remote rt frontal infarct w/ encephalomalacia, transverse ligament hypertrophy w/ mild compression of the upper cervical spine, cervical spondylotic changes in C2-3 w/ spinal stenosis and mild cord flattening. -2d echo w contrast read pending -carotid dopplers ordred -ordered hemoglobin A1c, lipid panel WNL -hold antihypertensives for permissive hypertension -ASA 325 mg po daily -PT/OT/SLP -potential candidate for CIR as has worked with them in 2014 -neuro checks q2h  #Peripheral Neuropathy: Mr Zeiss describes a peripheral neuropathy with decreased sensaton in his hands and arms. He has history of DM2 but last A1c in our EMR 03/2008 was 5.9. His family says his A1c has been well controlled  at the [place] but these records are unavailable. He is not on any diabetic medications. He also worked his whole life as a Psychologist, sport and exercise and may have arthritis. -hemoglobin a1c pending -cont home norco 5-325 q6hprn  #L hand pustule with cellulitis: Mr Treanor has the pustule on his L 3rd metacarpal-phalangeal joint which expressed scant serosanguinous fluid on pressure and surrounding erythema. No signs of systemic infection with 0/4 SIRS criteria.  -wound culture NGTD -BCx x 2 pending -doxycycline 100 mg bid  #Acute on CKD - Cr 4.24 this morning from 4.21 yesterday, unclear what new b/l creatinine is.  -can have pt follow up with outpatient nephrology  #DM2: Presenting blood sugar 108. Last hemoglobin A1c in EMR is 03/2008 at 5.9. He is not on any diabetic medications. His family says his A1c has been well controlled at the [place] but these records are unavailable. His distal extremity complaints may be related to diabetic neuropathy if DM2 has been poorly controlled in past. -hemoglobin A1c pending -add SSI if needed, CBGs WNL  #Anemia:  -hgb 10.1 from 10.8 on admission, will continue to monitor -LDH 185 WNL - anemia panel this morning consistent w/ anemia of chronic disease -fecal occult blood card ordered  #CHF: Likely non-contributory as he has no signs of fluid overload on exam. Last echo 07/2012 notable for LVEF 85-27%, grade 1 diastolic dysfunction, increased relative contribution of atrial contraction to ventricular filling, moderate tricuspid regurgitation. At home he takes ASA 81 mg daily, metoprolol 50 mg daily.  -hold metoprolol in setting of permissive hypertension -continue ASA 81 mg daily  #COPD: Mr Sitzer is sating 100% on RA in no respiratory distress since presentation. At home he uses 2.5 L O2 at nights, spiriva 18 mcg inh daily, and albuterol 2 puff inh q6hprn. -continue spiriva 18 mcg inh daily, albuterol 2 puff q6hprn  #HTN:  At home he uses ASA 81 mg daily, metoprolol 50 mg  daily. Normotensive overnight -hold metoprolol in setting of permissive hypertension -continue ASA 81 mg daily  #Thrombocytopenia: Presenting platelet 110. On previous admission in 07/2012, his platelets ranged from 94-199 and was previously attributed to his critical illness but cause is unclear as it is persistent. He does not appear to have liver disease as AST and ALT wnl. -platelets stable at 100, will continue to monitor  #Psoriasis: Mr Stonebraker has plaques on his b/l knees and R elbow. At home he uses flucinonide ointment bid -cont flucinonide ointment bid  #Prostate cancer s/p prostatectomy: Mr Warehime family said he had  a prostatectomy about ten years ago for prostate cancer. When asked if there were any issues since, they said he has a brain mass that the doctors are not concerned about. However, no brain mass was noted on head CT wo contrast. At home he is on terazosin 1 mg qhs -MRI/MRA head brain wo contrast completed, notes a rt frontal lobe remote infarct w/ encephalomalacia that does not represent a mass since there has not been any changes since 2014 -cont terazosin 1 mg qhs - PSA ordered  #Diet: carb modified  #DVT PPx: heparin 5000 u [place] tid  #Code: full  Dispo: Disposition is deferred at this time, awaiting improvement of current medical problems. Anticipated discharge in approximately 1-2 day(s).   The patient does have a current PCP in the [place] [facility] and does need an [facility] hospital follow-up appointment after discharge.  The patient does not know have transportation limitations that hinder transportation to clinic appointments. .Services Needed at time of discharge: Y = Yes, Blank = No PT:   OT:   RN:   Equipment:   Other:     LOS: 1 day   Julious Oka, MD 02/23/2014, 12:28 PM

## 2014-02-23 NOTE — Progress Notes (Signed)
Rehab Admissions Coordinator Note:  Patient was screened by [name] for appropriateness for an Inpatient Acute Rehab Consult.  At this time, we are recommending Inpatient Rehab consult.  [name] 02/23/2014, 10:00 AM  I can be reached at [phone].

## 2014-02-23 NOTE — Progress Notes (Signed)
INITIAL NUTRITION ASSESSMENT  DOCUMENTATION CODES Per approved criteria  -Non-severe (moderate) malnutrition in the context of chronic illness  Pt meets criteria for MODERATE MALNUTRITION in the context of CHRONIC ILLNESS as evidenced by moderate muscle wasting and 18% weight loss in approximately 6 months.  INTERVENTION: Provide Glucerna Shake BID, each supplement provides 220 kcal and 10 grams of protein Provide Multivitamin with minerals daily  NUTRITION DIAGNOSIS: Unintentional weight loss related to varied PO intake as evidenced by reported 36 lb (18%) weight loss.   Goal: Pt to meet >/= 90% of their estimated nutrition needs   Monitor:  PO intake, supplement acceptance, weight trend, labs  Reason for Assessment: Malnutrition Screening Tool score of 2  81 y.o. male  Admitting Dx: Weakness  ASSESSMENT: 81 year old man with h/o multiple TIAs w residual L sided weakness, CHF, COPD 2.5 L home O2, DM2, HTN, prostate cancer s/p prostatectomy who presents with weakness.   Pt states that his appetite is good and he was eating well PTA. He eats 2 meals daily most days, 3 meals some days, and usually snacks in between meals. He states that he has been losing weight but, is not sure why. He reports weighing 202 lbs back in the summer. RD encouraged pt to aim for 3 meals daily with protein-rich foods at each meal. Encouraging snacking and use of nutritional supplements to prevent further weight loss.   Labs: high sodium, elevated creatinine, low GFR, low hemoglobin  Nutrition Focused Physical Exam:  Subcutaneous Fat:  Orbital Region: WNL Upper Arm Region: mild to moderate wasting Thoracic and Lumbar Region: WNL  Muscle:  Temple Region: WNL Clavicle Bone Region: mild wasting Clavicle and Acromion Bone Region: mild wasting Scapular Bone Region: NA Dorsal Hand: mild wasting Patellar Region: moderate wasting Anterior Thigh Region: moderate wasting Posterior Calf Region: moderate  wasting  Edema: none noted  Height: Ht Readings from Last 1 Encounters:  02/22/14 5\' 9"  (1.753 m)    Weight: Wt Readings from Last 1 Encounters:  02/22/14 166 lb 10.7 oz (75.6 kg)    Ideal Body Weight: 160 lbs  % Ideal Body Weight: 104%  Wt Readings from Last 10 Encounters:  02/22/14 166 lb 10.7 oz (75.6 kg)  08/27/12 183 lb (83.008 kg)  08/15/12 182 lb 1.6 oz (82.6 kg)    Usual Body Weight: 202 lbs  % Usual Body Weight: 82%  BMI:  Body mass index is 24.6 kg/(m^2).  Estimated Nutritional Needs: Kcal: 1800-2000 Protein: 100-115 grams Fluid: 1.8-2 L/day  Skin: intact  Diet Order: Diet Carb Modified  EDUCATION NEEDS: -No education needs identified at this time   Intake/Output Summary (Last 24 hours) at 02/23/14 1106 Last data filed at 02/23/14 0834  Gross per 24 hour  Intake    360 ml  Output      0 ml  Net    360 ml    Last BM: 1/25   Labs:   Recent Labs Lab 02/22/14 1355 02/22/14 2250 02/23/14 0735  NA 146* 147* 147*  K 4.4 4.4 4.4  CL 112 114* 114*  CO2 28 27 25   BUN 34* 33* 32*  CREATININE 4.27* 4.21* 4.24*  CALCIUM 9.5 8.8 9.0  GLUCOSE 108* 142* 93    CBG (last 3)   Recent Labs  02/22/14 2001 02/22/14 2320 02/23/14 0652  GLUCAP 76 123* 82    Scheduled Meds: . aspirin EC  81 mg Oral Daily  . [START ON 02/24/2014] calcitRIOL  0.25 mcg Oral Q  M,W,F  . Chlorhexidine Gluconate Cloth  6 each Topical Q0600  . docusate sodium  100 mg Oral QHS  . doxycycline  100 mg Oral Q12H  . fluocinonide ointment  1 application Topical BID  . heparin  5,000 Units Subcutaneous 3 times per day  . mupirocin ointment  1 application Nasal BID  . polycarbophil  1,250 mg Oral Daily  . terazosin  1 mg Oral QHS  . tiotropium  18 mcg Inhalation Daily    Continuous Infusions:   Past Medical History  Diagnosis Date  . Hypertension   . CHF (congestive heart failure)   . COPD (chronic obstructive pulmonary disease)     use 2 1/2 L oxygen at nights   . Emphysema   . Diabetes mellitus without complication     non insulin dependent  . Cancer     prostate  . Renal disorder kidneys only function at 20%    pt does not want dialysis  . Stroke     TIA, multiple    Past Surgical History  Procedure Laterality Date  . Prostatectomy      Pryor Ochoa RD, LDN Inpatient Clinical Dietitian Pager: [phone] After Hours Pager: [phone]

## 2014-02-23 NOTE — Evaluation (Signed)
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 H says that he has had worsening L sided weakness of his arm and leg for the past two weeks. He had an unwitnessed fall on Friday 1/22.  According to MRI of head:  No acute infarct.  Clinical Impression  Pt admitted with above symptoms of unclear etiology. Pt currently with functional decline (was modified independent with RW 2 weeks PTA) due to the deficits listed below (see PT Problem List). Pt very motivated and familiar with post-acute rehab. Pt will benefit from skilled PT to increase their independence and safety with mobility to allow discharge to the venue listed below.       Follow Up Recommendations CIR    Equipment Recommendations  None recommended by PT    Recommendations for Other Services Rehab consult     Precautions / Restrictions Precautions Precautions: Fall Restrictions Weight Bearing Restrictions: No      Mobility  Bed Mobility Overal bed mobility: Needs Assistance Bed Mobility: Rolling;Sidelying to Sit;Sit to Sidelying Rolling: Min assist Sidelying to sit: Mod assist;HOB elevated     Sit to sidelying: Mod assist General bed mobility comments: +rail; HOB elevated to simulate his bed at home (2 pillows); once seated able to come to EOB with minguard (reports he fell off EOB at home recently); assist to legs to supine  Transfers Overall transfer level: Needs assistance Equipment used: Rolling walker (2 wheeled) Transfers: Sit to/from Stand Sit to Stand: Min assist         General transfer comment: vc for safe use of RW; assist to shift anteriorly and steady as reaching up to RW  Ambulation/Gait Ambulation/Gait assistance: Mod  assist Ambulation Distance (Feet): 18 Feet Assistive device: Rolling walker (2 wheeled) Gait Pattern/deviations: Step-to pattern;Decreased step length - right;Decreased stance time - left;Decreased dorsiflexion - left   Gait velocity interpretation: Below normal speed for age/gender General Gait Details: initially Lt knee did not require support, however with fatigue, lt knee began to partially buckle with assist to maintain balance (LUE weak and unable to support him when knee gives away)  Stairs            Wheelchair Mobility    Modified Rankin (Stroke Patients Only) Modified Rankin (Stroke Patients Only) Pre-Morbid Rankin Score: Moderate disability Modified Rankin: Moderately severe disability     Balance Overall balance assessment: Needs assistance;History of Falls Sitting-balance support: Feet supported Sitting balance-Leahy Scale: Fair     Standing balance support: Bilateral upper extremity supported Standing balance-Leahy Scale: Poor                               Pertinent Vitals/Pain Pain Assessment: No/denies pain    Home Living Family/patient expects to be discharged to:: Private residence Living Arrangements: Spouse/significant other Available Help at Discharge: Family;Available 24 hours/day Type of Home: House Home Access: Stairs to enter Entrance Stairs-Rails: None Entrance Stairs-Number of Steps: 3 Home Layout: One level Home Equipment: Walker - 2 wheels;Shower seat;Grab bars - tub/shower;Cane - single point      Prior Function Level of Independence: Needs assistance;Independent with assistive device(s)   Gait / Transfers Assistance Needed: modified independent with RW  ADL's / Homemaking Assistance Needed: min assist, patient reports his wife assists with LB ADLs        Hand Dominance   Dominant Hand: Right    Extremity/Trunk Assessment   Upper Extremity Assessment: Defer to OT evaluation           Lower Extremity  Assessment: Generalized weakness;RLE deficits/detail;LLE deficits/detail RLE Deficits / Details: AROM WFL; knee extension 4/5, DF 5/5 LLE Deficits / Details: AAROM WFL; hip flexion 3/5, knee extension 2+/5, ankle DF 3-/5  Cervical / Trunk Assessment: Kyphotic  Communication   Communication: HOH  Cognition Arousal/Alertness: Awake/alert Behavior During Therapy: WFL for tasks assessed/performed Overall Cognitive Status: Within Functional Limits for tasks assessed                      General Comments      Exercises        Assessment/Plan    PT Assessment Patient needs continued PT services  PT Diagnosis Hemiplegia non-dominant side   PT Problem List Decreased strength;Decreased activity tolerance;Decreased balance;Decreased mobility;Decreased knowledge of use of DME;Decreased safety awareness  PT Treatment Interventions DME instruction;Gait training;Stair training;Functional mobility training;Therapeutic activities;Therapeutic exercise;Balance training;Neuromuscular re-education;Patient/family education   PT Goals (Current goals can be found in the Care Plan section) Acute Rehab PT Goals Patient Stated Goal: get stronger PT Goal Formulation: With patient Time For Goal Achievement: 03/02/14 Potential to Achieve Goals: Good    Frequency Min 4X/week   Barriers to discharge        Co-evaluation               End of Session Equipment Utilized During Treatment: Gait belt Activity Tolerance: Patient limited by fatigue Patient left: in bed;with call bell/phone within reach;with bed alarm set Nurse Communication: Mobility status         Time: 9163-8466 PT Time Calculation (min) (ACUTE ONLY): 28 min   Charges:   PT Evaluation $Initial PT Evaluation Tier I: 1 Procedure PT Treatments $Gait Training: 8-22 mins   PT G Codes:        Breanna Shorkey March 18, 2014, 3:38 PM Pager [phone]

## 2014-02-23 NOTE — Progress Notes (Signed)
  Echocardiogram 2D Echocardiogram has been performed.  Brad Jones 02/23/2014, 12:28 PM

## 2014-02-23 NOTE — Progress Notes (Signed)
Physical medicine rehabilitation consult requested chart reviewed. Patient was just admitted last evening 02/22/2014 with weakness. Currently MRI is negative. Will await full therapy discipline evaluations to be completed and follow-up at that time with recommendations

## 2014-02-23 NOTE — Progress Notes (Signed)
PT Cancellation Note  Patient Details Name: Brad Jones MRN: [id number] DOB: 1927-12-12   Cancelled Treatment:    Reason Eval/Treat Not Completed: Patient unavailable-Just getting on bedpan and asked PT to return later.   Kobyn Kray 02/23/2014, 2:08 PM Pager [phone]

## 2014-02-23 NOTE — Evaluation (Signed)
Speech Language Pathology Evaluation Patient Details Name: Kiowa Hollar MRN: [id number] DOB: 04-30-1927 Today's Date: 02/23/2014 Time: 2671-2458 SLP Time Calculation (min) (ACUTE ONLY): 15 min  Problem List:  Patient Active Problem List   Diagnosis Date Noted  . Malnutrition of moderate degree 02/23/2014  . Weakness 02/22/2014  . Anemia 02/22/2014  . Hand lesion 02/22/2014  . Hypertension 02/22/2014  . Diastolic dysfunction 09/98/3382  . OSA (obstructive sleep apnea)--non compliant with CPAP 08/21/2012  . Physical deconditioning 08/19/2012  . Chronic kidney disease (CKD), stage IV (severe) 08/19/2012  . COPD (chronic obstructive pulmonary disease) 08/19/2012  . Thrombocytopenia 08/19/2012  . Macular degeneration 08/19/2012  . Acute renal failure 08/10/2012  . Septic shock(785.52) 08/10/2012   Past Medical History:  Past Medical History  Diagnosis Date  . Hypertension   . CHF (congestive heart failure)   . COPD (chronic obstructive pulmonary disease)     use 2 1/2 L oxygen at nights  . Emphysema   . Diabetes mellitus without complication     non insulin dependent  . Cancer     prostate  . Renal disorder kidneys only function at 20%    pt does not want dialysis  . Stroke     TIA, multiple   Past Surgical History:  Past Surgical History  Procedure Laterality Date  . 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 He says that he has had worsening L sided weakness of his arm and leg for the past two weeks. He had an unwitnessed fall on Friday 1/22. According to MRI of head: No acute infarct.   Assessment / Plan / Recommendation Clinical Impression  Pt presents with baseline mild cognitive deficits in memory and problem-solving, with no acute exacerbation during this hospitalization.  Lives with wife, with family support for  grocery shopping and MD visits.  No SLP f/u is warranted.     SLP Assessment  Patient does not need any further Speech Lanaguage Pathology Services             SLP Goals     SLP Evaluation Prior Functioning  Cognitive/Linguistic Baseline: Baseline deficits (07/2012 CIR -followed by SLP for memory and problem solving) Baseline deficit details: memory, per pt Type of Home: House  Lives With: Spouse Available Help at Discharge: Family;Available 24 hours/day   Cognition  Overall Cognitive Status: History of cognitive impairments - at baseline Arousal/Alertness: Awake/alert Orientation Level: Disoriented to time Attention: Sustained Sustained Attention: Appears intact Memory: Impaired Memory Impairment: Retrieval deficit Awareness: Appears intact Problem Solving: Impaired Problem Solving Impairment: Verbal complex Safety/Judgment: Appears intact    Comprehension  Auditory Comprehension Overall Auditory Comprehension: Appears within functional limits for tasks assessed Visual Recognition/Discrimination Discrimination: Within Function Limits Reading Comprehension Reading Status: Not tested    Expression Expression Primary Mode of Expression: Verbal Verbal Expression Overall Verbal Expression: Appears within functional limits for tasks assessed Written Expression Dominant Hand: Right Written Expression: Not tested   Oral / Motor Oral Motor/Sensory Function Overall Oral Motor/Sensory Function: Appears within functional limits for tasks assessed Motor Speech Overall Motor Speech: Appears within functional limits for tasks assessed   Leonna Schlee L. Tivis Ringer, [place] CCC/SLP Pager [phone]      Juan Quam Laurice 02/23/2014, 12:45 PM

## 2014-02-23 NOTE — Care Management Note (Addendum)
Page 1 of 2   03/01/2014     2:10:53 PM CARE MANAGEMENT NOTE 03/01/2014  Patient:  Brad Jones, Brad Jones   Account Number:  [id number]  Date Initiated:  02/23/2014  Documentation initiated by:  Lorne Skeens  Subjective/Objective Assessment:   Patient was admitted with left sided weakness.     Action/Plan:   Will follow for discharge needs. Patient follows with the [facility] as outpatient.   Anticipated DC Date:     Anticipated DC Plan:           Choice offered to / List presented to:             Status of service:  Completed, signed off Medicare Important Message given?  YES (If response is "NO", the following Medicare IM given date fields will be blank) Date Medicare IM given:  02/25/2014 Medicare IM given by:  Lorne Skeens Date Additional Medicare IM given:  03/01/2014 Additional Medicare IM given by:  Lorne Skeens  Discharge Disposition:  [place]  Per UR Regulation:  Reviewed for med. necessity/level of care/duration of stay  If discussed at [place] of Stay Meetings, dates discussed:    Comments:  03/01/14 [place], MSN, CM- Disc with MRI films were provided to patient's daughter [name] by the request of [place] neurosurgeon.  Daughter is aware that the disc will need to be taken to patient's follow-up appointments.  Discharge summary was faxed to Norina Buzzard with the [facility].   03/01/14 [place] RN, MSN, CM- Norina Buzzard with [facility] was notified via phone that patient is pursuing SNF.  CSW is assisting with discharge planning at this time.  CM will notify the [place] once final disposition is determined.  Additional Medicare IM letter provided.   02/26/14 [place], MSN, CM- Physician signatures on transfer request received and faxed to [facility].  Voicemail was left for Norina Buzzard to verify that paperwork was received.  Awaiting return call.   02/26/14 [place], MSN, CM- Spoke with Dr Marinda Elk,  attending MD regarding [facility] transfer request.  Dr Marinda Elk currently states that he does not feel patient is stable for transfer due to spinal cord involvement found on the cervical MRI.  He would like to discuss this further with Dr Ronnald Ramp (neurosurgery) before making a decision regarding medical stability.  CM faxed updated clinicals to [facility] and conveyed this information to [employer] via phone.   02/23/14 [place], MSN, CM- Request for transfer faxed to [facility].  Attending MD is aware of need to complete and sign the physician portion.  CM will fax the additional pages once completed.   02/23/14  [place], MSN, CM- Met with daughter [name] [phone] to discuss [place] hospital transfer.  Daughter confirmed that they are only interested in transfer to the [place] [facility] in [place].  CM awaits call from [facility] for information on how to proceed. Voicemail left for [place] with [facility] to notify of patient's wishes.   02/23/14 [place], MSN, CM- Spoke with Margarita Grizzle at the [facility], who states that patient requested transfer to [facility] currently has no beds and patient is under the [facility] jurisdiction at this time.  Per Laurie's request, CM met with patient to discuss possible transfer to [facility] pending bed availability.  Patient states that this needs to be discussed with his daughter, who should be arriving  shortly.  CM requested that staff call when daughter has arrived.  Patient is currently declining transfer to [facility], as it is too far from home. Voicemail was left for Fredderick [place] transfer coordinator for the [facility] to notify of patient admission.  CM will continue to follow.

## 2014-02-24 DIAGNOSIS — N184 Chronic kidney disease, stage 4 (severe): Secondary | ICD-10-CM

## 2014-02-24 DIAGNOSIS — K1370 Unspecified lesions of oral mucosa: Secondary | ICD-10-CM

## 2014-02-24 DIAGNOSIS — G8250 Quadriplegia, unspecified: Secondary | ICD-10-CM

## 2014-02-24 LAB — BASIC METABOLIC PANEL
Anion gap: 8 (ref 5–15)
BUN: 35 mg/dL — AB (ref 6–23)
CALCIUM: 9 mg/dL (ref 8.4–10.5)
CHLORIDE: 112 mmol/L (ref 96–112)
CO2: 25 mmol/L (ref 19–32)
Creatinine, Ser: 4.27 mg/dL — ABNORMAL HIGH (ref 0.50–1.35)
GFR calc non Af Amer: 12 mL/min — ABNORMAL LOW (ref >90)
GFR, EST AFRICAN AMERICAN: 13 mL/min — AB (ref >90)
GLUCOSE: 108 mg/dL — AB (ref 70–99)
POTASSIUM: 4.6 mmol/L (ref 3.5–5.1)
Sodium: 145 mmol/L (ref 135–145)

## 2014-02-24 LAB — CBC WITH DIFFERENTIAL/PLATELET
BASOS ABS: 0 10*3/uL (ref 0.0–0.1)
Basophils Relative: 0 % (ref 0–1)
EOS ABS: 0.3 10*3/uL (ref 0.0–0.7)
Eosinophils Relative: 4 % (ref 0–5)
HCT: 31.1 % — ABNORMAL LOW (ref 39.0–52.0)
HEMOGLOBIN: 9.3 g/dL — AB (ref 13.0–17.0)
LYMPHS ABS: 1 10*3/uL (ref 0.7–4.0)
Lymphocytes Relative: 13 % (ref 12–46)
MCH: 24.6 pg — ABNORMAL LOW (ref 26.0–34.0)
MCHC: 29.9 g/dL — ABNORMAL LOW (ref 30.0–36.0)
MCV: 82.3 fL (ref 78.0–100.0)
MONO ABS: 0.6 10*3/uL (ref 0.1–1.0)
Monocytes Relative: 9 % (ref 3–12)
NEUTROS PCT: 74 % (ref 43–77)
Neutro Abs: 5.3 10*3/uL (ref 1.7–7.7)
Platelets: 151 10*3/uL (ref 150–400)
RBC: 3.78 MIL/uL — ABNORMAL LOW (ref 4.22–5.81)
RDW: 15.3 % (ref 11.5–15.5)
WBC: 7.2 10*3/uL (ref 4.0–10.5)

## 2014-02-24 LAB — GLUCOSE, CAPILLARY
GLUCOSE-CAPILLARY: 87 mg/dL (ref 70–99)
GLUCOSE-CAPILLARY: 105 mg/dL — AB (ref 70–99)
GLUCOSE-CAPILLARY: 91 mg/dL (ref 70–99)
Glucose-Capillary: 105 mg/dL — ABNORMAL HIGH (ref 70–99)
Glucose-Capillary: 124 mg/dL — ABNORMAL HIGH (ref 70–99)

## 2014-02-24 LAB — CK TOTAL AND CKMB (NOT AT ARMC)
CK TOTAL: 297 U/L — AB (ref 7–232)
CK, MB: 2.9 ng/mL (ref 0.3–4.0)
Relative Index: 1 (ref 0.0–2.5)

## 2014-02-24 LAB — PSA: PSA: 5.54 ng/mL — ABNORMAL HIGH (ref <=4.00)

## 2014-02-24 LAB — HAPTOGLOBIN: Haptoglobin: 116 mg/dL (ref 34–200)

## 2014-02-24 MED ORDER — LORAZEPAM 1 MG PO TABS
1.0000 mg | ORAL_TABLET | ORAL | Status: DC | PRN
Start: 2014-02-24 — End: 2014-03-02
  Administered 2014-02-28: 1 mg via ORAL
  Filled 2014-02-24: qty 1

## 2014-02-24 NOTE — Consult Note (Signed)
Physical Medicine and Rehabilitation Consult Reason for Consult: CVA versus myelopathy Referring Physician: Internal medicine   HPI: Brad Jones is a 81 y.o. 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 Patient lives with his wife and used a walker prior to admission. Patient well known to rehabilitation services for admission July 2014 for debilitation multi-medical. Presented 02/22/2014 with increased weakness over the past 4 weeks with decreased sensation in the hands and reported unwitnessed fall 02/19/2014. Cranial CT scan negative. MRI of the brain 02/23/2014 shows no acute infarct. There was prominent small vessel disease with findings suggestive of remote infarct with encephalomalacia. Transverse ligament hypertrophy with mild compression of the upper cervical spine. Cervical spondylitic changes at C2-3 with spinal stenosis and mild cord flattening. MRA of the head with atherosclerotic type changes. Echocardiogram with ejection fraction of 60% no wall motion abnormalities. Neurology consulted with workup ongoing and MRI cervical spine pending rule out myelopathy. Subcutaneous heparin added for DVT prophylaxis. MRSA PCR screen positive on contact precautions. Physical therapy evaluation completed 02/23/2014 with recommendations of physical medicine rehabilitation consult.  Discussed with neurology, question myelopathy versus myopathy. Initial MRI of the C-spine was motion degraded, inadequate quality to interpret Patient denies any neck pain He feels weak on both sides of his body. States that his stroke affected the left side of the body. He now feels weakness on the right side. Review of Systems  Respiratory: Positive for  shortness of breath.   Cardiovascular: Positive for leg swelling.  Genitourinary: Positive for urgency.  Musculoskeletal: Positive for myalgias.  Neurological: Positive for weakness.  All other systems reviewed and are negative.  Past Medical History  Diagnosis Date  . Hypertension   . CHF (congestive heart failure)   . COPD (chronic obstructive pulmonary disease)     use 2 1/2 L oxygen at nights  . Emphysema   . Diabetes mellitus without complication     non insulin dependent  . Cancer     prostate  . Renal disorder kidneys only function at 20%    pt does not want dialysis  . Stroke     TIA, multiple   Past Surgical History  Procedure Laterality Date  . Prostatectomy     Family History  Problem Relation Age of Onset  . Hypertension Mother   . Hypertension Father    Social History:  reports that he quit smoking about 23 months ago. He has never used smokeless tobacco. He reports that he does not drink alcohol. His drug history is not on file. Allergies: No Known Allergies Medications Prior to Admission  Medication Sig Dispense Refill  . albuterol (PROVENTIL HFA;VENTOLIN HFA) 108 (90 BASE) MCG/ACT inhaler Inhale 2 puffs into the lungs every 6 (six) hours as needed for wheezing.    Marland Kitchen aspirin 81 MG tablet Take 81 mg by mouth daily.    . calcitRIOL (ROCALTROL) 0.25 MCG capsule Take 0.25 mcg by mouth every Monday, Wednesday, and Friday.     . docusate sodium (COLACE) 50 MG capsule Take by mouth at bedtime.    . fluocinonide ointment (LIDEX) 3.81 % Apply 1 application topically 2 (two) times daily.    Marland Kitchen HYDROcodone-acetaminophen (NORCO/VICODIN) 5-325 MG per tablet Take 1 tablet by mouth every 6 (six) hours as needed for pain.    Marland Kitchen  metoprolol succinate (TOPROL-XL) 100 MG 24 hr tablet Take 50 mg by mouth daily. Take with or immediately following a meal.    . polycarbophil (FIBERCON) 625 MG tablet Take 2 tablets (1,250 mg total) by mouth daily. Over the counter to help bulk stools.     . terazosin (HYTRIN) 1 MG capsule Take 1 mg by mouth at bedtime.    Marland Kitchen tiotropium (SPIRIVA) 18 MCG inhalation capsule Place 18 mcg into inhaler and inhale daily.    . nitroGLYCERIN (NITROSTAT) 0.4 MG SL tablet Place 0.4 mg under the tongue every 5 (five) minutes as needed for chest pain.      Home: Home Living Family/patient expects to be discharged to:: Private residence Living Arrangements: Spouse/significant other Available Help at Discharge: Family, Available 24 hours/day Type of Home: House Home Access: Stairs to enter [employer] of Steps: 3 Entrance Stairs-Rails: None Home Layout: One level [place]: Environmental consultant - 2 wheels, Shower seat, Grab bars - tub/shower, Radio producer - single point  Lives With: Spouse  Functional History: Prior Function Level of Independence: Needs assistance, Independent with assistive device(s) Gait / Transfers Assistance Needed: modified independent with RW ADL's / Homemaking Assistance Needed: min assist, patient reports his wife assists with LB ADLs Communication / Swallowing Assistance Needed: independent Functional Status:  Mobility: Bed Mobility Overal bed mobility: Needs Assistance Bed Mobility: Rolling, Sidelying to Sit, Sit to Sidelying Rolling: Min assist Sidelying to sit: Mod assist, HOB elevated Supine to sit: Mod assist Sit to sidelying: Mod assist General bed mobility comments: +rail; HOB elevated to simulate his bed at home (2 pillows); once seated able to come to EOB with minguard (reports he fell off EOB at home recently); assist to legs to supine Transfers Overall transfer level: Needs assistance Equipment used: Rolling walker (2 wheeled) Transfers: Sit to/from Stand Sit to Stand: Min assist General transfer comment: vc for safe use of RW; assist to shift anteriorly and steady as reaching up to RW Ambulation/Gait Ambulation/Gait assistance: Mod assist Ambulation Distance (Feet): 18 Feet Assistive device: Rolling walker  (2 wheeled) Gait Pattern/deviations: Step-to pattern, Decreased step length - right, Decreased stance time - left, Decreased dorsiflexion - left Gait velocity interpretation: Below normal speed for age/gender General Gait Details: initially Lt knee did not require support, however with fatigue, lt knee began to partially buckle with assist to maintain balance (LUE weak and unable to support him when knee gives away)    ADL: ADL Overall ADL's : Needs assistance/impaired Grooming: Minimal assistance, Sitting Upper Body Bathing: Sitting, Moderate assistance Lower Body Bathing: Total assistance, Sit to/from stand Upper Body Dressing : Sitting, Moderate assistance Lower Body Dressing: Total assistance, Sit to/from stand, Cueing for safety Toilet Transfer: Moderate assistance, Ambulation, RW Functional mobility during ADLs: Minimal assistance, Moderate assistance (min->mod assist) General ADL Comments: Patient with ataxic like movements during functional mobility and transfer OOB. Patient with decreased strength throughout LUE causing decreased independence with ADLs. Patient reports his wife was assisting with LB ADLs PTA.   Cognition: Cognition Overall Cognitive Status: Within Functional Limits for tasks assessed Arousal/Alertness: Awake/alert Orientation Level: Oriented X4 Attention: Sustained Sustained Attention: Appears intact Memory: Impaired Memory Impairment: Retrieval deficit Awareness: Appears intact Problem Solving: Impaired Problem Solving Impairment: Verbal complex Safety/Judgment: Appears intact Cognition Arousal/Alertness: Awake/alert Behavior During Therapy: WFL for tasks assessed/performed Overall Cognitive Status: Within Functional Limits for tasks assessed  Blood pressure 111/63, pulse 79, temperature 97.9 F (36.6 C), temperature source Axillary, resp. rate 14, height 5\' 9"  (1.753 m), weight 75.6  kg (166 lb 10.7 oz), SpO2 100 %. Physical Exam  HENT:  Head:  Normocephalic.  Eyes: EOM are normal.  Neck: Normal range of motion. Neck supple. No thyromegaly present.  Cardiovascular: Normal rate and regular rhythm.   Respiratory: Effort normal and breath sounds normal. No respiratory distress.  GI: Soft. Bowel sounds are normal. He exhibits no distension.  Neurological: He is alert.  Patient provides his name as well as his age. Follows simple commands. Decreased fine motor skills left greater than right  Skin: Skin is warm and dry.  Upper extremity strength 3 minus at the deltoids 4 minus at the bicep tricep 3 minus bilateral grip Lower extremity 3 minus at the hip flexors and knee extensors as well as ankle dorsiflexor plantar flexor Deep tendon reflexes 3+ bilateral biceps triceps and patellar Sensory intact to light touch bilateral upper and lower limbs  Results for orders placed or performed during the hospital encounter of 02/22/14 (from the past 24 hour(s))  Glucose, capillary     Status: None   Collection Time: 02/23/14  6:52 AM  Result Value Ref Range   Glucose-Capillary 82 70 - 99 mg/dL   Comment 1 Documented in Chart    Comment 2 Notify RN   Hemoglobin A1c     Status: None   Collection Time: 02/23/14  7:35 AM  Result Value Ref Range   Hgb A1c MFr Bld 5.5 <5.7 %   Mean Plasma Glucose 111 <117 mg/dL  Basic metabolic panel     Status: Abnormal   Collection Time: 02/23/14  7:35 AM  Result Value Ref Range   Sodium 147 (H) 135 - 145 mmol/L   Potassium 4.4 3.5 - 5.1 mmol/L   Chloride 114 (H) 96 - 112 mmol/L   CO2 25 19 - 32 mmol/L   Glucose, Bld 93 70 - 99 mg/dL   BUN 32 (H) 6 - 23 mg/dL   Creatinine, Ser 4.24 (H) 0.50 - 1.35 mg/dL   Calcium 9.0 8.4 - 10.5 mg/dL   GFR calc non Af Amer 12 (L) >90 mL/min   GFR calc Af Amer 14 (L) >90 mL/min   Anion gap 8 5 - 15  CBC     Status: Abnormal   Collection Time: 02/23/14  7:35 AM  Result Value Ref Range   WBC 7.5 4.0 - 10.5 K/uL   RBC 4.01 (L) 4.22 - 5.81 MIL/uL   Hemoglobin 10.1 (L)  13.0 - 17.0 g/dL   HCT 33.1 (L) 39.0 - 52.0 %   MCV 82.5 78.0 - 100.0 fL   MCH 25.2 (L) 26.0 - 34.0 pg   MCHC 30.5 30.0 - 36.0 g/dL   RDW 15.4 11.5 - 15.5 %   Platelets 100 (L) 150 - 400 K/uL  Lipid panel     Status: None   Collection Time: 02/23/14  7:35 AM  Result Value Ref Range   Cholesterol 151 0 - 200 mg/dL   Triglycerides 85 <150 mg/dL   HDL 46 >39 mg/dL   Total CHOL/HDL Ratio 3.3 RATIO   VLDL 17 0 - 40 mg/dL   LDL Cholesterol 88 0 - 99 mg/dL  TSH     Status: None   Collection Time: 02/23/14  7:35 AM  Result Value Ref Range   TSH 1.321 0.350 - 4.500 uIU/mL  Glucose, capillary     Status: None   Collection Time: 02/23/14 11:38 AM  Result Value Ref Range   Glucose-Capillary 93 70 - 99 mg/dL  Comment 1 Notify RN    Comment 2 Documented in Chart   PSA     Status: Abnormal   Collection Time: 02/23/14  1:22 PM  Result Value Ref Range   PSA 5.54 (H) <=4.00 ng/mL  Glucose, capillary     Status: Abnormal   Collection Time: 02/23/14  4:33 PM  Result Value Ref Range   Glucose-Capillary 122 (H) 70 - 99 mg/dL  Save smear     Status: None   Collection Time: 02/23/14  5:00 PM  Result Value Ref Range   Smear Review SMEAR STAINED AND AVAILABLE FOR REVIEW   Glucose, capillary     Status: Abnormal   Collection Time: 02/24/14 12:41 AM  Result Value Ref Range   Glucose-Capillary 105 (H) 70 - 99 mg/dL   Comment 1 Documented in Chart    Comment 2 Notify RN    Dg Chest 2 View  02/22/2014   CLINICAL DATA:  Short of breath.  Initial encounter.  EXAM: CHEST  2 VIEW  COMPARISON:  08/15/2012.  FINDINGS: The cardiopericardial silhouette is within normal limits for size. Tortuous thoracic aorta with arch atherosclerosis. Pulmonary parenchymal scarring is present, most prominent at the bases. There is no airspace disease. No pleural effusion is identified. Suboptimal lateral view because the patient was unable to raise arms overhead. Partially translucent monitoring buttons are projected over  the chest.  IMPRESSION: No acute cardiopulmonary disease.   Electronically Signed   By: Dereck Ligas M.D.   On: 02/22/2014 15:51   Ct Head Wo Contrast  02/22/2014   CLINICAL DATA:  Status post fall 02/20/2014 due to left-side weakness.  EXAM: CT HEAD WITHOUT CONTRAST  TECHNIQUE: Contiguous axial images were obtained from the base of the skull through the vertex without intravenous contrast.  COMPARISON:  Head CT scan 08/09/2012.  FINDINGS: Atrophy and chronic microvascular ischemic change are identified. No evidence of acute intracranial abnormality including hemorrhage, infarct, mass lesion, mass effect, midline shift or abnormal extra-axial fluid collection is identified. There is no hydrocephalus or pneumocephalus. The calvarium is intact.  IMPRESSION: No acute abnormality.  Atrophy and chronic microvascular ischemic change.   Electronically Signed   By: Inge Rise M.D.   On: 02/22/2014 14:34   Mr Brain Wo Contrast  02/23/2014   CLINICAL DATA:  81 year old diabetic hypertensive male with multiple TIAs with residual left-sided weakness. The patient states worsening left-sided weakness involving arm and leg for the past 2 weeks. Unwitnessed fall 02/19/2014.  EXAM: MRI HEAD WITHOUT CONTRAST  MRA HEAD WITHOUT CONTRAST  TECHNIQUE: Multiplanar, multiecho pulse sequences of the brain and surrounding structures were obtained without intravenous contrast. Angiographic images of the head were obtained using MRA technique without contrast.  COMPARISON:  02/22/2014 and 08/09/2012 CT.  No comparison MR.  FINDINGS: MRI HEAD FINDINGS  Exam is motion degraded.  No acute infarct.  No intracranial hemorrhage.  Prominent small vessel disease type changes. In the right frontal lobe, findings suggestive of remote infarct with encephalomalacia. The lack of change since 2014 suggests that this does not represent an underlying mass.  Global atrophy. Ventricular prominence felt to be related to atrophy rather than  hydrocephalus.  Transverse ligament hypertrophy with mild compression of the upper cervical spine. Cervical spondylotic changes C2-3 with spinal stenosis and mild cord flattening.  Exophthalmos.  Major intracranial vascular structures are patent. Ectatic basilar artery impresses upon and superiorly displaces the hypothalamus.  MRA HEAD FINDINGS  Anterior circulation without medium or large size vessel significant stenosis  or occlusion.  Moderate narrowing and irregularity middle cerebral artery branch vessels and A2 segment anterior cerebral artery bilaterally.  Ectatic vertebral arteries and basilar artery. Right vertebral artery is dominant.  Poor delineation of the posterior inferior cerebellar artery, anterior inferior cerebellar artery and superior cerebellar artery beyond proximal aspect bilaterally.  Poor delineation posterior cerebral artery distal branches bilaterally.  No aneurysm noted.  IMPRESSION: MRI HEAD  No acute infarct.  Prominent small vessel disease type changes. In the right frontal lobe, findings suggestive of remote infarct with encephalomalacia.  Global atrophy.  Transverse ligament hypertrophy with mild compression of the upper cervical spine. Cervical spondylotic changes C2-3 with spinal stenosis and mild cord flattening.  Exophthalmos.  MRA HEAD FINDINGS  Branch vessel intracranial atherosclerotic type changes as noted above.   Electronically Signed   By: Chauncey Cruel M.D.   On: 02/23/2014 06:32   Mr Jodene Nam Head/brain Wo Cm  02/23/2014   CLINICAL DATA:  81 year old diabetic hypertensive male with multiple TIAs with residual left-sided weakness. The patient states worsening left-sided weakness involving arm and leg for the past 2 weeks. Unwitnessed fall 02/19/2014.  EXAM: MRI HEAD WITHOUT CONTRAST  MRA HEAD WITHOUT CONTRAST  TECHNIQUE: Multiplanar, multiecho pulse sequences of the brain and surrounding structures were obtained without intravenous contrast. Angiographic images of the head  were obtained using MRA technique without contrast.  COMPARISON:  02/22/2014 and 08/09/2012 CT.  No comparison MR.  FINDINGS: MRI HEAD FINDINGS  Exam is motion degraded.  No acute infarct.  No intracranial hemorrhage.  Prominent small vessel disease type changes. In the right frontal lobe, findings suggestive of remote infarct with encephalomalacia. The lack of change since 2014 suggests that this does not represent an underlying mass.  Global atrophy. Ventricular prominence felt to be related to atrophy rather than hydrocephalus.  Transverse ligament hypertrophy with mild compression of the upper cervical spine. Cervical spondylotic changes C2-3 with spinal stenosis and mild cord flattening.  Exophthalmos.  Major intracranial vascular structures are patent. Ectatic basilar artery impresses upon and superiorly displaces the hypothalamus.  MRA HEAD FINDINGS  Anterior circulation without medium or large size vessel significant stenosis or occlusion.  Moderate narrowing and irregularity middle cerebral artery branch vessels and A2 segment anterior cerebral artery bilaterally.  Ectatic vertebral arteries and basilar artery. Right vertebral artery is dominant.  Poor delineation of the posterior inferior cerebellar artery, anterior inferior cerebellar artery and superior cerebellar artery beyond proximal aspect bilaterally.  Poor delineation posterior cerebral artery distal branches bilaterally.  No aneurysm noted.  IMPRESSION: MRI HEAD  No acute infarct.  Prominent small vessel disease type changes. In the right frontal lobe, findings suggestive of remote infarct with encephalomalacia.  Global atrophy.  Transverse ligament hypertrophy with mild compression of the upper cervical spine. Cervical spondylotic changes C2-3 with spinal stenosis and mild cord flattening.  Exophthalmos.  MRA HEAD FINDINGS  Branch vessel intracranial atherosclerotic type changes as noted above.   Electronically Signed   By: Chauncey Cruel M.D.    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 Does the need for close, 24 hr/day medical supervision in concert with the patient's rehab needs make it unreasonable for this patient to be served in a less intensive setting? Yes 2. Co-Morbidities requiring supervision/potential complications: Remote CVA with left hemiparesis, COPD, diabetes with peripheral neuropathy, chronic kidney disease refusing dialysis 3. Due to bladder management, bowel management, safety, skin/wound care, disease management, medication administration and  pain management, does the patient require 24 hr/day rehab nursing? Yes 4. Does the patient require coordinated care of a physician, rehab nurse, PT (1-2 hrs/day, 5 days/week) and OT (1-2 hrs/day, 5 days/week) to address physical and functional deficits in the context of the above medical diagnosis(es)? Potentially Addressing deficits in the following areas: balance, endurance, locomotion, strength, transferring, bowel/bladder control, bathing, dressing, feeding, grooming, toileting and cognition 5. Can the patient actively participate in an intensive therapy program of at least 3 hrs of therapy per day at least 5 days per week? No 6. The potential for patient to make measurable gains while on inpatient rehab is fair 7. Anticipated functional outcomes upon discharge from inpatient rehab are min assist  with PT, min assist with OT, n/a with SLP. 8. Estimated rehab length of stay to reach the above functional goals is: 10-14 days 9. Does the patient have adequate social supports and living environment to accommodate these discharge functional goals? Potentially 10. Anticipated D/C setting: home versus SNF 11. Anticipated post D/C treatments: home health if returned home 12. Overall Rehab/Functional Prognosis: fair  RECOMMENDATIONS: This patient's condition is appropriate for continued  rehabilitative care in the following setting: needs additional workup to define etiology of weakness. Patient has agreed to participate in recommended program. Potentially Note that insurance prior authorization may be required for reimbursement for recommended care.  Comment: Discussed case with both internal medicine teaching attending as well as neurology.    02/24/2014

## 2014-02-24 NOTE — Progress Notes (Addendum)
Inpatient Rehabilitation  I met with Brad Jones at the bedside to begin disussions of his post acute rehab options.  Pt's medical workup is currently underway.  Will reach out to patient's daughter Brad Jones at pt's request to assist in making appropriate plans for disposition.  We will monitor ongoing work up status .  Please call if questions.  My co-worker Brad Jones will assume Brad Jones's case tomorrow and can be reached at [phone].  [facility] Admissions Coordinator Cell [phone] Office [phone]  [phone]) Addendum:  I spoke with pt's daughter Brad Jones [phone]).  I discussed with her that pt. will need post acute rehab prior to returning home.    She states pt. Was on IP Rehab in 2014 following a bout of PNA and she and pt.would like him to receive his rehab here again.  Will f/u tomorrow on medical condition/stability .  Please call if questions.  Gerlean Ren, PT

## 2014-02-24 NOTE — Progress Notes (Signed)
Internal Medicine Attending  Date: 02/24/2014  Patient name: Brad Jones Medical record number: [id number] Date of birth: April 09, 1927 Age: 81 y.o. Gender: male  I saw and evaluated the patient, and discussed his care with resident on A.M rounds.  I reviewed the resident's note by Dr. Hulen Luster and I agree with the resident's findings and plans as documented in her note.

## 2014-02-24 NOTE — Progress Notes (Signed)
Chaplain responded to spiritual care consult for advanced directive. Chaplain gave instructions on contents of advanced directive. Pt would like to review the contents with his daughter before completion. Chaplain informed pt to page chaplain should he need assistance. Page chaplain as needed.    02/24/14 1000  Clinical Encounter Type  Visited With Patient  Visit Type Initial;Spiritual support  Referral From Nurse  Spiritual Encounters  Spiritual Needs Prayer;Emotional  Stress Factors  Patient Stress Factors Health changes  Advance Directives (For Healthcare)  Would patient like information on creating an advanced directive? Yes - Educational materials given  Brad Jones 02/24/2014 11:00 AM

## 2014-02-24 NOTE — Progress Notes (Signed)
Bilateral carotid artery duplex completed:  1-39% ICA stenosis.  Vertebral artery flow is antegrade.     

## 2014-02-24 NOTE — Progress Notes (Signed)
Internal Medicine Attending  Date: 02/24/2014  Patient name: Brad Jones Medical record number: [id number] Date of birth: Jul 27, 1927 Age: 81 y.o. Gender: male  I received a call from the MRI tech who attempted the MRI of the cervical spine last night.  She was concerned that the Ativan patient received last night was ineffective in calming the patient, and said radiology could arrange to have anesthesia manage sedation tomorrow morning, which I requested.  I discussed with neurologist Dr. Leonel Ramsay, who feels that the MRI is important given the concerns about cervical myelopathy, and that a CT scan of the cervical spine would be inadequate to assess the cervical spinal cord; he agreed with the plan for MRI in the AM, with anesthesia consult to manage sedation.  I discussed this with patient and his daughter, and they are in agreement with the plan.

## 2014-02-24 NOTE — Progress Notes (Signed)
UR complete.  Jisela Merlino RN, MSN 

## 2014-02-24 NOTE — Progress Notes (Signed)
Subjective: No change in weakness.    Objective: Current vital signs: BP 128/68 mmHg  Pulse 95  Temp(Src) 97.9 F (36.6 C) (Oral)  Resp 24  Ht 5\' 9"  (1.753 m)  Wt 75.6 kg (166 lb 10.7 oz)  BMI 24.60 kg/m2  SpO2 96% Vital signs in last 24 hours: Temp:  [97.9 F (36.6 C)-99 F (37.2 C)] 97.9 F (36.6 C) (01/27 0545) Pulse Rate:  [74-95] 95 (01/27 0545) Resp:  [14-24] 24 (01/27 0545) BP: (111-128)/(61-76) 128/68 mmHg (01/27 0545) SpO2:  [96 %-100 %] 96 % (01/27 0545)  Intake/Output from previous day: 01/26 0701 - 01/27 0700 In: 360 [P.O.:360] Out: 100 [Urine:100] Intake/Output this shift:   Nutritional status: Diet Carb Modified  Neurologic Exam: Mental Status: Alert, oriented, thought content appropriate. Speech fluent without evidence of aphasia. Able to follow 3 step commands without difficulty. Cranial Nerves: II: Discs flat bilaterally; Visual fields grossly normal, pupils equal, round, reactive to light and accommodation III,IV, VI: ptosis not present, extra-ocular motions intact bilaterally V,VII: smile symmetric, facial light touch sensation normal bilaterally VIII: hearing normal bilaterally IX,X: gag reflex present XI: bilateral shoulder shrug XII: midline tongue extension Motor: 4/5 strength throughout but does show to be slightly weaker on the left arm and leg (residual from previous stroke) Tone and bulk:normal tone throughout; no atrophy noted Sensory: Pinprick and light touch intact throughout with decreased sensation in the LE from feet to mid shin, decreased proprioception in LE.  Deep Tendon Reflexes: 2+ and symmetric throughout with no AJ Plantars: Right: downgoingLeft: up going Cerebellar: normal finger-to-nose, and normal heel-to-shin test Gait: not tested due to safety.   Lab Results: Basic Metabolic Panel:  Recent Labs Lab 02/22/14 1355 02/22/14 2250 02/23/14 0735 02/24/14 0456  NA 146* 147* 147*  145  K 4.4 4.4 4.4 4.6  CL 112 114* 114* 112  CO2 28 27 25 25   GLUCOSE 108* 142* 93 108*  BUN 34* 33* 32* 35*  CREATININE 4.27* 4.21* 4.24* 4.27*  CALCIUM 9.5 8.8 9.0 9.0    Liver Function Tests:  Recent Labs Lab 02/22/14 1355  AST 31  ALT 11  ALKPHOS 55  BILITOT 0.6  PROT 5.7*  ALBUMIN 2.9*   No results for input(s): LIPASE, AMYLASE in the last 168 hours. No results for input(s): AMMONIA in the last 168 hours.  CBC:  Recent Labs Lab 02/22/14 1355 02/23/14 0735 02/24/14 0456  WBC 9.5 7.5 7.2  NEUTROABS 7.5  --  5.3  HGB 10.8* 10.1* 9.3*  HCT 34.9* 33.1* 31.1*  MCV 80.4 82.5 82.3  PLT 110* 100* 151    Cardiac Enzymes:  Recent Labs Lab 02/22/14 1355  TROPONINI <0.03    Lipid Panel:  Recent Labs Lab 02/23/14 0735  CHOL 151  TRIG 85  HDL 46  CHOLHDL 3.3  VLDL 17  LDLCALC 88    CBG:  Recent Labs Lab 02/23/14 0652 02/23/14 1138 02/23/14 1633 02/24/14 0041 02/24/14 0703  GLUCAP 82 93 122* 105* 45    Microbiology: Results for orders placed or performed during the hospital encounter of 02/22/14  Urine culture     Status: None   Collection Time: 02/22/14  1:49 PM  Result Value Ref Range Status   Specimen Description URINE, CLEAN CATCH  Final   Special Requests NONE  Final   Colony Count   Final    30,000 COLONIES/ML Performed at [facility]    Culture   Final    Multiple bacterial morphotypes present, none  predominant. Suggest appropriate recollection if clinically indicated. Performed at [facility]    Report Status 02/23/2014 FINAL  Final  Culture, blood (routine x 2)     Status: None (Preliminary result)   Collection Time: 02/22/14  8:55 PM  Result Value Ref Range Status   Specimen Description BLOOD RIGHT HAND  Final   Special Requests BOTTLES DRAWN AEROBIC ONLY 5CC  Final   Culture   Final           BLOOD CULTURE RECEIVED NO GROWTH TO DATE CULTURE WILL BE HELD FOR 5 DAYS BEFORE ISSUING A FINAL NEGATIVE  REPORT Performed at [facility]    Report Status PENDING  Incomplete  Culture, blood (routine x 2)     Status: None (Preliminary result)   Collection Time: 02/22/14 10:50 PM  Result Value Ref Range Status   Specimen Description BLOOD ARM LEFT  Final   Special Requests BOTTLES DRAWN AEROBIC AND ANAEROBIC 5CC EA  Final   Culture   Final           BLOOD CULTURE RECEIVED NO GROWTH TO DATE CULTURE WILL BE HELD FOR 5 DAYS BEFORE ISSUING A FINAL NEGATIVE REPORT Performed at [facility]    Report Status PENDING  Incomplete  Wound culture     Status: None (Preliminary result)   Collection Time: 02/22/14 11:31 PM  Result Value Ref Range Status   Specimen Description WOUND LEFT HAND  Final   Special Requests NONE  Final   Gram Stain   Final    NO WBC SEEN NO ORGANISMS SEEN Performed at [facility]    Culture   Final    MODERATE STAPHYLOCOCCUS AUREUS Note: RIFAMPIN AND GENTAMICIN SHOULD NOT BE USED AS SINGLE DRUGS FOR TREATMENT OF STAPH INFECTIONS. Performed at [facility]    Report Status PENDING  Incomplete  MRSA PCR Screening     Status: Abnormal   Collection Time: 02/23/14 12:02 AM  Result Value Ref Range Status   MRSA by PCR POSITIVE (A) NEGATIVE Final    Comment:        The GeneXpert MRSA Assay (FDA approved for NASAL specimens only), is one component of a comprehensive MRSA colonization surveillance program. It is not intended to diagnose MRSA infection nor to guide or monitor treatment for MRSA infections. RESULT CALLED TO, READ BACK BY AND VERIFIED WITH: BASS,S RN 0304 02/23/14 MITCHELL,L     Coagulation Studies: No results for input(s): LABPROT, INR in the last 72 hours.  Imaging: Dg Chest 2 View  02/22/2014   CLINICAL DATA:  Short of breath.  Initial encounter.  EXAM: CHEST  2 VIEW  COMPARISON:  08/15/2012.  FINDINGS: The cardiopericardial silhouette is within normal limits for size. Tortuous thoracic aorta with arch  atherosclerosis. Pulmonary parenchymal scarring is present, most prominent at the bases. There is no airspace disease. No pleural effusion is identified. Suboptimal lateral view because the patient was unable to raise arms overhead. Partially translucent monitoring buttons are projected over the chest.  IMPRESSION: No acute cardiopulmonary disease.   Electronically Signed   By: Dereck Ligas M.D.   On: 02/22/2014 15:51   Ct Head Wo Contrast  02/22/2014   CLINICAL DATA:  Status post fall 02/20/2014 due to left-side weakness.  EXAM: CT HEAD WITHOUT CONTRAST  TECHNIQUE: Contiguous axial images were obtained from the base of the skull through the vertex without intravenous contrast.  COMPARISON:  Head CT scan 08/09/2012.  FINDINGS: Atrophy and chronic microvascular  ischemic change are identified. No evidence of acute intracranial abnormality including hemorrhage, infarct, mass lesion, mass effect, midline shift or abnormal extra-axial fluid collection is identified. There is no hydrocephalus or pneumocephalus. The calvarium is intact.  IMPRESSION: No acute abnormality.  Atrophy and chronic microvascular ischemic change.   Electronically Signed   By: Inge Rise M.D.   On: 02/22/2014 14:34   Mr Brain Wo Contrast  02/23/2014   CLINICAL DATA:  81 year old diabetic hypertensive male with multiple TIAs with residual left-sided weakness. The patient states worsening left-sided weakness involving arm and leg for the past 2 weeks. Unwitnessed fall 02/19/2014.  EXAM: MRI HEAD WITHOUT CONTRAST  MRA HEAD WITHOUT CONTRAST  TECHNIQUE: Multiplanar, multiecho pulse sequences of the brain and surrounding structures were obtained without intravenous contrast. Angiographic images of the head were obtained using MRA technique without contrast.  COMPARISON:  02/22/2014 and 08/09/2012 CT.  No comparison MR.  FINDINGS: MRI HEAD FINDINGS  Exam is motion degraded.  No acute infarct.  No intracranial hemorrhage.  Prominent small  vessel disease type changes. In the right frontal lobe, findings suggestive of remote infarct with encephalomalacia. The lack of change since 2014 suggests that this does not represent an underlying mass.  Global atrophy. Ventricular prominence felt to be related to atrophy rather than hydrocephalus.  Transverse ligament hypertrophy with mild compression of the upper cervical spine. Cervical spondylotic changes C2-3 with spinal stenosis and mild cord flattening.  Exophthalmos.  Major intracranial vascular structures are patent. Ectatic basilar artery impresses upon and superiorly displaces the hypothalamus.  MRA HEAD FINDINGS  Anterior circulation without medium or large size vessel significant stenosis or occlusion.  Moderate narrowing and irregularity middle cerebral artery branch vessels and A2 segment anterior cerebral artery bilaterally.  Ectatic vertebral arteries and basilar artery. Right vertebral artery is dominant.  Poor delineation of the posterior inferior cerebellar artery, anterior inferior cerebellar artery and superior cerebellar artery beyond proximal aspect bilaterally.  Poor delineation posterior cerebral artery distal branches bilaterally.  No aneurysm noted.  IMPRESSION: MRI HEAD  No acute infarct.  Prominent small vessel disease type changes. In the right frontal lobe, findings suggestive of remote infarct with encephalomalacia.  Global atrophy.  Transverse ligament hypertrophy with mild compression of the upper cervical spine. Cervical spondylotic changes C2-3 with spinal stenosis and mild cord flattening.  Exophthalmos.  MRA HEAD FINDINGS  Branch vessel intracranial atherosclerotic type changes as noted above.   Electronically Signed   By: Chauncey Cruel M.D.   On: 02/23/2014 06:32   Mr Jodene Nam Head/brain Wo Cm  02/23/2014   CLINICAL DATA:  81 year old diabetic hypertensive male with multiple TIAs with residual left-sided weakness. The patient states worsening left-sided weakness involving arm  and leg for the past 2 weeks. Unwitnessed fall 02/19/2014.  EXAM: MRI HEAD WITHOUT CONTRAST  MRA HEAD WITHOUT CONTRAST  TECHNIQUE: Multiplanar, multiecho pulse sequences of the brain and surrounding structures were obtained without intravenous contrast. Angiographic images of the head were obtained using MRA technique without contrast.  COMPARISON:  02/22/2014 and 08/09/2012 CT.  No comparison MR.  FINDINGS: MRI HEAD FINDINGS  Exam is motion degraded.  No acute infarct.  No intracranial hemorrhage.  Prominent small vessel disease type changes. In the right frontal lobe, findings suggestive of remote infarct with encephalomalacia. The lack of change since 2014 suggests that this does not represent an underlying mass.  Global atrophy. Ventricular prominence felt to be related to atrophy rather than hydrocephalus.  Transverse ligament hypertrophy with mild compression of  the upper cervical spine. Cervical spondylotic changes C2-3 with spinal stenosis and mild cord flattening.  Exophthalmos.  Major intracranial vascular structures are patent. Ectatic basilar artery impresses upon and superiorly displaces the hypothalamus.  MRA HEAD FINDINGS  Anterior circulation without medium or large size vessel significant stenosis or occlusion.  Moderate narrowing and irregularity middle cerebral artery branch vessels and A2 segment anterior cerebral artery bilaterally.  Ectatic vertebral arteries and basilar artery. Right vertebral artery is dominant.  Poor delineation of the posterior inferior cerebellar artery, anterior inferior cerebellar artery and superior cerebellar artery beyond proximal aspect bilaterally.  Poor delineation posterior cerebral artery distal branches bilaterally.  No aneurysm noted.  IMPRESSION: MRI HEAD  No acute infarct.  Prominent small vessel disease type changes. In the right frontal lobe, findings suggestive of remote infarct with encephalomalacia.  Global atrophy.  Transverse ligament hypertrophy with  mild compression of the upper cervical spine. Cervical spondylotic changes C2-3 with spinal stenosis and mild cord flattening.  Exophthalmos.  MRA HEAD FINDINGS  Branch vessel intracranial atherosclerotic type changes as noted above.   Electronically Signed   By: Chauncey Cruel M.D.   On: 02/23/2014 06:32    Medications:  Scheduled: . aspirin EC  81 mg Oral Daily  . calcitRIOL  0.25 mcg Oral Q M,W,F  . Chlorhexidine Gluconate Cloth  6 each Topical Q0600  . docusate sodium  100 mg Oral QHS  . doxycycline  100 mg Oral Q12H  . feeding supplement (GLUCERNA SHAKE)  237 mL Oral BID  . fluocinonide ointment  1 application Topical BID  . heparin  5,000 Units Subcutaneous 3 times per day  . multivitamin with minerals  1 tablet Oral Daily  . mupirocin ointment  1 application Nasal BID  . polycarbophil  1,250 mg Oral Daily  . terazosin  1 mg Oral QHS  . tiotropium  18 mcg Inhalation Daily    Assessment/Plan:  81 yo M with generalized weakness progressive over the past few weeks. His MRI does not show stroke, but C-spine pathology could present with quadriparesis, though he does not clearly have myelopathic findings. I would favor further evaluation with an MRI. Myopathies can cause this progression of weakness as well and a CK may be helpful.   1) MRI C-spine 2) If this is negative, then I would favor further evaluation as an outpatient with EMG looking for myopathic findings.  3) ck      Etta Quill PA-C [facility] [phone]  02/24/2014, 9:14 AM

## 2014-02-24 NOTE — Progress Notes (Addendum)
Subjective: Pt states he is still weak on his rt side. Has no other complaints. Unable to tolerate C spine MRI yesterday.   Objective: Vital signs in last 24 hours: Filed Vitals:   02/23/14 1651 02/24/14 0044 02/24/14 0545 02/24/14 1028  BP: 122/76 111/63 128/68 122/65  Pulse: 85 79 95 93  Temp: 98.1 F (36.7 C) 97.9 F (36.6 C) 97.9 F (36.6 C) 97.9 F (36.6 C)  TempSrc: Oral Axillary Oral Oral  Resp: 20 14 24 20   Height:      Weight:      SpO2: 98% 100% 96% 100%   Weight change:   Intake/Output Summary (Last 24 hours) at 02/24/14 1323 Last data filed at 02/23/14 2000  Gross per 24 hour  Intake      0 ml  Output    100 ml  Net   -100 ml   General: NAD, pleasant, sitting in recliner at bedside.  HEENT: tongue lesion on left side that is yellow w/ irregular borders. Lungs: CTAB, no wheezing Cardiac: RRR, no murmurs GI: soft, active bowel sounds Neuro: rt side 5/5 strength, lt side 4/5 strength, AAOx3  Lab Results: Basic Metabolic Panel:  Recent Labs Lab 02/23/14 0735 02/24/14 0456  NA 147* 145  K 4.4 4.6  CL 114* 112  CO2 25 25  GLUCOSE 93 108*  BUN 32* 35*  CREATININE 4.24* 4.27*  CALCIUM 9.0 9.0   Liver Function Tests:  Recent Labs Lab 02/22/14 1355  AST 31  ALT 11  ALKPHOS 55  BILITOT 0.6  PROT 5.7*  ALBUMIN 2.9*   CBC:  Recent Labs Lab 02/22/14 1355 02/23/14 0735 02/24/14 0456  WBC 9.5 7.5 7.2  NEUTROABS 7.5  --  5.3  HGB 10.8* 10.1* 9.3*  HCT 34.9* 33.1* 31.1*  MCV 80.4 82.5 82.3  PLT 110* 100* 151   Cardiac Enzymes:  Recent Labs Lab 02/22/14 1355 02/24/14 1030  CKTOTAL  --  297*  CKMB  --  2.9  TROPONINI <0.03  --    CBG:  Recent Labs Lab 02/23/14 0652 02/23/14 1138 02/23/14 1633 02/24/14 0041 02/24/14 0703 02/24/14 1202  GLUCAP 82 93 122* 105* 87 105*   Fasting Lipid Panel:  Recent Labs Lab 02/23/14 0735  CHOL 151  HDL 46  LDLCALC 88  TRIG 85  CHOLHDL 3.3   Thyroid Function Tests:  Recent  Labs Lab 02/23/14 0735  TSH 1.321   Anemia Panel:  Recent Labs Lab 02/22/14 2250 02/23/14 0014  VITAMINB12  --  400  FOLATE  --  8.5  FERRITIN  --  115  TIBC  --  170*  IRON  --  32*  RETICCTPCT 1.0  --    Urinalysis:  Recent Labs Lab 02/22/14 1349  COLORURINE YELLOW  LABSPEC 1.017  PHURINE 5.0  GLUCOSEU NEGATIVE  HGBUR MODERATE*  BILIRUBINUR NEGATIVE  KETONESUR NEGATIVE  PROTEINUR 30*  UROBILINOGEN 0.2  NITRITE NEGATIVE  LEUKOCYTESUR NEGATIVE    Micro Results: Recent Results (from the past 240 hour(s))  Urine culture     Status: None   Collection Time: 02/22/14  1:49 PM  Result Value Ref Range Status   Specimen Description URINE, CLEAN CATCH  Final   Special Requests NONE  Final   Colony Count   Final    30,000 COLONIES/ML Performed at [facility]    Culture   Final    Multiple bacterial morphotypes present, none predominant. Suggest appropriate recollection if clinically indicated. Performed at [facility]  Report Status 02/23/2014 FINAL  Final  Culture, blood (routine x 2)     Status: None (Preliminary result)   Collection Time: 02/22/14  8:55 PM  Result Value Ref Range Status   Specimen Description BLOOD RIGHT HAND  Final   Special Requests BOTTLES DRAWN AEROBIC ONLY 5CC  Final   Culture   Final           BLOOD CULTURE RECEIVED NO GROWTH TO DATE CULTURE WILL BE HELD FOR 5 DAYS BEFORE ISSUING A FINAL NEGATIVE REPORT Performed at [facility]    Report Status PENDING  Incomplete  Culture, blood (routine x 2)     Status: None (Preliminary result)   Collection Time: 02/22/14 10:50 PM  Result Value Ref Range Status   Specimen Description BLOOD ARM LEFT  Final   Special Requests BOTTLES DRAWN AEROBIC AND ANAEROBIC 5CC EA  Final   Culture   Final           BLOOD CULTURE RECEIVED NO GROWTH TO DATE CULTURE WILL BE HELD FOR 5 DAYS BEFORE ISSUING A FINAL NEGATIVE REPORT Performed at [facility]    Report Status  PENDING  Incomplete  Wound culture     Status: None (Preliminary result)   Collection Time: 02/22/14 11:31 PM  Result Value Ref Range Status   Specimen Description WOUND LEFT HAND  Final   Special Requests NONE  Final   Gram Stain   Final    NO WBC SEEN NO ORGANISMS SEEN Performed at [facility]    Culture   Final    MODERATE STAPHYLOCOCCUS AUREUS Note: RIFAMPIN AND GENTAMICIN SHOULD NOT BE USED AS SINGLE DRUGS FOR TREATMENT OF STAPH INFECTIONS. Performed at [facility]    Report Status PENDING  Incomplete  MRSA PCR Screening     Status: Abnormal   Collection Time: 02/23/14 12:02 AM  Result Value Ref Range Status   MRSA by PCR POSITIVE (A) NEGATIVE Final    Comment:        The GeneXpert MRSA Assay (FDA approved for NASAL specimens only), is one component of a comprehensive MRSA colonization surveillance program. It is not intended to diagnose MRSA infection nor to guide or monitor treatment for MRSA infections. RESULT CALLED TO, READ BACK BY AND VERIFIED WITH: BASS,S RN 0304 02/23/14 MITCHELL,L    Studies/Results: Dg Chest 2 View  02/22/2014   CLINICAL DATA:  Short of breath.  Initial encounter.  EXAM: CHEST  2 VIEW  COMPARISON:  08/15/2012.  FINDINGS: The cardiopericardial silhouette is within normal limits for size. Tortuous thoracic aorta with arch atherosclerosis. Pulmonary parenchymal scarring is present, most prominent at the bases. There is no airspace disease. No pleural effusion is identified. Suboptimal lateral view because the patient was unable to raise arms overhead. Partially translucent monitoring buttons are projected over the chest.  IMPRESSION: No acute cardiopulmonary disease.   Electronically Signed   By: Dereck Ligas M.D.   On: 02/22/2014 15:51   Ct Head Wo Contrast  02/22/2014   CLINICAL DATA:  Status post fall 02/20/2014 due to left-side weakness.  EXAM: CT HEAD WITHOUT CONTRAST  TECHNIQUE: Contiguous axial images were obtained from  the base of the skull through the vertex without intravenous contrast.  COMPARISON:  Head CT scan 08/09/2012.  FINDINGS: Atrophy and chronic microvascular ischemic change are identified. No evidence of acute intracranial abnormality including hemorrhage, infarct, mass lesion, mass effect, midline shift or abnormal extra-axial fluid collection is identified. There is no hydrocephalus  or pneumocephalus. The calvarium is intact.  IMPRESSION: No acute abnormality.  Atrophy and chronic microvascular ischemic change.   Electronically Signed   By: Inge Rise M.D.   On: 02/22/2014 14:34   Mr Brain Wo Contrast  02/23/2014   CLINICAL DATA:  81 year old diabetic hypertensive male with multiple TIAs with residual left-sided weakness. The patient states worsening left-sided weakness involving arm and leg for the past 2 weeks. Unwitnessed fall 02/19/2014.  EXAM: MRI HEAD WITHOUT CONTRAST  MRA HEAD WITHOUT CONTRAST  TECHNIQUE: Multiplanar, multiecho pulse sequences of the brain and surrounding structures were obtained without intravenous contrast. Angiographic images of the head were obtained using MRA technique without contrast.  COMPARISON:  02/22/2014 and 08/09/2012 CT.  No comparison MR.  FINDINGS: MRI HEAD FINDINGS  Exam is motion degraded.  No acute infarct.  No intracranial hemorrhage.  Prominent small vessel disease type changes. In the right frontal lobe, findings suggestive of remote infarct with encephalomalacia. The lack of change since 2014 suggests that this does not represent an underlying mass.  Global atrophy. Ventricular prominence felt to be related to atrophy rather than hydrocephalus.  Transverse ligament hypertrophy with mild compression of the upper cervical spine. Cervical spondylotic changes C2-3 with spinal stenosis and mild cord flattening.  Exophthalmos.  Major intracranial vascular structures are patent. Ectatic basilar artery impresses upon and superiorly displaces the hypothalamus.  MRA HEAD  FINDINGS  Anterior circulation without medium or large size vessel significant stenosis or occlusion.  Moderate narrowing and irregularity middle cerebral artery branch vessels and A2 segment anterior cerebral artery bilaterally.  Ectatic vertebral arteries and basilar artery. Right vertebral artery is dominant.  Poor delineation of the posterior inferior cerebellar artery, anterior inferior cerebellar artery and superior cerebellar artery beyond proximal aspect bilaterally.  Poor delineation posterior cerebral artery distal branches bilaterally.  No aneurysm noted.  IMPRESSION: MRI HEAD  No acute infarct.  Prominent small vessel disease type changes. In the right frontal lobe, findings suggestive of remote infarct with encephalomalacia.  Global atrophy.  Transverse ligament hypertrophy with mild compression of the upper cervical spine. Cervical spondylotic changes C2-3 with spinal stenosis and mild cord flattening.  Exophthalmos.  MRA HEAD FINDINGS  Branch vessel intracranial atherosclerotic type changes as noted above.   Electronically Signed   By: Chauncey Cruel M.D.   On: 02/23/2014 06:32   Mr Jodene Nam Head/brain Wo Cm  02/23/2014   CLINICAL DATA:  81 year old diabetic hypertensive male with multiple TIAs with residual left-sided weakness. The patient states worsening left-sided weakness involving arm and leg for the past 2 weeks. Unwitnessed fall 02/19/2014.  EXAM: MRI HEAD WITHOUT CONTRAST  MRA HEAD WITHOUT CONTRAST  TECHNIQUE: Multiplanar, multiecho pulse sequences of the brain and surrounding structures were obtained without intravenous contrast. Angiographic images of the head were obtained using MRA technique without contrast.  COMPARISON:  02/22/2014 and 08/09/2012 CT.  No comparison MR.  FINDINGS: MRI HEAD FINDINGS  Exam is motion degraded.  No acute infarct.  No intracranial hemorrhage.  Prominent small vessel disease type changes. In the right frontal lobe, findings suggestive of remote infarct with  encephalomalacia. The lack of change since 2014 suggests that this does not represent an underlying mass.  Global atrophy. Ventricular prominence felt to be related to atrophy rather than hydrocephalus.  Transverse ligament hypertrophy with mild compression of the upper cervical spine. Cervical spondylotic changes C2-3 with spinal stenosis and mild cord flattening.  Exophthalmos.  Major intracranial vascular structures are patent. Ectatic basilar artery impresses upon and  superiorly displaces the hypothalamus.  MRA HEAD FINDINGS  Anterior circulation without medium or large size vessel significant stenosis or occlusion.  Moderate narrowing and irregularity middle cerebral artery branch vessels and A2 segment anterior cerebral artery bilaterally.  Ectatic vertebral arteries and basilar artery. Right vertebral artery is dominant.  Poor delineation of the posterior inferior cerebellar artery, anterior inferior cerebellar artery and superior cerebellar artery beyond proximal aspect bilaterally.  Poor delineation posterior cerebral artery distal branches bilaterally.  No aneurysm noted.  IMPRESSION: MRI HEAD  No acute infarct.  Prominent small vessel disease type changes. In the right frontal lobe, findings suggestive of remote infarct with encephalomalacia.  Global atrophy.  Transverse ligament hypertrophy with mild compression of the upper cervical spine. Cervical spondylotic changes C2-3 with spinal stenosis and mild cord flattening.  Exophthalmos.  MRA HEAD FINDINGS  Branch vessel intracranial atherosclerotic type changes as noted above.   Electronically Signed   By: Chauncey Cruel M.D.   On: 02/23/2014 06:32   Medications: I have reviewed the patient's current medications. Scheduled Meds: . aspirin EC  81 mg Oral Daily  . calcitRIOL  0.25 mcg Oral Q M,W,F  . Chlorhexidine Gluconate Cloth  6 each Topical Q0600  . docusate sodium  100 mg Oral QHS  . doxycycline  100 mg Oral Q12H  . feeding supplement (GLUCERNA  SHAKE)  237 mL Oral BID  . fluocinonide ointment  1 application Topical BID  . heparin  5,000 Units Subcutaneous 3 times per day  . multivitamin with minerals  1 tablet Oral Daily  . mupirocin ointment  1 application Nasal BID  . polycarbophil  1,250 mg Oral Daily  . terazosin  1 mg Oral QHS  . tiotropium  18 mcg Inhalation Daily   Continuous Infusions:  PRN Meds:.albuterol, HYDROcodone-acetaminophen, LORazepam Assessment/Plan: Principal Problem:   Weakness Active Problems:   Acute renal failure   Chronic kidney disease (CKD), stage IV (severe)   COPD (chronic obstructive pulmonary disease)   Thrombocytopenia   Anemia   Hand lesion   Hypertension   Malnutrition of moderate degree   #Weakness: Mr Stencel does have a history of several TIAs, dates unknown. His head CT was negative for any acute intracranial process but TIA still possible.  -neurology consulted, appreciate their recommendations. CK elevated at 297 -MRI/MRA head brain wo contrast neg for acute infarct, remote rt frontal infarct w/ encephalomalacia, transverse ligament hypertrophy w/ mild compression of the upper cervical spine, cervical spondylotic changes in C2-3 w/ spinal stenosis and mild cord flattening. -2d echo EF 55-60%, limited study, focal wall motion abnormality cannot be excluded, recommended TEE to look for source of embolus if indicated. Will hold off -carotid dopplers WNL -hemoglobin A1c, lipid panel WNL -ASA 325 mg po daily -PT/OT/SLP -potential candidate for CIR as has worked with them in 2014 -neuro checks q2h - MRI c spine ordered, can give 1mg  ativan x 2 doses with close monitoring.   #Oral tongue lesion-- on left side of tongue that is painful when pt eats. Could be a aphthous ulcer, will recommend f/u with oral sx on d/c  #Peripheral Neuropathy: Mr Moulder describes a peripheral neuropathy with decreased sensaton in his hands and arms. He has history of DM2 but last A1c in our EMR 03/2008 was 5.9.  His family says his A1c has been well controlled at the [place] but these records are unavailable. He is not on any diabetic medications. He also worked his whole life as a Psychologist, sport and exercise and may have arthritis. -  hemoglobin a1c 5.5 on 1/26 -cont home norco 5-325 q6hprn  #L hand pustule with cellulitis: Mr Vallejo has the pustule on his L 3rd metacarpal-phalangeal joint which expressed scant serosanguinous fluid on pressure and surrounding erythema. No signs of systemic infection with 0/4 SIRS criteria.  -wound culture growing staph aureus pending sensitivities, being tx appropriately w/ doxy 100mg  BID -BCx x 2 NGTD  #Acute on CKD - Cr 4.24 this morning from 4.21 yesterday, unclear what new b/l creatinine is.  -can have pt follow up with outpatient nephrology  #DM2: Presenting blood sugar 108. Last hemoglobin A1c in EMR is 03/2008 at 5.9. He is not on any diabetic medications. His family says his A1c has been well controlled at the [place] but these records are unavailable. His distal extremity complaints may be related to diabetic neuropathy if DM2 has been poorly controlled in past. -hemoglobin A1c pending -add SSI if needed, CBGs WNL  #Anemia:  -hgb 10.1 from 10.8 on admission, will continue to monitor -LDH 185 WNL - anemia panel consistent w/ anemia of chronic disease -fecal occult blood card pending  #CHF: Likely non-contributory as he has no signs of fluid overload on exam. Last echo 07/2012 notable for LVEF 62-83%, grade 1 diastolic dysfunction, increased relative contribution of atrial contraction to ventricular filling, moderate tricuspid regurgitation. At home he takes ASA 81 mg daily, metoprolol 50 mg daily.  - can restart lopressor if needed -continue ASA 81 mg daily  #COPD: Mr Manuele is sating 100% on RA in no respiratory distress since presentation. At home he uses 2.5 L O2 at nights, spiriva 18 mcg inh daily, and albuterol 2 puff inh q6hprn. -continue spiriva 18 mcg inh daily, albuterol 2 puff  q6hprn  #HTN:  At home he uses ASA 81 mg daily, metoprolol 50 mg daily. -holding metoprolol in setting of permissive hypertension, can now restart if needed however pt has been normotensive since admission.  -continue ASA 81 mg daily  #Thrombocytopenia: Presenting platelet 110. On previous admission in 07/2012, his platelets ranged from 94-199 and was previously attributed to his critical illness but cause is unclear as it is persistent. He does not appear to have liver disease as AST and ALT wnl. -platelets stable at 151, will continue to monitor  #Psoriasis: Mr Kandler has plaques on his b/l knees and R elbow. At home he uses flucinonide ointment bid -cont flucinonide ointment bid  #Prostate cancer s/p prostatectomy: Mr Tritschler's family said he had a prostatectomy about ten years ago for prostate cancer. When asked if there were any issues since, they said he has a brain mass that the doctors are not concerned about. However, no brain mass was noted on head CT wo contrast. At home he is on terazosin 1 mg qhs -MRI/MRA head brain wo contrast completed, notes a rt frontal lobe remote infarct w/ encephalomalacia that does not represent a mass since there has not been any changes since 2014 -cont terazosin 1 mg qhs - PSA elevated at 5.54, will need to compare with previous PSA values.  #Diet: carb modified  #DVT PPx: heparin 5000 u [place] tid  #Code: full  Dispo: Disposition is deferred at this time, awaiting improvement of current medical problems. Anticipated discharge in approximately 1-2 day(s).   The patient does have a current PCP in the [place] [facility] and does need an [facility] hospital follow-up appointment after discharge.  The patient does not know have transportation limitations that hinder transportation to clinic appointments. .Services Needed at time of  discharge: Y = Yes, Blank = No PT:   OT:   RN:   Equipment:   Other:     LOS: 2 days   Julious Oka, MD 02/24/2014, 1:23 PM

## 2014-02-24 NOTE — Discharge Summary (Cosign Needed)
Name: Brad Jones MRN: [id number] DOB: July 31, 1927 81 y.o. PCP: Provider Default, MD  Date of Admission: 02/22/2014  1:12 PM Date of Discharge: 02/26/2014 Attending Physician: Axel Filler, MD  Discharge Diagnosis:   Weakness due to cervical spinal stenosis and questionable acute cord contusion at C3-C4   Left hand cellulitis   Chronic kidney disease (CKD), stage IV (severe)   COPD (chronic obstructive pulmonary disease)   Thrombocytopenia   Anemia   Hypertension   Oral tongue lesion    Discharge Medications:   Medication List    TAKE these medications        albuterol 108 (90 BASE) MCG/ACT inhaler  Commonly known as:  PROVENTIL HFA;VENTOLIN HFA  Inhale 2 puffs into the lungs every 6 (six) hours as needed for wheezing.     aspirin 81 MG tablet  Take 81 mg by mouth daily.     calcitRIOL 0.25 MCG capsule  Commonly known as:  ROCALTROL  Take 0.25 mcg by mouth every Monday, Wednesday, and Friday.     docusate sodium 50 MG capsule  Commonly known as:  COLACE  Take by mouth at bedtime.     doxycycline 100 MG tablet  Commonly known as:  VIBRA-TABS  Take 1 tablet (100 mg total) by mouth 2 (two) times daily.     fluocinonide ointment 0.05 %  Commonly known as:  LIDEX  Apply 1 application topically 2 (two) times daily.     HYDROcodone-acetaminophen 5-325 MG per tablet  Commonly known as:  NORCO/VICODIN  Take 1 tablet by mouth every 6 (six) hours as needed for pain.     metoprolol succinate 100 MG 24 hr tablet  Commonly known as:  TOPROL-XL  Take 50 mg by mouth daily. Take with or immediately following a meal.     nitroGLYCERIN 0.4 MG SL tablet  Commonly known as:  NITROSTAT  Place 0.4 mg under the tongue every 5 (five) minutes as needed for chest pain.     polycarbophil 625 MG tablet  Commonly known as:  FIBERCON  Take 2 tablets (1,250 mg total) by mouth daily. Over the counter to help bulk stools.     terazosin 1 MG capsule  Commonly known as:  HYTRIN    Take 1 mg by mouth at bedtime.     tiotropium 18 MCG inhalation capsule  Commonly known as:  SPIRIVA  Place 18 mcg into inhaler and inhale daily.        Disposition and follow-up:   [name] was discharged from [facility] in stable condition.  At the hospital follow up visit please address:  1. Please ensure patient has appropriate f/u with [place] nephrologist and oral surgeon for oral lesion on left side of tongue.   2.  Labs / imaging needed at time of follow-up: none  3.  Pending labs/ test needing follow-up: none  Follow-up Appointments:   Discharge Instructions    Diet - low sodium heart healthy    Complete by:  As directed      Increase activity slowly    Complete by:  As directed            Consultations: Treatment Team:  Eustace Moore, MD  Procedures Performed:  Dg Chest 2 View  02/22/2014   CLINICAL DATA:  Short of breath.  Initial encounter.  EXAM: CHEST  2 VIEW  COMPARISON:  08/15/2012.  FINDINGS: The cardiopericardial silhouette is within normal limits for size. Tortuous thoracic aorta with arch  atherosclerosis. Pulmonary parenchymal scarring is present, most prominent at the bases. There is no airspace disease. No pleural effusion is identified. Suboptimal lateral view because the patient was unable to raise arms overhead. Partially translucent monitoring buttons are projected over the chest.  IMPRESSION: No acute cardiopulmonary disease.   Electronically Signed   By: Dereck Ligas M.D.   On: 02/22/2014 15:51   Ct Head Wo Contrast  02/22/2014   CLINICAL DATA:  Status post fall 02/20/2014 due to left-side weakness.  EXAM: CT HEAD WITHOUT CONTRAST  TECHNIQUE: Contiguous axial images were obtained from the base of the skull through the vertex without intravenous contrast.  COMPARISON:  Head CT scan 08/09/2012.  FINDINGS: Atrophy and chronic microvascular ischemic change are identified. No evidence of acute intracranial abnormality including  hemorrhage, infarct, mass lesion, mass effect, midline shift or abnormal extra-axial fluid collection is identified. There is no hydrocephalus or pneumocephalus. The calvarium is intact.  IMPRESSION: No acute abnormality.  Atrophy and chronic microvascular ischemic change.   Electronically Signed   By: Inge Rise M.D.   On: 02/22/2014 14:34   Ct Cervical Spine Wo Contrast  02/25/2014   ADDENDUM REPORT: 02/25/2014 19:55  ADDENDUM: Study discussed by telephone with Dr. Sherley Bounds on 02/25/2014 at 19:54 .  We discussed the nature of the patient's anatomy (with multi level congenital and acquired cervical fusion), and the suspicious findings up acute injury at C4-C5.  We discussed that it is possible that the patient sustained both acute anterior ligamentous and left facet injury at C4-C5 without disrupting the middle column.   Electronically Signed   By: Lars Pinks M.D.   On: 02/25/2014 19:55   02/25/2014   CLINICAL DATA:  81 year old male with cervical spinal stenosis, progressive weakness after a fall, MRI suspicion of cervical cord contusion. Initial encounter.  EXAM: CT CERVICAL SPINE WITHOUT CONTRAST  TECHNIQUE: Multidetector CT imaging of the cervical spine was performed without intravenous contrast. Multiplanar CT image reconstructions were also generated.  COMPARISON:  Cervical spine MRI 1346 hr the same day.  FINDINGS: Visualized skull base is intact. No atlanto-occipital dissociation. Partially calcified bulky ligamentous hypertrophy about the odontoid.  Congenital incomplete segmentation of the C2-C3, and possibly also the and C3-C4, level(s). However, there is bulky circumferential endplate osteophytosis (eccentric to the left) at C3-C4 as well as left facet hypertrophy.  Subsequent moderate to severe disc, endplate, and facet degeneration at C4-C5.  Bulky endplate osteophytosis eccentric to the right then noted from the C5 to the C7 level, with interbody ankylosis at C6-C7. Severe disc space loss  with some vacuum disc phenomena at these moderate to severe left side levels. C7-T1 facet hypertrophy, as well as vacuum disc and circumferential disc osteophyte complex at that level.  Bilateral posterior element alignment is within normal limits. No acute cervical spine fracture identified. Visible upper thoracic levels appear grossly intact.  Multilevel multifactorial spinal and foraminal stenosis result, as detailed by MRI earlier today.  Retropharyngeal course of the right greater than left carotid arteries. There is also a small volume of prevertebral fluid evident at the C4-C5 level, where left side facet joint fluid was visible earlier today. No definite C4-C5 facet or vertebral fracture is identified.  Upper lobe pulmonary emphysema partially visible.  IMPRESSION: 1. Constellation of imaging findings raise the possibility of acute C4-C5 level ligamentous injury with associated left facet diastases. No associated fracture is identified. However, this could be an unstable injury. 2. Severe cervical spine degeneration with underlying ankylosis from  the C2 to the C4 level (in part due to congenital incomplete segmentation), and also the C6-C7 level. 3. Associated multilevel severe cervical spinal and foraminal stenosis as detailed on the MRI earlier today. 4. Note also right greater than left retropharyngeal course of the carotid arteries.  Electronically Signed: By: Lars Pinks M.D. On: 02/25/2014 19:31   Mr Brain Wo Contrast  02/23/2014   CLINICAL DATA:  81 year old diabetic hypertensive male with multiple TIAs with residual left-sided weakness. The patient states worsening left-sided weakness involving arm and leg for the past 2 weeks. Unwitnessed fall 02/19/2014.  EXAM: MRI HEAD WITHOUT CONTRAST  MRA HEAD WITHOUT CONTRAST  TECHNIQUE: Multiplanar, multiecho pulse sequences of the brain and surrounding structures were obtained without intravenous contrast. Angiographic images of the head were obtained using  MRA technique without contrast.  COMPARISON:  02/22/2014 and 08/09/2012 CT.  No comparison MR.  FINDINGS: MRI HEAD FINDINGS  Exam is motion degraded.  No acute infarct.  No intracranial hemorrhage.  Prominent small vessel disease type changes. In the right frontal lobe, findings suggestive of remote infarct with encephalomalacia. The lack of change since 2014 suggests that this does not represent an underlying mass.  Global atrophy. Ventricular prominence felt to be related to atrophy rather than hydrocephalus.  Transverse ligament hypertrophy with mild compression of the upper cervical spine. Cervical spondylotic changes C2-3 with spinal stenosis and mild cord flattening.  Exophthalmos.  Major intracranial vascular structures are patent. Ectatic basilar artery impresses upon and superiorly displaces the hypothalamus.  MRA HEAD FINDINGS  Anterior circulation without medium or large size vessel significant stenosis or occlusion.  Moderate narrowing and irregularity middle cerebral artery branch vessels and A2 segment anterior cerebral artery bilaterally.  Ectatic vertebral arteries and basilar artery. Right vertebral artery is dominant.  Poor delineation of the posterior inferior cerebellar artery, anterior inferior cerebellar artery and superior cerebellar artery beyond proximal aspect bilaterally.  Poor delineation posterior cerebral artery distal branches bilaterally.  No aneurysm noted.  IMPRESSION: MRI HEAD  No acute infarct.  Prominent small vessel disease type changes. In the right frontal lobe, findings suggestive of remote infarct with encephalomalacia.  Global atrophy.  Transverse ligament hypertrophy with mild compression of the upper cervical spine. Cervical spondylotic changes C2-3 with spinal stenosis and mild cord flattening.  Exophthalmos.  MRA HEAD FINDINGS  Branch vessel intracranial atherosclerotic type changes as noted above.   Electronically Signed   By: Chauncey Cruel M.D.   On: 02/23/2014 06:32     Mr Cervical Spine Wo Contrast  02/25/2014   CLINICAL DATA:  [place] out of bed on 02/22/2014. Progressive weakness.  EXAM: MRI CERVICAL SPINE WITHOUT CONTRAST  TECHNIQUE: Multiplanar, multisequence MR imaging of the cervical spine was performed. No intravenous contrast was administered.  COMPARISON:  None.  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 Severe degenerative cervical spondylosis with severe multilevel disc disease and facet disease. Moderate endplate reactive changes noted throughout the cervical spine but no definite acute cervical spine fracture. The facets are normally aligned. No facet fractures. No abnormal STIR signal intensity in the posterior elements or paraspinal muscles.  There is significant compression of the cervical spinal cord at C4-5 with cord thinning and myelomalacia. Just above this level, at C3-4, there is a focal area of increased cord signal intensity which is worrisome for a cord contusion. The remainder of the cervical cord is unremarkable. There is moderate pannus formation at C1-2 with mild mass effect on the upper cervical cord.  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 No spinal or foraminal stenosis.  C3-4: Severe degenerative disc disease with partial interbody fusion. There is a bulging annulus, osteophytic ridging and uncinate spurring with flattening of the ventral thecal sac and narrowing of the ventral CSF space. There is also a left foraminal stenosis.  C4-5: Broad-based disc protrusion, osteophytic ridging and uncinate spurring with significant mass effect on the ventral thecal sac and bilateral foraminal stenosis, left greater than right.  C5-6: Broad-based right paracentral disc protrusion, osteophytic ridging and uncinate spurring. There is mass effect on the thecal sac asymmetric right and right greater than left foraminal stenosis.  C6-7: Diffuse bulging annulus, osteophytic ridging and uncinate spurring with mild spinal and  mild to moderate bilateral foraminal stenosis.  C7-T1: Shallow disc osteophyte complexes bilaterally with bilateral foraminal stenosis.  Additional findings include thyroid goiter with a large right-sided thyroid cyst. An endotracheal tube is noted with fluid in the pharynx.  IMPRESSION: 1. Probable chronic cervical cord myelomalacia at C4-5 with fairly tight multifactorial spinal stenosis. 2. Suspect acute cord contusion at C3-4. 3. 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 4. Probable auto interbody fusion at C3-4. 5. Multilevel spinal and foraminal stenosis as discussed above at the individual levels. These results will be called to the ordering clinician or representative by the Radiologist Assistant, and communication documented in the PACS or zVision Dashboard.   Electronically Signed   By: Kalman Jewels M.D.   On: 02/25/2014 14:56   Mr Mra Head/brain Wo Cm  02/23/2014   CLINICAL DATA:  81 year old diabetic hypertensive male with multiple TIAs with residual left-sided weakness. The patient states worsening left-sided weakness involving arm and leg for the past 2 weeks. Unwitnessed fall 02/19/2014.  EXAM: MRI HEAD WITHOUT CONTRAST  MRA HEAD WITHOUT CONTRAST  TECHNIQUE: Multiplanar, multiecho pulse sequences of the brain and surrounding structures were obtained without intravenous contrast. Angiographic images of the head were obtained using MRA technique without contrast.  COMPARISON:  02/22/2014 and 08/09/2012 CT.  No comparison MR.  FINDINGS: MRI HEAD FINDINGS  Exam is motion degraded.  No acute infarct.  No intracranial hemorrhage.  Prominent small vessel disease type changes. In the right frontal lobe, findings suggestive of remote infarct with encephalomalacia. The lack of change since 2014 suggests that this does not represent an underlying mass.  Global atrophy. Ventricular prominence felt to be related to atrophy rather than hydrocephalus.  Transverse ligament hypertrophy with  mild compression of the upper cervical spine. Cervical spondylotic changes C2-3 with spinal stenosis and mild cord flattening.  Exophthalmos.  Major intracranial vascular structures are patent. Ectatic basilar artery impresses upon and superiorly displaces the hypothalamus.  MRA HEAD FINDINGS  Anterior circulation without medium or large size vessel significant stenosis or occlusion.  Moderate narrowing and irregularity middle cerebral artery branch vessels and A2 segment anterior cerebral artery bilaterally.  Ectatic vertebral arteries and basilar artery. Right vertebral artery is dominant.  Poor delineation of the posterior inferior cerebellar artery, anterior inferior cerebellar artery and superior cerebellar artery beyond proximal aspect bilaterally.  Poor delineation posterior cerebral artery distal branches bilaterally.  No aneurysm noted.  IMPRESSION: MRI HEAD  No acute infarct.  Prominent small vessel disease type changes. In the right frontal lobe, findings suggestive of remote infarct with encephalomalacia.  Global atrophy.  Transverse ligament hypertrophy with mild compression of the upper cervical spine. Cervical spondylotic changes C2-3 with spinal stenosis and mild cord flattening.  Exophthalmos.  MRA HEAD FINDINGS  Branch vessel  intracranial atherosclerotic type changes as noted above.   Electronically Signed   By: Chauncey Cruel M.D.   On: 02/23/2014 06:32    2D Echo: Study Conclusions  - Left ventricle: Systolic function was normal. The estimated ejection fraction was in the range of 55% to 60%.  Impressions:  - Extremely limited due to poor sound wave transmission; LV function appears to be normal; focal wall motion abnormality cannot be excluded; doppler suboptimal; if clinically indicated, TEE would have greater sensitivity for source of embolus.     Admission HPI: Mr Brad Jones is a 81 year old man with h/o multiple TIAs w residual L sided weakness, CHF, COPD 2.5 L home O2,  DM2, HTN, prostate cancer s/p prostatectomy who presents with weakness. H says that he has had worsening L sided weakness of his arm and leg for the past two weeks. He had an unwitnessed fall on Friday 1/22. He says he was conscious the entire time without any confusion or bowel or bladder incontinence or any head trauma. He thinks the weakness has been constant over this time period and is "just a hair" better today than two weeks ago. He normally uses a walker to ambulate but has had difficulty ambulating over this time period. He is also concerned that he had felt cold in his hands and feet for the past two months. There is also a numbness which has been present for years. He burnt the base of his left third finger about two weeks ago with a hot pack and since developed a blister that his daughter drained with clear fluid. He also says he has had a frontal headache for the past few days. He denies any chest pain, changes in baseline SOB, palpitations, abdominal pain, nausea, emesis, diarrhea, constipation.  Of note, [facility] Brad [facility] with limited encounters in our EMR. He was hospitalized in 07/2012 for acute respiratory failure 2/2 CAP with strep pneumo bacteremia requiring intubation and acute on chronic renal failure with discharge to inpatient rehab. Mr Brad Jones and his family want his care transferred to the [facility]. They contacted the [facility] before coming to [facility] and were told there were no beds. They provided the phone number [phone] to coordinate transfer.   Hospital Course by problem list:   Weakness due to cervical spinal stenosis and questionable acute cord contusion at C3-C4   Left hand cellulitis   Chronic kidney disease (CKD), stage IV (severe)   COPD (chronic obstructive pulmonary disease)   Thrombocytopenia   Anemia   Hypertension    Oral tongue lesion  Weakness-- TIA work up completed and was negative. MRI of c spine revealed probable chronic cervical cord  myelomalacia at C4-5 with fairly tight multifactorial spinal stenosis and suspected acute cord contusion at C3-4 which are likely the cause of pt's presenting weakness symptoms. Neurosurgery saw patient and recommended cervical decompression and stabilization. Dr. Ronnald Ramp discussed w/ radiologist results of CT c spine. Believe that there could be a potential fracture at C4-C5. Aspen C collar ordered for pt on transfer to [place]. CT head and MRI/MRA head negative for any acute infarct. 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 ECHO was limited and could not detect focal wall motion abnormalities. TEE was recommended for better sensitivity for source of embolus however since there was no evidence of acute infarct on head imaging TEE  was not ordered.  Carotid dopplers WNL. HbA1c and LDL WNL. Home lopressor was held on admission to allow for permissive HTN. Neurology was consulted and ordered CK levels which was elevated at 297 which was not thought to be pathological in an african [place].   Left hand cellulitis--Mr Brad Jones has a pustule on his L 3rd metacarpal-phalangeal joint which expressed scant serosanguinous fluid on pressure and surrounding erythema. No signs of systemic infection with 0/4 SIRS criteria. Wound culture obtained in ED grew staph aureus. He was started on doxy 100mg  BID on admission. Has received 5 days of abx, would continue for 5 more days.   Oral lesion-- located on left side of tongue that causes pt pain when eating. Pt states it is new and started 1 day ago. Possibly could be an aphthous ulcer. Will need to f/u with an oral surgeon.   CKD--Mr Brad Jones's presenting creatinine was 4.27 with GFR 13. Previous GFR 19 in 07/2012. He follows closely w/ a nephrologist from the [place]. Creatinine has remained stable at around 4.27 during hospital course. Will need outpatient f/u with his nephrologist.    Discharge Vitals:   BP 112/89 mmHg  Pulse 73  Temp(Src) 98.8 F (37.1 C) (Oral)  Resp 16  Ht 5\' 9"  (1.753 m)  Wt 166 lb 10.7 oz (75.6 kg)  BMI 24.60 kg/m2  SpO2 98%  Discharge Labs:  Results for orders placed or performed during the hospital encounter of 02/22/14 (from the past 24 hour(s))  Glucose, capillary     Status: None   Collection Time: 02/25/14  3:08 PM  Result Value Ref Range   Glucose-Capillary 87 70 - 99 mg/dL   Comment 1 Documented in Chart    Comment 2 Notify RN   Glucose, capillary     Status: None   Collection Time: 02/25/14  5:12 PM  Result Value Ref Range   Glucose-Capillary 80 70 - 99 mg/dL   Comment 1 Notify RN    Comment 2 Documented in Chart   Glucose, capillary     Status: Abnormal   Collection Time: 02/25/14  9:15 PM  Result Value Ref Range   Glucose-Capillary 101 (H) 70 - 99 mg/dL   Comment 1 Notify RN    Comment 2 Documented in Chart   CBC     Status: Abnormal   Collection Time: 02/26/14  5:42 AM  Result Value Ref Range   WBC 7.3 4.0 - 10.5 K/uL   RBC 3.65 (L) 4.22 - 5.81 MIL/uL   Hemoglobin 9.0 (L) 13.0 - 17.0 g/dL   HCT 29.8 (L) 39.0 - 52.0 %   MCV 81.6 78.0 - 100.0 fL   MCH 24.7 (L) 26.0 - 34.0 pg   MCHC 30.2 30.0 - 36.0 g/dL   RDW 15.3 11.5 - 15.5 %   Platelets 164 150 - 400 K/uL  Glucose, capillary     Status: None   Collection Time: 02/26/14  6:31 AM  Result Value Ref Range   Glucose-Capillary 87 70 - 99 mg/dL   Comment 1 Notify RN    Comment 2 Documented in Chart   Glucose, capillary     Status: None   Collection Time: 02/26/14 11:40 AM  Result Value Ref Range   Glucose-Capillary 85 70 - 99 mg/dL    Signed: Julious Oka, MD 02/26/2014, 1:53 PM    Services Ordered on Discharge: none Equipment Ordered on Discharge:none

## 2014-02-25 ENCOUNTER — Inpatient Hospital Stay (HOSPITAL_COMMUNITY): Payer: Medicare Other

## 2014-02-25 ENCOUNTER — Encounter (HOSPITAL_COMMUNITY): Payer: Self-pay | Admitting: Anesthesiology

## 2014-02-25 ENCOUNTER — Inpatient Hospital Stay (HOSPITAL_COMMUNITY): Payer: Medicare Other | Admitting: Anesthesiology

## 2014-02-25 ENCOUNTER — Encounter (HOSPITAL_COMMUNITY)
Admission: EM | Disposition: A | Discharge status: Skilled Nursing Facility | Payer: Medicare Other | Source: Home / Self Care | Attending: Internal Medicine

## 2014-02-25 DIAGNOSIS — M4802 Spinal stenosis, cervical region: Principal | ICD-10-CM

## 2014-02-25 HISTORY — PX: RADIOLOGY WITH ANESTHESIA: SHX6223

## 2014-02-25 LAB — WOUND CULTURE: GRAM STAIN: NONE SEEN

## 2014-02-25 LAB — GLUCOSE, CAPILLARY
GLUCOSE-CAPILLARY: 94 mg/dL (ref 70–99)
Glucose-Capillary: 87 mg/dL (ref 70–99)
Glucose-Capillary: 101 mg/dL — ABNORMAL HIGH (ref 70–99)

## 2014-02-25 SURGERY — RADIOLOGY WITH ANESTHESIA
Anesthesia: General

## 2014-02-25 MED ORDER — PROPOFOL 10 MG/ML IV BOLUS
INTRAVENOUS | Status: AC
  Filled 2014-02-25: qty 20

## 2014-02-25 MED ORDER — FENTANYL CITRATE 0.05 MG/ML IJ SOLN
INTRAMUSCULAR | Status: AC
  Filled 2014-02-25: qty 5

## 2014-02-25 MED ORDER — MIDAZOLAM HCL 2 MG/2ML IJ SOLN
INTRAMUSCULAR | Status: AC
  Filled 2014-02-25: qty 2

## 2014-02-25 MED ORDER — MEPERIDINE HCL 25 MG/ML IJ SOLN: 6.2500 mg | INTRAMUSCULAR | Status: DC | PRN

## 2014-02-25 MED ORDER — PROMETHAZINE HCL 25 MG/ML IJ SOLN: 6.2500 mg | INTRAMUSCULAR | Status: DC | PRN

## 2014-02-25 MED ORDER — SODIUM CHLORIDE 0.9 % IV SOLN
INTRAVENOUS | Status: DC
  Administered 2014-02-25: 13:00:00 via INTRAVENOUS

## 2014-02-25 MED ORDER — FENTANYL CITRATE 0.05 MG/ML IJ SOLN
25.0000 ug | INTRAMUSCULAR | Status: DC | PRN
Start: 2014-02-25 — End: 2014-03-02
  Administered 2014-02-28: 50 ug via INTRAVENOUS
  Filled 2014-02-25: qty 2

## 2014-02-25 MED ORDER — SODIUM CHLORIDE 0.9 % IV SOLN
INTRAVENOUS | Status: DC | PRN
  Administered 2014-02-25: 13:00:00 via INTRAVENOUS

## 2014-02-25 NOTE — Progress Notes (Signed)
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 H says that he has had worsening L sided weakness of his arm and leg for the past two weeks. He had an unwitnessed fall on Friday 1/22.  According to MRI of head:  No acute infarct.    PT Comments    Patient showing some signs of increased fatigue today but motivated and agreeable to work with therapy. Patient with unsafe use of RW and with some buckling of L knee with ambulation. Patient stated that he was in the bed most of day yesterday and was just tired. Continue to recommend CIR for ongoing therapy to increase endurance and independence.   Follow Up Recommendations  CIR     Equipment Recommendations  None recommended by PT    Recommendations for Other Services       Precautions / Restrictions Precautions Precautions: Fall    Mobility  Bed Mobility Overal bed mobility: Needs Assistance Bed Mobility: Supine to Sit     Supine to sit: Mod assist     General bed mobility comments: Mod A with use of chuck pad to get EOB. A for trunk support up into sitting. Patient also used rail  Transfers Overall transfer level: Needs assistance Equipment used: Rolling walker (2 wheeled)   Sit to Stand: Mod assist         General transfer comment: Mod A to power up from lower surface. Cues for hand placement and safe use of RW  Ambulation/Gait   Ambulation Distance (Feet): 12 Feet (x2) Assistive device: Rolling walker (2 wheeled) Gait Pattern/deviations: Step-to pattern;Decreased stance time - left;Decreased step length - right;Decreased dorsiflexion - left Gait velocity: decreased   General Gait Details: patient still having some buckling of LLE. Patient  appears more fatigue and required seated rest break after short bout of ambulation. Cues for safe positioning and use of RW. Tends to keep far away from his body   Stairs            Wheelchair Mobility    Modified Rankin (Stroke Patients Only) Modified Rankin (Stroke Patients Only) Pre-Morbid Rankin Score: Moderate disability Modified Rankin: Moderately severe disability     Balance                                    Cognition Arousal/Alertness: Awake/alert Behavior During Therapy: WFL for tasks assessed/performed Overall Cognitive Status: Within Functional Limits for tasks assessed                      Exercises      General Comments        Pertinent Vitals/Pain Pain Assessment: No/denies pain    Home Living                      Prior Function            PT Goals (current goals can now be found in the care plan section) Progress towards PT goals: Progressing toward goals    Frequency  Min 4X/week    PT Plan Current plan remains appropriate    Co-evaluation  End of Session Equipment Utilized During Treatment: Gait belt Activity Tolerance: Patient limited by fatigue Patient left: in chair;with call bell/phone within reach     Time: 1017-1041 PT Time Calculation (min) (ACUTE ONLY): 24 min  Charges:  $Gait Training: 8-22 mins $Therapeutic Activity: 8-22 mins                    G Codes:      Jacqualyn Posey 02/25/2014, 10:46 AM 02/25/2014 Jacqualyn Posey PTA [phone] pager [phone] office

## 2014-02-25 NOTE — Progress Notes (Signed)
Internal Medicine Attending  Date: 02/25/2014  Patient name: Brad Jones Medical record number: [id number] Date of birth: [birth year] Age: 81 y.o. Gender: male  I saw and evaluated the patient. I discussed patient and reviewed the resident's note by Dr. Raelene Bott, and I agree with the resident's findings and plans as documented in his note.

## 2014-02-25 NOTE — Transfer of Care (Signed)
Immediate Anesthesia Transfer of Care Note  Patient: Brad Jones  Procedure(s) Performed: Procedure(s): RADIOLOGY WITH ANESTHESIA (N/A)  Patient Location: PACU  Anesthesia Type:General  Level of Consciousness: awake and patient cooperative  Airway & Oxygen Therapy: Patient Spontanous Breathing and Patient connected to face mask oxygen  Post-op Assessment: Report given to RN, Post -op Vital signs reviewed and stable and moving left arm, right arm, and right leg, lifting head, swallowing, opening eyes.  Post vital signs: Reviewed and stable  Last Vitals:  Filed Vitals:   02/25/14 1509  BP:   Pulse:   Temp: 37.1 C  Resp:     Complications: No apparent anesthesia complications

## 2014-02-25 NOTE — Consult Note (Signed)
Reason for Consult: Cervical stenosis Referring Physician: Hospitalist  Brad Jones is an 81 y.o. male.   HPI:  81 year old African-American male seen in neurosurgical consultation regarding cervical spinal stenosis. The patient states that he has had slowly progressive numbness in his hands with coldness in his hands and feet. He has a history of previous right-sided stroke that left him mildly left hemiparetic. He slid out of bed 3 days ago when "couldn't get up." No loss of consciousness. He was admitted to the hospital for workup of generalized weakness. He has been evaluated by neurology. MRI of the brain did not show cause of his apparent worsened but mild weakness in all 4 extremities. He states that each day he feels somewhat better. He denies significant pain other than the left hip. He states he "walked 2 or 3 steps in the hospital." MRI of the cervical spine showed significant spinal stenosis at C4-5 with signal change in the spinal cord and neurosurgical evaluation was requested. The patient has a history of chronic renal insufficiency with a creatinine greater than 4. He was scheduled to see his nephrologist next week. To this point he has refused dialysis.  Past Medical History  Diagnosis Date  . Hypertension   . CHF (congestive heart failure)   . COPD (chronic obstructive pulmonary disease)     use 2 1/2 L oxygen at nights  . Emphysema   . Diabetes mellitus without complication     non insulin dependent  . Cancer     prostate  . Renal disorder kidneys only function at 20%    pt does not want dialysis  . Stroke     TIA, multiple    Past Surgical History  Procedure Laterality Date  . Prostatectomy      No Known Allergies  History  Substance Use Topics  . Smoking status: Former Smoker    Quit date: 03/20/2012  . Smokeless tobacco: Never Used  . Alcohol Use: No    Family History  Problem Relation Age of Onset  . Hypertension Mother   . Hypertension Father       Review of Systems  Positive ROS: As above  All other systems have been reviewed and were otherwise negative with the exception of those mentioned in the HPI and as above.  Objective: Vital signs in last 24 hours: Temp:  [98.3 F (36.8 C)-99.7 F (37.6 C)] 99.2 F (37.3 C) (01/28 1601) Pulse Rate:  [84-101] 84 (01/28 1600) Resp:  [18-24] 19 (01/28 1600) BP: (110-142)/(64-95) 137/76 mmHg (01/28 1553) SpO2:  [93 %-100 %] 97 % (01/28 1600)  General Appearance: Alert, cooperative, no distress, appears stated age Head: Normocephalic, without obvious abnormality, atraumatic Eyes: PERRL, conjunctiva/corneas clear, EOM's intact  Neck: Supple, symmetrical, trachea midline Lungs: respirations unlabored Skin: Significant thickened skin changes over the knees  NEUROLOGIC:   Mental status: A&O x4, no aphasia, good attention span, Memory and fund of knowledge seem fair Motor Exam - grips are 3 out of 5 on the left and 4 minus out of 5 on the right, the remainder of his upper extremity exam suggest 4 out of 5 strength, lower extremities are 3-4 out of 5 on the left and about 4 out of 5 on the right in all muscle groups Sensory Exam - decreased sensation in the hands Reflexes: Decreased, no pathologic reflexes, No Hoffman's, No clonus Coordination - grossly normal in upper extremities to in bed exam Gait - not tested Balance - not tested Cranial Nerves: I:  smell Not tested  II: visual acuity  OS: na   OD: na  II: visual fields Full to confrontation  II: pupils Equal, round, reactive to light  III,VII: ptosis None  III,IV,VI: extraocular muscles  Full ROM  V: mastication Normal  V: facial light touch sensation  Normal  V,VII: corneal reflex  Present  VII: facial muscle function - upper  Normal  VII: facial muscle function - lower Normal  VIII: hearing Not tested  IX: soft palate elevation  Normal  IX,X: gag reflex Present  XI: trapezius strength  5/5  XI: sternocleidomastoid strength  5/5  XI: neck flexion strength  5/5  XII: tongue strength  Normal    Data Review Lab Results  Component Value Date   WBC 7.2 02/24/2014   HGB 9.3* 02/24/2014   HCT 31.1* 02/24/2014   MCV 82.3 02/24/2014   PLT 151 02/24/2014   Lab Results  Component Value Date   NA 145 02/24/2014   K 4.6 02/24/2014   CL 112 02/24/2014   CO2 25 02/24/2014   BUN 35* 02/24/2014   CREATININE 4.27* 02/24/2014   GLUCOSE 108* 02/24/2014   No results found for: INR, PROTIME  Radiology: Dg Chest 2 View  02/22/2014   CLINICAL DATA:  Short of breath.  Initial encounter.  EXAM: CHEST  2 VIEW  COMPARISON:  08/15/2012.  FINDINGS: The cardiopericardial silhouette is within normal limits for size. Tortuous thoracic aorta with arch atherosclerosis. Pulmonary parenchymal scarring is present, most prominent at the bases. There is no airspace disease. No pleural effusion is identified. Suboptimal lateral view because the patient was unable to raise arms overhead. Partially translucent monitoring buttons are projected over the chest.  IMPRESSION: No acute cardiopulmonary disease.   Electronically Signed   By: Dereck Ligas M.D.   On: 02/22/2014 15:51   Ct Head Wo Contrast  02/22/2014   CLINICAL DATA:  Status post fall 02/20/2014 due to left-side weakness.  EXAM: CT HEAD WITHOUT CONTRAST  TECHNIQUE: Contiguous axial images were obtained from the base of the skull through the vertex without intravenous contrast.  COMPARISON:  Head CT scan 08/09/2012.  FINDINGS: Atrophy and chronic microvascular ischemic change are identified. No evidence of acute intracranial abnormality including hemorrhage, infarct, mass lesion, mass effect, midline shift or abnormal extra-axial fluid collection is identified. There is no hydrocephalus or pneumocephalus. The calvarium is intact.  IMPRESSION: No acute abnormality.  Atrophy and chronic microvascular ischemic change.   Electronically Signed   By: Inge Rise M.D.   On: 02/22/2014 14:34    Mr Brain Wo Contrast  02/23/2014   CLINICAL DATA:  81 year old diabetic hypertensive male with multiple TIAs with residual left-sided weakness. The patient states worsening left-sided weakness involving arm and leg for the past 2 weeks. Unwitnessed fall 02/19/2014.  EXAM: MRI HEAD WITHOUT CONTRAST  MRA HEAD WITHOUT CONTRAST  TECHNIQUE: Multiplanar, multiecho pulse sequences of the brain and surrounding structures were obtained without intravenous contrast. Angiographic images of the head were obtained using MRA technique without contrast.  COMPARISON:  02/22/2014 and 08/09/2012 CT.  No comparison MR.  FINDINGS: MRI HEAD FINDINGS  Exam is motion degraded.  No acute infarct.  No intracranial hemorrhage.  Prominent small vessel disease type changes. In the right frontal lobe, findings suggestive of remote infarct with encephalomalacia. The lack of change since 2014 suggests that this does not represent an underlying mass.  Global atrophy. Ventricular prominence felt to be related to atrophy rather than hydrocephalus.  Transverse ligament hypertrophy  with mild compression of the upper cervical spine. Cervical spondylotic changes C2-3 with spinal stenosis and mild cord flattening.  Exophthalmos.  Major intracranial vascular structures are patent. Ectatic basilar artery impresses upon and superiorly displaces the hypothalamus.  MRA HEAD FINDINGS  Anterior circulation without medium or large size vessel significant stenosis or occlusion.  Moderate narrowing and irregularity middle cerebral artery branch vessels and A2 segment anterior cerebral artery bilaterally.  Ectatic vertebral arteries and basilar artery. Right vertebral artery is dominant.  Poor delineation of the posterior inferior cerebellar artery, anterior inferior cerebellar artery and superior cerebellar artery beyond proximal aspect bilaterally.  Poor delineation posterior cerebral artery distal branches bilaterally.  No aneurysm noted.  IMPRESSION: MRI  HEAD  No acute infarct.  Prominent small vessel disease type changes. In the right frontal lobe, findings suggestive of remote infarct with encephalomalacia.  Global atrophy.  Transverse ligament hypertrophy with mild compression of the upper cervical spine. Cervical spondylotic changes C2-3 with spinal stenosis and mild cord flattening.  Exophthalmos.  MRA HEAD FINDINGS  Branch vessel intracranial atherosclerotic type changes as noted above.   Electronically Signed   By: Chauncey Cruel M.D.   On: 02/23/2014 06:32   Mr Jodene Nam Head/brain Wo Cm  02/23/2014   CLINICAL DATA:  81 year old diabetic hypertensive male with multiple TIAs with residual left-sided weakness. The patient states worsening left-sided weakness involving arm and leg for the past 2 weeks. Unwitnessed fall 02/19/2014.  EXAM: MRI HEAD WITHOUT CONTRAST  MRA HEAD WITHOUT CONTRAST  TECHNIQUE: Multiplanar, multiecho pulse sequences of the brain and surrounding structures were obtained without intravenous contrast. Angiographic images of the head were obtained using MRA technique without contrast.  COMPARISON:  02/22/2014 and 08/09/2012 CT.  No comparison MR.  FINDINGS: MRI HEAD FINDINGS  Exam is motion degraded.  No acute infarct.  No intracranial hemorrhage.  Prominent small vessel disease type changes. In the right frontal lobe, findings suggestive of remote infarct with encephalomalacia. The lack of change since 2014 suggests that this does not represent an underlying mass.  Global atrophy. Ventricular prominence felt to be related to atrophy rather than hydrocephalus.  Transverse ligament hypertrophy with mild compression of the upper cervical spine. Cervical spondylotic changes C2-3 with spinal stenosis and mild cord flattening.  Exophthalmos.  Major intracranial vascular structures are patent. Ectatic basilar artery impresses upon and superiorly displaces the hypothalamus.  MRA HEAD FINDINGS  Anterior circulation without medium or large size vessel  significant stenosis or occlusion.  Moderate narrowing and irregularity middle cerebral artery branch vessels and A2 segment anterior cerebral artery bilaterally.  Ectatic vertebral arteries and basilar artery. Right vertebral artery is dominant.  Poor delineation of the posterior inferior cerebellar artery, anterior inferior cerebellar artery and superior cerebellar artery beyond proximal aspect bilaterally.  Poor delineation posterior cerebral artery distal branches bilaterally.  No aneurysm noted.  IMPRESSION: MRI HEAD  No acute infarct.  Prominent small vessel disease type changes. In the right frontal lobe, findings suggestive of remote infarct with encephalomalacia.  Global atrophy.  Transverse ligament hypertrophy with mild compression of the upper cervical spine. Cervical spondylotic changes C2-3 with spinal stenosis and mild cord flattening.  Exophthalmos.  MRA HEAD FINDINGS  Branch vessel intracranial atherosclerotic type changes as noted above.   Electronically Signed   By: Chauncey Cruel M.D.   On: 02/23/2014 06:32     Assessment/Plan: 81 year old male with multiple medical problems including significant chronic renal insufficiency and diabetes who is admitted with progressively worsening generalized weakness. His exam  is not consistent with myelopathy though hyperreflexia could be masked by his diabetes. I did not test his gait. He has chronic weakness on the left from previous CVA. His MRI does show significant spondylosis throughout the cervical spine with significant spinal stenosis at C4-5 with signal change in the cord from C3-4 to C4-5. It is difficult to know if this is an acute injury from his fall or if this is chronic progressive stenosis with myelomalacia. His history is vague.   I could certainly address his cervical spinal stenosis with a posterior cervical laminectomy plus or minus fusion. I will get a CT scan to further evaluate the bony anatomy. He is unsure whether he would want to  move forward with surgical intervention at this time. He wants to discuss this with his family. Obviously that is reasonable. He understands that the goal of the surgery is to prevent further progressive weakness and not necessarily to improve his current condition. I cannot correct the damage already done to the cord. If this is an acute injury after the fall 3 days ago causing a mild central cord syndrome, then I would prefer to wait a few days (or even weeks) prior to surgery. If this is chronic progressive stenosis with myelomalacia then timing becomes less of an issue.   We briefly discussed the typical outcomes and recovery times of this type of surgery and this type of injury, and discussed some of the inherent risks including infection, spinal cord injury, paralysis, and anesthesia risks. Obviously, these will be discussed further should he decide he was to move forward with surgical intervention. At this point I'm not even sure he is a good surgical candidate from a medical standpoint. This may be better determined by his medical physician and by an anesthesiologist.   Brad Jones 02/25/2014 4:43 PM

## 2014-02-25 NOTE — Progress Notes (Signed)
Subjective: Patient actually feels slightly better today.   Exam: Filed Vitals:   02/25/14 1044  BP: 117/65  Pulse: 89  Temp: 98.3 F (36.8 C)  Resp: 20   Gen: In bed, NAD MS: Awake, alert, gives month as Feb, but correct year.  ZC:HYIFO EOMI Motor: 4+/5 on right, 4/5 on left.  Sensory:decreased sensation distally bilaterally.  DTR:2+ and symmetric in UE and LE   Impression: 81 yo M with progressive generalized weakness. He does not have clearly myeolopathic findings, but with decreased sensation distally, I'm wondering if he could be hiding hyperreflexia. Certainly I do not think that GBS would be at all likely given this preservation. Myopathy could be possible, though no clear medication causes. If his MRI cervical spine does not show clear compression, then I feel that an outpatient EMG would be the next step. His mildly elevated CK I would not take as pathological in an african [place].   Recommendations: 1) MRI cervical spine 2) If negative, would follow up as outpatient for EMG.   Roland Rack, MD [facility] [phone]  If 7pm- 7am, please page neurology on call as listed in [place].

## 2014-02-25 NOTE — Anesthesia Preprocedure Evaluation (Addendum)
Anesthesia Evaluation  Patient identified by MRN, date of birth, ID band Patient awake    Reviewed: Allergy & Precautions, NPO status , Patient's Chart, lab work & pertinent test results, reviewed documented beta blocker date and time   Airway Mallampati: II       Dental  (+) Dental Advisory Given, Missing   Pulmonary sleep apnea and Oxygen sleep apnea , COPD COPD inhaler, former smoker (quit 2014),  breath sounds clear to auscultation        Cardiovascular hypertension, Pt. on medications Rhythm:Regular  ECHO 02/23/2014 EF 55%   Neuro/Psych L sided weakness from CVA in past, now has generalized weakness differential between C-Spine and myopathy.  CPK is elevated CVA, Residual Symptoms    GI/Hepatic negative GI ROS, Neg liver ROS,   Endo/Other  diabetes, Type 2  Renal/GU Renal InsufficiencyRenal diseaseCreat 4.1, GFR 12     Musculoskeletal   Abdominal (+) + obese,   Peds  Hematology  (+) anemia , H/H 9/31   Anesthesia Other Findings Only has four teeth, two upper and two lower  Reproductive/Obstetrics                            Anesthesia Physical Anesthesia Plan  ASA: III  Anesthesia Plan: General   Post-op Pain Management:    Induction: Intravenous  Airway Management Planned: Oral ETT  Additional Equipment:   Intra-op Plan:   Post-operative Plan: Extubation in OR  Informed Consent: I have reviewed the patients History and Physical, chart, labs and discussed the procedure including the risks, benefits and alternatives for the proposed anesthesia with the patient or authorized representative who has indicated his/her understanding and acceptance.     Plan Discussed with:   Anesthesia Plan Comments: (Would favor controlled airway as he has HX of OSA and also some pain issues, sedation yesterday failed)        Anesthesia Quick Evaluation

## 2014-02-25 NOTE — Progress Notes (Signed)
OT Cancellation Note  Patient Details Name: Brad Jones MRN: [id number] DOB: December 17, 1927   Cancelled Treatment:    Reason Eval/Treat Not Completed: Patient at procedure or test/ unavailable. Will re-attempt OT treat as able.   [name] 02/25/2014, 1:23 PM

## 2014-02-25 NOTE — Anesthesia Postprocedure Evaluation (Signed)
  Anesthesia Post-op Note  Patient: Brad Jones  Procedure(s) Performed: Procedure(s): RADIOLOGY WITH ANESTHESIA (N/A)  Patient Location: PACU  Anesthesia Type:General  Level of Consciousness: awake and patient cooperative  Airway and Oxygen Therapy: Patient Spontanous Breathing and Patient connected to face mask oxygen  Post-op Pain: none  Post-op Assessment: Post-op Vital signs reviewed, Patient's Cardiovascular Status Stable, Respiratory Function Stable, Patent Airway and No signs of Nausea or vomiting  Post-op Vital Signs: Reviewed and stable  Last Vitals:  Filed Vitals:   02/25/14 1515  BP:   Pulse: 94  Temp:   Resp: 24    Complications: No apparent anesthesia complications

## 2014-02-25 NOTE — Progress Notes (Signed)
Subjective:  Patient states that his strength is stable since his initial admission. Patient is not having any complaints. Patient was informed that he is due for another MRI with anesthesia this time to take a better look at his cervical spine. Patient voiced understanding and is amenable to this plan.  Objective: Vital signs in last 24 hours: Filed Vitals:   02/25/14 1545 02/25/14 1553 02/25/14 1600 02/25/14 1601  BP:  137/76    Pulse: 95 86 84   Temp:    99.2 F (37.3 C)  TempSrc:      Resp: 18 21 19    Height:      Weight:      SpO2: 100% 100% 97%    Weight change:   Intake/Output Summary (Last 24 hours) at 02/25/14 1633 Last data filed at 02/25/14 1600  Gross per 24 hour  Intake    450 ml  Output    525 ml  Net    -75 ml   General: NAD, pleasant, lying in bed Lungs: CTAB, no wheezing Cardiac: RRR, no murmurs GI: soft, active bowel sounds Neuro: rt side 5/5 strength, lt side 4/5 strength, AAOx3 Ext: No pitting edema, anterior bilateral lower extremity hyperpigmented plaques with overlying scale  Lab Results: Basic Metabolic Panel:  Recent Labs Lab 02/23/14 0735 02/24/14 0456  NA 147* 145  K 4.4 4.6  CL 114* 112  CO2 25 25  GLUCOSE 93 108*  BUN 32* 35*  CREATININE 4.24* 4.27*  CALCIUM 9.0 9.0   Liver Function Tests:  Recent Labs Lab 02/22/14 1355  AST 31  ALT 11  ALKPHOS 55  BILITOT 0.6  PROT 5.7*  ALBUMIN 2.9*   CBC:  Recent Labs Lab 02/22/14 1355 02/23/14 0735 02/24/14 0456  WBC 9.5 7.5 7.2  NEUTROABS 7.5  --  5.3  HGB 10.8* 10.1* 9.3*  HCT 34.9* 33.1* 31.1*  MCV 80.4 82.5 82.3  PLT 110* 100* 151   Cardiac Enzymes:  Recent Labs Lab 02/22/14 1355 02/24/14 1030  CKTOTAL  --  297*  CKMB  --  2.9  TROPONINI <0.03  --    CBG:  Recent Labs Lab 02/24/14 0703 02/24/14 1202 02/24/14 1720 02/24/14 2157 02/25/14 1158 02/25/14 1508  GLUCAP 87 105* 91 124* 94 87   Fasting Lipid Panel:  Recent Labs Lab 02/23/14 0735    CHOL 151  HDL 46  LDLCALC 88  TRIG 85  CHOLHDL 3.3   Thyroid Function Tests:  Recent Labs Lab 02/23/14 0735  TSH 1.321   Anemia Panel:  Recent Labs Lab 02/22/14 2250 02/23/14 0014  VITAMINB12  --  400  FOLATE  --  8.5  FERRITIN  --  115  TIBC  --  170*  IRON  --  32*  RETICCTPCT 1.0  --    Urinalysis:  Recent Labs Lab 02/22/14 1349  COLORURINE YELLOW  LABSPEC 1.017  PHURINE 5.0  GLUCOSEU NEGATIVE  HGBUR MODERATE*  BILIRUBINUR NEGATIVE  KETONESUR NEGATIVE  PROTEINUR 30*  UROBILINOGEN 0.2  NITRITE NEGATIVE  LEUKOCYTESUR NEGATIVE    Micro Results: Recent Results (from the past 240 hour(s))  Urine culture     Status: None   Collection Time: 02/22/14  1:49 PM  Result Value Ref Range Status   Specimen Description URINE, CLEAN CATCH  Final   Special Requests NONE  Final   Colony Count   Final    30,000 COLONIES/ML Performed at [facility]   Final  Multiple bacterial morphotypes present, none predominant. Suggest appropriate recollection if clinically indicated. Performed at [facility]    Report Status 02/23/2014 FINAL  Final  Culture, blood (routine x 2)     Status: None (Preliminary result)   Collection Time: 02/22/14  8:55 PM  Result Value Ref Range Status   Specimen Description BLOOD RIGHT HAND  Final   Special Requests BOTTLES DRAWN AEROBIC ONLY 5CC  Final   Culture   Final           BLOOD CULTURE RECEIVED NO GROWTH TO DATE CULTURE WILL BE HELD FOR 5 DAYS BEFORE ISSUING A FINAL NEGATIVE REPORT Performed at [facility]    Report Status PENDING  Incomplete  Culture, blood (routine x 2)     Status: None (Preliminary result)   Collection Time: 02/22/14 10:50 PM  Result Value Ref Range Status   Specimen Description BLOOD ARM LEFT  Final   Special Requests BOTTLES DRAWN AEROBIC AND ANAEROBIC 5CC EA  Final   Culture   Final           BLOOD CULTURE RECEIVED NO GROWTH TO DATE CULTURE WILL BE HELD FOR 5 DAYS  BEFORE ISSUING A FINAL NEGATIVE REPORT Performed at [facility]    Report Status PENDING  Incomplete  Wound culture     Status: None   Collection Time: 02/22/14 11:31 PM  Result Value Ref Range Status   Specimen Description WOUND LEFT HAND  Final   Special Requests NONE  Final   Gram Stain   Final    NO WBC SEEN NO ORGANISMS SEEN Performed at [facility]    Culture   Final    MODERATE METHICILLIN RESISTANT STAPHYLOCOCCUS AUREUS Note: RIFAMPIN AND GENTAMICIN SHOULD NOT BE USED AS SINGLE DRUGS FOR TREATMENT OF STAPH INFECTIONS. This organism DOES NOT demonstrate inducible Clindamycin resistance in vitro. CRITICAL RESULT CALLED TO, READ BACK BY AND VERIFIED WITH: CNSIAN P @ 1058  ON 02/25/14 BY COOKA Performed at [facility]    Report Status 02/25/2014 FINAL  Final   Organism ID, Bacteria METHICILLIN RESISTANT STAPHYLOCOCCUS AUREUS  Final      Susceptibility   Methicillin resistant staphylococcus aureus - MIC*    CLINDAMYCIN <=0.25 SENSITIVE Sensitive     ERYTHROMYCIN >=8 RESISTANT Resistant     GENTAMICIN <=0.5 SENSITIVE Sensitive     LEVOFLOXACIN 0.25 SENSITIVE Sensitive     OXACILLIN >=4 RESISTANT Resistant     PENICILLIN >=0.5 RESISTANT Resistant     RIFAMPIN <=0.5 SENSITIVE Sensitive     TRIMETH/SULFA <=10 SENSITIVE Sensitive     VANCOMYCIN 1 SENSITIVE Sensitive     TETRACYCLINE <=1 SENSITIVE Sensitive     * MODERATE METHICILLIN RESISTANT STAPHYLOCOCCUS AUREUS  MRSA PCR Screening     Status: Abnormal   Collection Time: 02/23/14 12:02 AM  Result Value Ref Range Status   MRSA by PCR POSITIVE (A) NEGATIVE Final    Comment:        The GeneXpert MRSA Assay (FDA approved for NASAL specimens only), is one component of a comprehensive MRSA colonization surveillance program. It is not intended to diagnose MRSA infection nor to guide or monitor treatment for MRSA infections. RESULT CALLED TO, READ BACK BY AND VERIFIED WITH: BASS,S RN 0304 02/23/14  MITCHELL,L    Studies/Results: Mr Cervical Spine Wo Contrast  02/25/2014   CLINICAL DATA:  [place] out of bed on 02/22/2014. Progressive weakness.  EXAM: MRI CERVICAL SPINE WITHOUT CONTRAST  TECHNIQUE: Multiplanar, multisequence MR  imaging of the cervical spine was performed. No intravenous contrast was administered.  COMPARISON:  None.  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 Severe degenerative cervical spondylosis with severe multilevel disc disease and facet disease. Moderate endplate reactive changes noted throughout the cervical spine but no definite acute cervical spine fracture. The facets are normally aligned. No facet fractures. No abnormal STIR signal intensity in the posterior elements or paraspinal muscles.  There is significant compression of the cervical spinal cord at C4-5 with cord thinning and myelomalacia. Just above this level, at C3-4, there is a focal area of increased cord signal intensity which is worrisome for a cord contusion. The remainder of the cervical cord is unremarkable. There is moderate pannus formation at C1-2 with mild mass effect on the upper cervical cord.  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 No spinal or foraminal stenosis.  C3-4: Severe degenerative disc disease with partial interbody fusion. There is a bulging annulus, osteophytic ridging and uncinate spurring with flattening of the ventral thecal sac and narrowing of the ventral CSF space. There is also a left foraminal stenosis.  C4-5: Broad-based disc protrusion, osteophytic ridging and uncinate spurring with significant mass effect on the ventral thecal sac and bilateral foraminal stenosis, left greater than right.  C5-6: Broad-based right paracentral disc protrusion, osteophytic ridging and uncinate spurring. There is mass effect on the thecal sac asymmetric right and right greater than left foraminal stenosis.  C6-7: Diffuse bulging annulus, osteophytic ridging and uncinate  spurring with mild spinal and mild to moderate bilateral foraminal stenosis.  C7-T1: Shallow disc osteophyte complexes bilaterally with bilateral foraminal stenosis.  Additional findings include thyroid goiter with a large right-sided thyroid cyst. An endotracheal tube is noted with fluid in the pharynx.  IMPRESSION: 1. Probable chronic cervical cord myelomalacia at C4-5 with fairly tight multifactorial spinal stenosis. 2. Suspect acute cord contusion at C3-4. 3. 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 4. Probable auto interbody fusion at C3-4. 5. Multilevel spinal and foraminal stenosis as discussed above at the individual levels. These results will be called to the ordering clinician or representative by the Radiologist Assistant, and communication documented in the PACS or zVision Dashboard.   Electronically Signed   By: Kalman Jewels M.D.   On: 02/25/2014 14:56   Medications: I have reviewed the patient's current medications. Scheduled Meds: . aspirin EC  81 mg Oral Daily  . calcitRIOL  0.25 mcg Oral Q M,W,F  . Chlorhexidine Gluconate Cloth  6 each Topical Q0600  . docusate sodium  100 mg Oral QHS  . doxycycline  100 mg Oral Q12H  . feeding supplement (GLUCERNA SHAKE)  237 mL Oral BID  . fluocinonide ointment  1 application Topical BID  . heparin  5,000 Units Subcutaneous 3 times per day  . multivitamin with minerals  1 tablet Oral Daily  . mupirocin ointment  1 application Nasal BID  . polycarbophil  1,250 mg Oral Daily  . terazosin  1 mg Oral QHS  . tiotropium  18 mcg Inhalation Daily   Continuous Infusions: . sodium chloride 20 mL/hr at 02/25/14 1300   PRN Meds:.albuterol, fentaNYL, HYDROcodone-acetaminophen, LORazepam, meperidine (DEMEROL) injection, promethazine Assessment/Plan: Principal Problem:   Weakness Active Problems:   Acute renal failure   Chronic kidney disease (CKD), stage IV (severe)   COPD (chronic obstructive pulmonary disease)    Thrombocytopenia   Anemia   Hand lesion   Hypertension   Malnutrition of moderate degree  #Weakness secondary to  cervical spine stenosis: Mr Bochenek does have a history of several TIAs, dates unknown with residual left-sided weakness. Patient presenting with increased weakness in 4 extremities for 2 weeks preceding admission followed by fall. Cervical MRI with anesthesia remarkable for probable cervical cord myelomalacia at C4-C5 with tight multifactorial spinal stenosis. -Neurosurgery has been consulted and may need surgery -carotid dopplers WNL -hemoglobin A1c, lipid panel WNL -ASA 325 mg po daily -neuro checks q2h  #Peripheral Neuropathy: Mr Newsome describes a peripheral neuropathy with decreased sensaton in his hands and arms. He has history of DM2 but last A1c in our EMR 03/2008 was 5.9. His family says his A1c has been well controlled at the [place] but these records are unavailable. He is not on any diabetic medications. He also worked his whole life as a Psychologist, sport and exercise and may have arthritis. -hemoglobin a1c 5.5 on 1/26 -cont home norco 5-325 q6hprn  #L hand pustule with cellulitis: Mr Nudd has the pustule on his L 3rd metacarpal-phalangeal joint which expressed scant serosanguinous fluid on pressure and surrounding erythema. No signs of systemic infection with 0/4 SIRS criteria.  -wound culture growing MRSA being tx appropriately w/ doxy 100mg  BID -BCx x 2 NGTD  #Acute on CKD:  Patient had stabilized around a creatinine of 4.2.  -can have pt follow up with outpatient nephrology  #DM2: Well-controlled with a hemoglobin A1c of 5.5. CBGs have remained in the 90s to low 100s. -add SSI if needed  #Anemia:  hemoglobin of 9.3 from 10.8. Patient has continued to have a down trend during this admission. -will continue to monitor -LDH 185 WNL -anemia panel consistent w/ anemia of chronic disease -fecal occult blood card pending  #CHF: Likely non-contributory as he has no signs of fluid overload on  exam. Last echo 07/2012 notable for LVEF 44-03%, grade 1 diastolic dysfunction, increased relative contribution of atrial contraction to ventricular filling, moderate tricuspid regurgitation. At home he takes ASA 81 mg daily, metoprolol 50 mg daily.  -consider resumption of lopressor, though pending further surgical evaluation. -continue ASA 81 mg daily  #COPD: Mr Davidow is sating 100% on RA in no respiratory distress since presentation. At home he uses 2.5 L O2 at nights, spiriva 18 mcg inh daily, and albuterol 2 puff inh q6hprn. -continue spiriva 18 mcg inh daily, albuterol 2 puff q6hprn  #HTN:  At home he uses ASA 81 mg daily, metoprolol 50 mg daily. Patient remains mostly normotensive. -consider resumption of lopressor, though pending further surgical evaluation. -continue ASA 81 mg daily  #Thrombocytopenia: Presenting platelet 110. On previous admission in 07/2012, his platelets ranged from 94-199 and was previously attributed to his critical illness but cause is unclear as it is persistent. He does not appear to have liver disease as AST and ALT wnl.  #Psoriasis: Mr Knecht has plaques on his b/l knees and R elbow. At home he uses flucinonide ointment bid -cont flucinonide ointment bid  #Prostate cancer s/p prostatectomy: Mr Cena's family said he had a prostatectomy about ten years ago for prostate cancer. When asked if there were any issues since, they said he has a brain mass that the doctors are not concerned about. However, no brain mass was noted on head CT wo contrast. At home he is on terazosin 1 mg qhs -MRI/MRA head brain wo contrast completed, notes a rt frontal lobe remote infarct w/ encephalomalacia that does not represent a mass since there has not been any changes since 2014 -cont terazosin 1 mg qhs - PSA elevated at  5.54, will need to compare with previous PSA values.  #Diet: carb modified  #DVT PPx: heparin 5000 u [place] tid  #Code: full  Dispo: Disposition is deferred at  this time, awaiting improvement of current medical problems. Anticipated discharge in approximately 1-2 day(s).   The patient does have a current PCP in the [place] [facility] and does need an [facility] hospital follow-up appointment after discharge.  The patient does not know have transportation limitations that hinder transportation to clinic appointments. .Services Needed at time of discharge: Y = Yes, Blank = No PT:   OT:   RN:   Equipment:   Other:     LOS: 3 days   Luan Moore, MD 02/25/2014, 4:33 PM

## 2014-02-26 ENCOUNTER — Encounter (HOSPITAL_COMMUNITY): Payer: Self-pay | Admitting: Radiology

## 2014-02-26 LAB — CBC
HEMATOCRIT: 29.8 % — AB (ref 39.0–52.0)
HEMOGLOBIN: 9 g/dL — AB (ref 13.0–17.0)
MCH: 24.7 pg — ABNORMAL LOW (ref 26.0–34.0)
MCHC: 30.2 g/dL (ref 30.0–36.0)
MCV: 81.6 fL (ref 78.0–100.0)
Platelets: 164 10*3/uL (ref 150–400)
RBC: 3.65 MIL/uL — ABNORMAL LOW (ref 4.22–5.81)
RDW: 15.3 % (ref 11.5–15.5)
WBC: 7.3 10*3/uL (ref 4.0–10.5)

## 2014-02-26 LAB — GLUCOSE, CAPILLARY
GLUCOSE-CAPILLARY: 85 mg/dL (ref 70–99)
GLUCOSE-CAPILLARY: 80 mg/dL (ref 70–99)
GLUCOSE-CAPILLARY: 85 mg/dL (ref 70–99)
GLUCOSE-CAPILLARY: 110 mg/dL — AB (ref 70–99)
GLUCOSE-CAPILLARY: 102 mg/dL — AB (ref 70–99)
Glucose-Capillary: 87 mg/dL (ref 70–99)

## 2014-02-26 MED ORDER — DOXYCYCLINE HYCLATE 100 MG PO TABS: 100.0000 mg | ORAL_TABLET | Freq: Two times a day (BID) | ORAL | Status: DC

## 2014-02-26 NOTE — Progress Notes (Signed)
Subjective: Pt has no complaints. States he is still having b/l weakness.   Discussed w/ [facility] this [facility]. They would like to proceed w/ transfer  to the [place].   Objective: Vital signs in last 24 hours: Filed Vitals:   02/25/14 2113 02/26/14 0036 02/26/14 0614 02/26/14 1044  BP: 119/62 108/59 111/61 112/89  Pulse: 79 86 78 73  Temp: 99.1 F (37.3 C) 99.3 F (37.4 C) 99.1 F (37.3 C) 98.8 F (37.1 C)  TempSrc: Oral Oral Oral Oral  Resp: 18 20 18 16   Height:      Weight:      SpO2: 99% 97% 98% 98%   Weight change:   Intake/Output Summary (Last 24 hours) at 02/26/14 1250 Last data filed at 02/26/14 0900  Gross per 24 hour  Intake    690 ml  Output    600 ml  Net     90 ml   General: NAD, pleasant, lying in bed Lungs: CTAB, no wheezing Cardiac: RRR, no murmurs GI: soft, active bowel sounds Neuro: rt side 5/5 strength, lt side 4/5 strength, AAOx3 Ext: No pitting edema, anterior bilateral lower extremity hyperpigmented plaques with overlying scale. Left hand cellulitis healing well.   Lab Results: Basic Metabolic Panel:  Recent Labs Lab 02/23/14 0735 02/24/14 0456  NA 147* 145  K 4.4 4.6  CL 114* 112  CO2 25 25  GLUCOSE 93 108*  BUN 32* 35*  CREATININE 4.24* 4.27*  CALCIUM 9.0 9.0   Liver Function Tests:  Recent Labs Lab 02/22/14 1355  AST 31  ALT 11  ALKPHOS 55  BILITOT 0.6  PROT 5.7*  ALBUMIN 2.9*   CBC:  Recent Labs Lab 02/22/14 1355  02/24/14 0456 02/26/14 0542  WBC 9.5  < > 7.2 7.3  NEUTROABS 7.5  --  5.3  --   HGB 10.8*  < > 9.3* 9.0*  HCT 34.9*  < > 31.1* 29.8*  MCV 80.4  < > 82.3 81.6  PLT 110*  < > 151 164  < > = values in this interval not displayed. Cardiac Enzymes:  Recent Labs Lab 02/22/14 1355 02/24/14 1030  CKTOTAL  --  297*  CKMB  --  2.9  TROPONINI <0.03  --    CBG:  Recent Labs Lab 02/25/14 1158 02/25/14 1508 02/25/14 1712 02/25/14 2115 02/26/14 0631 02/26/14 1140  GLUCAP 94 87 80 101*  87 85   Fasting Lipid Panel:  Recent Labs Lab 02/23/14 0735  CHOL 151  HDL 46  LDLCALC 88  TRIG 85  CHOLHDL 3.3   Thyroid Function Tests:  Recent Labs Lab 02/23/14 0735  TSH 1.321   Anemia Panel:  Recent Labs Lab 02/22/14 2250 02/23/14 0014  VITAMINB12  --  400  FOLATE  --  8.5  FERRITIN  --  115  TIBC  --  170*  IRON  --  32*  RETICCTPCT 1.0  --    Urinalysis:  Recent Labs Lab 02/22/14 1349  COLORURINE YELLOW  LABSPEC 1.017  PHURINE 5.0  GLUCOSEU NEGATIVE  HGBUR MODERATE*  BILIRUBINUR NEGATIVE  KETONESUR NEGATIVE  PROTEINUR 30*  UROBILINOGEN 0.2  NITRITE NEGATIVE  LEUKOCYTESUR NEGATIVE    Micro Results: Recent Results (from the past 240 hour(s))  Urine culture     Status: None   Collection Time: 02/22/14  1:49 PM  Result Value Ref Range Status   Specimen Description URINE, CLEAN CATCH  Final   Special Requests NONE  Final   Colony  Count   Final    30,000 COLONIES/ML Performed at [facility]    Culture   Final    Multiple bacterial morphotypes present, none predominant. Suggest appropriate recollection if clinically indicated. Performed at [facility]    Report Status 02/23/2014 FINAL  Final  Culture, blood (routine x 2)     Status: None (Preliminary result)   Collection Time: 02/22/14  8:55 PM  Result Value Ref Range Status   Specimen Description BLOOD RIGHT HAND  Final   Special Requests BOTTLES DRAWN AEROBIC ONLY 5CC  Final   Culture   Final           BLOOD CULTURE RECEIVED NO GROWTH TO DATE CULTURE WILL BE HELD FOR 5 DAYS BEFORE ISSUING A FINAL NEGATIVE REPORT Performed at [facility]    Report Status PENDING  Incomplete  Culture, blood (routine x 2)     Status: None (Preliminary result)   Collection Time: 02/22/14 10:50 PM  Result Value Ref Range Status   Specimen Description BLOOD ARM LEFT  Final   Special Requests BOTTLES DRAWN AEROBIC AND ANAEROBIC 5CC EA  Final   Culture   Final           BLOOD  CULTURE RECEIVED NO GROWTH TO DATE CULTURE WILL BE HELD FOR 5 DAYS BEFORE ISSUING A FINAL NEGATIVE REPORT Performed at [facility]    Report Status PENDING  Incomplete  Wound culture     Status: None   Collection Time: 02/22/14 11:31 PM  Result Value Ref Range Status   Specimen Description WOUND LEFT HAND  Final   Special Requests NONE  Final   Gram Stain   Final    NO WBC SEEN NO ORGANISMS SEEN Performed at [facility]    Culture   Final    MODERATE METHICILLIN RESISTANT STAPHYLOCOCCUS AUREUS Note: RIFAMPIN AND GENTAMICIN SHOULD NOT BE USED AS SINGLE DRUGS FOR TREATMENT OF STAPH INFECTIONS. This organism DOES NOT demonstrate inducible Clindamycin resistance in vitro. CRITICAL RESULT CALLED TO, READ BACK BY AND VERIFIED WITH: CNSIAN P @ 1058  ON 02/25/14 BY COOKA Performed at [facility]    Report Status 02/25/2014 FINAL  Final   Organism ID, Bacteria METHICILLIN RESISTANT STAPHYLOCOCCUS AUREUS  Final      Susceptibility   Methicillin resistant staphylococcus aureus - MIC*    CLINDAMYCIN <=0.25 SENSITIVE Sensitive     ERYTHROMYCIN >=8 RESISTANT Resistant     GENTAMICIN <=0.5 SENSITIVE Sensitive     LEVOFLOXACIN 0.25 SENSITIVE Sensitive     OXACILLIN >=4 RESISTANT Resistant     PENICILLIN >=0.5 RESISTANT Resistant     RIFAMPIN <=0.5 SENSITIVE Sensitive     TRIMETH/SULFA <=10 SENSITIVE Sensitive     VANCOMYCIN 1 SENSITIVE Sensitive     TETRACYCLINE <=1 SENSITIVE Sensitive     * MODERATE METHICILLIN RESISTANT STAPHYLOCOCCUS AUREUS  MRSA PCR Screening     Status: Abnormal   Collection Time: 02/23/14 12:02 AM  Result Value Ref Range Status   MRSA by PCR POSITIVE (A) NEGATIVE Final    Comment:        The GeneXpert MRSA Assay (FDA approved for NASAL specimens only), is one component of a comprehensive MRSA colonization surveillance program. It is not intended to diagnose MRSA infection nor to guide or monitor treatment for MRSA infections. RESULT  CALLED TO, READ BACK BY AND VERIFIED WITH: BASS,S RN 0304 02/23/14 MITCHELL,L    Studies/Results: Ct Cervical Spine Wo Contrast  02/25/2014  ADDENDUM REPORT: 02/25/2014 19:55  ADDENDUM: Study discussed by telephone with Dr. Sherley Bounds on 02/25/2014 at 19:54 .  We discussed the nature of the patient's anatomy (with multi level congenital and acquired cervical fusion), and the suspicious findings up acute injury at C4-C5.  We discussed that it is possible that the patient sustained both acute anterior ligamentous and left facet injury at C4-C5 without disrupting the middle column.   Electronically Signed   By: Lars Pinks M.D.   On: 02/25/2014 19:55   02/25/2014   CLINICAL DATA:  81 year old male with cervical spinal stenosis, progressive weakness after a fall, MRI suspicion of cervical cord contusion. Initial encounter.  EXAM: CT CERVICAL SPINE WITHOUT CONTRAST  TECHNIQUE: Multidetector CT imaging of the cervical spine was performed without intravenous contrast. Multiplanar CT image reconstructions were also generated.  COMPARISON:  Cervical spine MRI 1346 hr the same day.  FINDINGS: Visualized skull base is intact. No atlanto-occipital dissociation. Partially calcified bulky ligamentous hypertrophy about the odontoid.  Congenital incomplete segmentation of the C2-C3, and possibly also the and C3-C4, level(s). However, there is bulky circumferential endplate osteophytosis (eccentric to the left) at C3-C4 as well as left facet hypertrophy.  Subsequent moderate to severe disc, endplate, and facet degeneration at C4-C5.  Bulky endplate osteophytosis eccentric to the right then noted from the C5 to the C7 level, with interbody ankylosis at C6-C7. Severe disc space loss with some vacuum disc phenomena at these moderate to severe left side levels. C7-T1 facet hypertrophy, as well as vacuum disc and circumferential disc osteophyte complex at that level.  Bilateral posterior element alignment is within normal limits. No  acute cervical spine fracture identified. Visible upper thoracic levels appear grossly intact.  Multilevel multifactorial spinal and foraminal stenosis result, as detailed by MRI earlier today.  Retropharyngeal course of the right greater than left carotid arteries. There is also a small volume of prevertebral fluid evident at the C4-C5 level, where left side facet joint fluid was visible earlier today. No definite C4-C5 facet or vertebral fracture is identified.  Upper lobe pulmonary emphysema partially visible.  IMPRESSION: 1. Constellation of imaging findings raise the possibility of acute C4-C5 level ligamentous injury with associated left facet diastases. No associated fracture is identified. However, this could be an unstable injury. 2. Severe cervical spine degeneration with underlying ankylosis from the C2 to the C4 level (in part due to congenital incomplete segmentation), and also the C6-C7 level. 3. Associated multilevel severe cervical spinal and foraminal stenosis as detailed on the MRI earlier today. 4. Note also right greater than left retropharyngeal course of the carotid arteries.  Electronically Signed: By: Lars Pinks M.D. On: 02/25/2014 19:31   Mr Cervical Spine Wo Contrast  02/25/2014   CLINICAL DATA:  [place] out of bed on 02/22/2014. Progressive weakness.  EXAM: MRI CERVICAL SPINE WITHOUT CONTRAST  TECHNIQUE: Multiplanar, multisequence MR imaging of the cervical spine was performed. No intravenous contrast was administered.  COMPARISON:  None.  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 Severe degenerative cervical spondylosis with severe multilevel disc disease and facet disease. Moderate endplate reactive changes noted throughout the cervical spine but no definite acute cervical spine fracture. The facets are normally aligned. No facet fractures. No abnormal STIR signal intensity in the posterior elements or paraspinal muscles.  There is significant compression of the cervical  spinal cord at C4-5 with cord thinning and myelomalacia. Just above this level, at C3-4, there is a focal area of increased cord signal intensity  which is worrisome for a cord contusion. The remainder of the cervical cord is unremarkable. There is moderate pannus formation at C1-2 with mild mass effect on the upper cervical cord.  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 No spinal or foraminal stenosis.  C3-4: Severe degenerative disc disease with partial interbody fusion. There is a bulging annulus, osteophytic ridging and uncinate spurring with flattening of the ventral thecal sac and narrowing of the ventral CSF space. There is also a left foraminal stenosis.  C4-5: Broad-based disc protrusion, osteophytic ridging and uncinate spurring with significant mass effect on the ventral thecal sac and bilateral foraminal stenosis, left greater than right.  C5-6: Broad-based right paracentral disc protrusion, osteophytic ridging and uncinate spurring. There is mass effect on the thecal sac asymmetric right and right greater than left foraminal stenosis.  C6-7: Diffuse bulging annulus, osteophytic ridging and uncinate spurring with mild spinal and mild to moderate bilateral foraminal stenosis.  C7-T1: Shallow disc osteophyte complexes bilaterally with bilateral foraminal stenosis.  Additional findings include thyroid goiter with a large right-sided thyroid cyst. An endotracheal tube is noted with fluid in the pharynx.  IMPRESSION: 1. Probable chronic cervical cord myelomalacia at C4-5 with fairly tight multifactorial spinal stenosis. 2. Suspect acute cord contusion at C3-4. 3. 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 4. Probable auto interbody fusion at C3-4. 5. Multilevel spinal and foraminal stenosis as discussed above at the individual levels. These results will be called to the ordering clinician or representative by the Radiologist Assistant, and communication documented in the  PACS or zVision Dashboard.   Electronically Signed   By: Kalman Jewels M.D.   On: 02/25/2014 14:56   Medications: I have reviewed the patient's current medications. Scheduled Meds: . aspirin EC  81 mg Oral Daily  . calcitRIOL  0.25 mcg Oral Q M,W,F  . Chlorhexidine Gluconate Cloth  6 each Topical Q0600  . docusate sodium  100 mg Oral QHS  . doxycycline  100 mg Oral Q12H  . feeding supplement (GLUCERNA SHAKE)  237 mL Oral BID  . fluocinonide ointment  1 application Topical BID  . heparin  5,000 Units Subcutaneous 3 times per day  . multivitamin with minerals  1 tablet Oral Daily  . mupirocin ointment  1 application Nasal BID  . polycarbophil  1,250 mg Oral Daily  . terazosin  1 mg Oral QHS  . tiotropium  18 mcg Inhalation Daily   Continuous Infusions: . sodium chloride 20 mL/hr at 02/25/14 1300   PRN Meds:.albuterol, fentaNYL, HYDROcodone-acetaminophen, LORazepam, meperidine (DEMEROL) injection, promethazine Assessment/Plan: Principal Problem:   Weakness Active Problems:   Acute renal failure   Chronic kidney disease (CKD), stage IV (severe)   COPD (chronic obstructive pulmonary disease)   Thrombocytopenia   Anemia   Hand lesion   Hypertension   Malnutrition of moderate degree   Cervical stenosis of spinal canal  #Weakness secondary to cervical spine stenosis: Cervical MRI with anesthesia remarkable for probable cervical cord myelomalacia at C4-C5 with tight multifactorial spinal stenosis and possible acute contusion at C3-C4. CT c spine yesterday reveals possible acute anterior ligamentous and left facet injury w/o disrupting the middle column.  - CT scan of c spine reveals potential fracture at C4-C5, will transfer pt w/ aspen c-collar -Neurosurgery saw patient, recommend cervical decompression and stablization. Stable for d/c to the [facility] hospital for surgery there.   #DM 2 w/Peripheral Neuropathy: decreased sensation in all 4 ext. hemoglobin a1c 5.5 on 1/26 -cont home norco  5-325 q6hprn  #L hand pustule with cellulitis: Mr Fell has the pustule on his L 3rd metacarpal-phalangeal joint which expressed scant serosanguinous fluid on pressure and surrounding erythema. No signs of systemic infection with 0/4 SIRS criteria.  -wound culture growing MRSA being tx appropriately w/ doxy 100mg  BID -BCx x 2 NGTD  #CKD:  Patient had stabilized around a creatinine of 4.2.  -can have pt follow up with outpatient nephrology   #Anemia:  hemoglobin of 9.0 from 9.3. Patient has continued to have a down trend during this admission. -will continue to monitor -LDH 185 WNL -anemia panel consistent w/ anemia of chronic disease -fecal occult blood card pending  #CHF: Likely non-contributory as he has no signs of fluid overload on exam. Last echo 07/2012 notable for LVEF 56-31%, grade 1 diastolic dysfunction, increased relative contribution of atrial contraction to ventricular filling, moderate tricuspid regurgitation. At home he takes ASA 81 mg daily, metoprolol 50 mg daily.  -holding lopressor, currently normotensive -continue ASA 81 mg daily  #COPD: At home he uses 2.5 L O2 at nights, spiriva 18 mcg inh daily, and albuterol 2 puff inh q6hprn. Currently on 2L O2 [place] -continue spiriva 18 mcg inh daily, albuterol 2 puff q6hprn  #HTN:  At home he uses ASA 81 mg daily, metoprolol 50 mg daily. Patient remains mostly normotensive. -normotensive, holding lopressor -continue ASA 81 mg daily  #Thrombocytopenia: Presenting platelet 110. On previous admission in 07/2012, his platelets ranged from 94-199 and was previously attributed to his critical illness but cause is unclear as it is persistent. He does not appear to have liver disease as AST and ALT wnl. Platelets today 164. Will cont to monitor.   #Psoriasis: Mr Featherston has plaques on his b/l knees and R elbow. At home he uses flucinonide ointment bid -cont flucinonide ointment bid  #Prostate cancer s/p prostatectomy: Mr Emminger's family  said he had a prostatectomy about ten years ago for prostate cancer. When asked if there were any issues since, they said he has a brain mass that the doctors are not concerned about. However, no brain mass was noted on head CT wo contrast. At home he is on terazosin 1 mg qhs -MRI/MRA head brain wo contrast completed, notes a rt frontal lobe remote infarct w/ encephalomalacia that does not represent a mass since there has not been any changes since 2014 -cont terazosin 1 mg qhs - PSA elevated at 5.54, will need to compare with previous PSA values.  #Diet: carb modified  #DVT PPx: heparin 5000 u [place] tid  #Code: full  Dispo: pt stable for transfer to [place] today.   The patient does have a current PCP in the [place] medical system and does need an [facility] hospital follow-up appointment after discharge.  The patient does not know have transportation limitations that hinder transportation to clinic appointments. .Services Needed at time of discharge: Y = Yes, Blank = No PT:   OT:   RN:   Equipment:   Other:     LOS: 4 days   Julious Oka, MD 02/26/2014, 12:50 PM

## 2014-02-26 NOTE — Progress Notes (Signed)
   02/26/14 1300  Clinical Encounter Type  Visited With Patient  Visit Type Follow-up  Referral From Family;Chaplain  Advance Directives (For Healthcare)  Does patient have an advance directive? Yes   Chaplain was contacted to complete an advanced directive for patient. Patient was alert oriented and aware of the decisions he had made in the advanced directive document. Document was filled out correctly, signed, and notarized. Family has copies of the document and a copy is in the patient's chart. Contact spiritual care department if further assistance needed. Livian Vanderbeck R, Chaplain  1:30 PM 1:30 PM

## 2014-02-26 NOTE — Progress Notes (Signed)
°   02/26/14 1246  Clinical Encounter Type  Visited With Patient and family together  Visit Type Initial  Referral From Nurse  Spiritual Encounters  Spiritual Needs Other (Comment)  Stress Factors  Patient Stress Factors None identified  Family Stress Factors Health changes  Advance Directives (For Healthcare)  Does patient have an advance directive? No  Would patient like information on creating an advanced directive? Yes - Scientist, clinical (histocompatibility and immunogenetics) given   Nurse asked that I go over Advance Directive with patient. Pt was alert and family was present.  I went over the information and left to seek further guidance.  I called and asked about the signatures and was informed that a Chaplain would be up shortly to assist.  Keenan Bachelor came to assist the family.  I observed.Drue Dun, Chaplain Intern 02/26/2014, 12:51 PM

## 2014-02-26 NOTE — Progress Notes (Signed)
Orthopedic Tech Progress Note Patient Details:  Brad Jones [birth year] [id number]  Patient ID: Brad Jones, male   DOB: 23-Jul-1927, 81 y.o.   MRN: [id number] Called in bio-tech brace order; spoke with Erlene Senters, Orest Dygert 02/26/2014, 2:01 PM

## 2014-02-26 NOTE — Progress Notes (Addendum)
PT Cancellation Note  Patient Details Name: Brad Jones MRN: [id number] DOB: 1927-09-25   Cancelled Treatment:    Reason Eval/Treat Not Completed: Patient not medically ready. Noted Dr. Ronnald Ramp has ordered pt to have a cervical collar due to deficits noted on cervical MRI. Collar is not yet in place. PT treatment deferred. Noted also plans to transfer to the [facility] hospital system.  Please clarify if pt is allowed activity as tolerated while in cervical collar.   [name] 02/26/2014, 3:15 PM  Pager [phone]

## 2014-02-26 NOTE — Progress Notes (Signed)
Noted Neurosurgery plan. We await final disposition and planning of surgery [phone]

## 2014-02-26 NOTE — Progress Notes (Signed)
Internal Medicine Attending  Date: 02/26/2014  Patient name: Brad Jones Medical record number: [id number] Date of birth: May 29, 1927 Age: 81 y.o. Gender: male  The [facility] refused to accept patient in transfer because he did not need emergent cervical surgery.  Dr. Hulen Luster discussed this with neurosurgeon Dr. Ronnald Ramp, who recommended pursuing CIR placement with cervical collar in place, and said that he can make arrangements for surgery next week.  If a decision is ultimately made that he would have his surgery arranged at the [facility], would make sure that arrangements are clearly in place for his follow-up with neurosurgery there.  For now, the plan is to keep patient here as inpatient and seek CIR placement.  Dr. Dareen Piano will cover as the on-call attending physician this weekend, and Dr. Eppie Gibson will take over as attending physician on Monday 03/01/2014.

## 2014-02-26 NOTE — Progress Notes (Signed)
Subjective: No change over night.   Objective: Current vital signs: BP 111/61 mmHg  Pulse 78  Temp(Src) 99.1 F (37.3 C) (Oral)  Resp 18  Ht 5\' 9"  (1.753 m)  Wt 75.6 kg (166 lb 10.7 oz)  BMI 24.60 kg/m2  SpO2 98% Vital signs in last 24 hours: Temp:  [98.8 F (37.1 C)-99.3 F (37.4 C)] 99.1 F (37.3 C) (01/29 4315) Pulse Rate:  [78-95] 78 (01/29 0614) Resp:  [18-24] 18 (01/29 0614) BP: (108-142)/(59-76) 111/61 mmHg (01/29 0614) SpO2:  [97 %-100 %] 98 % (01/29 0614)  Intake/Output from previous day: 01/28 0701 - 01/29 0700 In: 450 [I.V.:450] Out: 600 [Urine:600] Intake/Output this shift: Total I/O In: 240 [P.O.:240] Out: 100 [Urine:100] Nutritional status: Diet heart healthy/carb modified  Neurologic Exam: Gen: In bed, NAD MS: Awake, alert, gives month as Feb, but correct year.  QM:GQQPY EOMI Motor: 4+/5 on right, 4/5 on left.  Sensory:decreased sensation distally bilaterally.  DTR:2+ and symmetric in UE and LE  Lab Results: Basic Metabolic Panel:  Recent Labs Lab 02/22/14 1355 02/22/14 2250 02/23/14 0735 02/24/14 0456  NA 146* 147* 147* 145  K 4.4 4.4 4.4 4.6  CL 112 114* 114* 112  CO2 28 27 25 25   GLUCOSE 108* 142* 93 108*  BUN 34* 33* 32* 35*  CREATININE 4.27* 4.21* 4.24* 4.27*  CALCIUM 9.5 8.8 9.0 9.0    Liver Function Tests:  Recent Labs Lab 02/22/14 1355  AST 31  ALT 11  ALKPHOS 55  BILITOT 0.6  PROT 5.7*  ALBUMIN 2.9*   No results for input(s): LIPASE, AMYLASE in the last 168 hours. No results for input(s): AMMONIA in the last 168 hours.  CBC:  Recent Labs Lab 02/22/14 1355 02/23/14 0735 02/24/14 0456 02/26/14 0542  WBC 9.5 7.5 7.2 7.3  NEUTROABS 7.5  --  5.3  --   HGB 10.8* 10.1* 9.3* 9.0*  HCT 34.9* 33.1* 31.1* 29.8*  MCV 80.4 82.5 82.3 81.6  PLT 110* 100* 151 164    Cardiac Enzymes:  Recent Labs Lab 02/22/14 1355 02/24/14 1030  CKTOTAL  --  297*  CKMB  --  2.9  TROPONINI <0.03  --     Lipid  Panel:  Recent Labs Lab 02/23/14 0735  CHOL 151  TRIG 85  HDL 46  CHOLHDL 3.3  VLDL 17  LDLCALC 88    CBG:  Recent Labs Lab 02/25/14 1158 02/25/14 1508 02/25/14 1712 02/25/14 2115 02/26/14 0631  GLUCAP 94 87 80 101* 36    Microbiology: Results for orders placed or performed during the hospital encounter of 02/22/14  Urine culture     Status: None   Collection Time: 02/22/14  1:49 PM  Result Value Ref Range Status   Specimen Description URINE, CLEAN CATCH  Final   Special Requests NONE  Final   Colony Count   Final    30,000 COLONIES/ML Performed at [facility]    Culture   Final    Multiple bacterial morphotypes present, none predominant. Suggest appropriate recollection if clinically indicated. Performed at [facility]    Report Status 02/23/2014 FINAL  Final  Culture, blood (routine x 2)     Status: None (Preliminary result)   Collection Time: 02/22/14  8:55 PM  Result Value Ref Range Status   Specimen Description BLOOD RIGHT HAND  Final   Special Requests BOTTLES DRAWN AEROBIC ONLY 5CC  Final   Culture   Final  BLOOD CULTURE RECEIVED NO GROWTH TO DATE CULTURE WILL BE HELD FOR 5 DAYS BEFORE ISSUING A FINAL NEGATIVE REPORT Performed at [facility]    Report Status PENDING  Incomplete  Culture, blood (routine x 2)     Status: None (Preliminary result)   Collection Time: 02/22/14 10:50 PM  Result Value Ref Range Status   Specimen Description BLOOD ARM LEFT  Final   Special Requests BOTTLES DRAWN AEROBIC AND ANAEROBIC 5CC EA  Final   Culture   Final           BLOOD CULTURE RECEIVED NO GROWTH TO DATE CULTURE WILL BE HELD FOR 5 DAYS BEFORE ISSUING A FINAL NEGATIVE REPORT Performed at [facility]    Report Status PENDING  Incomplete  Wound culture     Status: None   Collection Time: 02/22/14 11:31 PM  Result Value Ref Range Status   Specimen Description WOUND LEFT HAND  Final   Special Requests NONE  Final    Gram Stain   Final    NO WBC SEEN NO ORGANISMS SEEN Performed at [facility]    Culture   Final    MODERATE METHICILLIN RESISTANT STAPHYLOCOCCUS AUREUS Note: RIFAMPIN AND GENTAMICIN SHOULD NOT BE USED AS SINGLE DRUGS FOR TREATMENT OF STAPH INFECTIONS. This organism DOES NOT demonstrate inducible Clindamycin resistance in vitro. CRITICAL RESULT CALLED TO, READ BACK BY AND VERIFIED WITH: CNSIAN P @ 1058  ON 02/25/14 BY COOKA Performed at [facility]    Report Status 02/25/2014 FINAL  Final   Organism ID, Bacteria METHICILLIN RESISTANT STAPHYLOCOCCUS AUREUS  Final      Susceptibility   Methicillin resistant staphylococcus aureus - MIC*    CLINDAMYCIN <=0.25 SENSITIVE Sensitive     ERYTHROMYCIN >=8 RESISTANT Resistant     GENTAMICIN <=0.5 SENSITIVE Sensitive     LEVOFLOXACIN 0.25 SENSITIVE Sensitive     OXACILLIN >=4 RESISTANT Resistant     PENICILLIN >=0.5 RESISTANT Resistant     RIFAMPIN <=0.5 SENSITIVE Sensitive     TRIMETH/SULFA <=10 SENSITIVE Sensitive     VANCOMYCIN 1 SENSITIVE Sensitive     TETRACYCLINE <=1 SENSITIVE Sensitive     * MODERATE METHICILLIN RESISTANT STAPHYLOCOCCUS AUREUS  MRSA PCR Screening     Status: Abnormal   Collection Time: 02/23/14 12:02 AM  Result Value Ref Range Status   MRSA by PCR POSITIVE (A) NEGATIVE Final    Comment:        The GeneXpert MRSA Assay (FDA approved for NASAL specimens only), is one component of a comprehensive MRSA colonization surveillance program. It is not intended to diagnose MRSA infection nor to guide or monitor treatment for MRSA infections. RESULT CALLED TO, READ BACK BY AND VERIFIED WITH: BASS,S RN 0304 02/23/14 MITCHELL,L     Coagulation Studies: No results for input(s): LABPROT, INR in the last 72 hours.  Imaging: Ct Cervical Spine Wo Contrast  02/25/2014   ADDENDUM REPORT: 02/25/2014 19:55  ADDENDUM: Study discussed by telephone with Dr. Sherley Bounds on 02/25/2014 at 19:54 .  We discussed the  nature of the patient's anatomy (with multi level congenital and acquired cervical fusion), and the suspicious findings up acute injury at C4-C5.  We discussed that it is possible that the patient sustained both acute anterior ligamentous and left facet injury at C4-C5 without disrupting the middle column.   Electronically Signed   By: Lars Pinks M.D.   On: 02/25/2014 19:55   02/25/2014   CLINICAL DATA:  81 year old male with  cervical spinal stenosis, progressive weakness after a fall, MRI suspicion of cervical cord contusion. Initial encounter.  EXAM: CT CERVICAL SPINE WITHOUT CONTRAST  TECHNIQUE: Multidetector CT imaging of the cervical spine was performed without intravenous contrast. Multiplanar CT image reconstructions were also generated.  COMPARISON:  Cervical spine MRI 1346 hr the same day.  FINDINGS: Visualized skull base is intact. No atlanto-occipital dissociation. Partially calcified bulky ligamentous hypertrophy about the odontoid.  Congenital incomplete segmentation of the C2-C3, and possibly also the and C3-C4, level(s). However, there is bulky circumferential endplate osteophytosis (eccentric to the left) at C3-C4 as well as left facet hypertrophy.  Subsequent moderate to severe disc, endplate, and facet degeneration at C4-C5.  Bulky endplate osteophytosis eccentric to the right then noted from the C5 to the C7 level, with interbody ankylosis at C6-C7. Severe disc space loss with some vacuum disc phenomena at these moderate to severe left side levels. C7-T1 facet hypertrophy, as well as vacuum disc and circumferential disc osteophyte complex at that level.  Bilateral posterior element alignment is within normal limits. No acute cervical spine fracture identified. Visible upper thoracic levels appear grossly intact.  Multilevel multifactorial spinal and foraminal stenosis result, as detailed by MRI earlier today.  Retropharyngeal course of the right greater than left carotid arteries. There is also a  small volume of prevertebral fluid evident at the C4-C5 level, where left side facet joint fluid was visible earlier today. No definite C4-C5 facet or vertebral fracture is identified.  Upper lobe pulmonary emphysema partially visible.  IMPRESSION: 1. Constellation of imaging findings raise the possibility of acute C4-C5 level ligamentous injury with associated left facet diastases. No associated fracture is identified. However, this could be an unstable injury. 2. Severe cervical spine degeneration with underlying ankylosis from the C2 to the C4 level (in part due to congenital incomplete segmentation), and also the C6-C7 level. 3. Associated multilevel severe cervical spinal and foraminal stenosis as detailed on the MRI earlier today. 4. Note also right greater than left retropharyngeal course of the carotid arteries.  Electronically Signed: By: Lars Pinks M.D. On: 02/25/2014 19:31   Mr Cervical Spine Wo Contrast  02/25/2014   CLINICAL DATA:  [place] out of bed on 02/22/2014. Progressive weakness.  EXAM: MRI CERVICAL SPINE WITHOUT CONTRAST  TECHNIQUE: Multiplanar, multisequence MR imaging of the cervical spine was performed. No intravenous contrast was administered.  COMPARISON:  None.  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 Severe degenerative cervical spondylosis with severe multilevel disc disease and facet disease. Moderate endplate reactive changes noted throughout the cervical spine but no definite acute cervical spine fracture. The facets are normally aligned. No facet fractures. No abnormal STIR signal intensity in the posterior elements or paraspinal muscles.  There is significant compression of the cervical spinal cord at C4-5 with cord thinning and myelomalacia. Just above this level, at C3-4, there is a focal area of increased cord signal intensity which is worrisome for a cord contusion. The remainder of the cervical cord is unremarkable. There is moderate pannus formation at C1-2  with mild mass effect on the upper cervical cord.  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 No spinal or foraminal stenosis.  C3-4: Severe degenerative disc disease with partial interbody fusion. There is a bulging annulus, osteophytic ridging and uncinate spurring with flattening of the ventral thecal sac and narrowing of the ventral CSF space. There is also a left foraminal stenosis.  C4-5: Broad-based disc protrusion, osteophytic ridging and uncinate spurring with significant mass  effect on the ventral thecal sac and bilateral foraminal stenosis, left greater than right.  C5-6: Broad-based right paracentral disc protrusion, osteophytic ridging and uncinate spurring. There is mass effect on the thecal sac asymmetric right and right greater than left foraminal stenosis.  C6-7: Diffuse bulging annulus, osteophytic ridging and uncinate spurring with mild spinal and mild to moderate bilateral foraminal stenosis.  C7-T1: Shallow disc osteophyte complexes bilaterally with bilateral foraminal stenosis.  Additional findings include thyroid goiter with a large right-sided thyroid cyst. An endotracheal tube is noted with fluid in the pharynx.  IMPRESSION: 1. Probable chronic cervical cord myelomalacia at C4-5 with fairly tight multifactorial spinal stenosis. 2. Suspect acute cord contusion at C3-4. 3. 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 4. Probable auto interbody fusion at C3-4. 5. Multilevel spinal and foraminal stenosis as discussed above at the individual levels. These results will be called to the ordering clinician or representative by the Radiologist Assistant, and communication documented in the PACS or zVision Dashboard.   Electronically Signed   By: Kalman Jewels M.D.   On: 02/25/2014 14:56    Medications:  Scheduled: . aspirin EC  81 mg Oral Daily  . calcitRIOL  0.25 mcg Oral Q M,W,F  . Chlorhexidine Gluconate Cloth  6 each Topical Q0600  . docusate sodium  100  mg Oral QHS  . doxycycline  100 mg Oral Q12H  . feeding supplement (GLUCERNA SHAKE)  237 mL Oral BID  . fluocinonide ointment  1 application Topical BID  . heparin  5,000 Units Subcutaneous 3 times per day  . multivitamin with minerals  1 tablet Oral Daily  . mupirocin ointment  1 application Nasal BID  . polycarbophil  1,250 mg Oral Daily  . terazosin  1 mg Oral QHS  . tiotropium  18 mcg Inhalation Daily    Assessment/Plan:  81 yo M with progressive generalized weakness. MRI C-Spine does show significant spondylosis throughout the cervical spine with significant spinal stenosis at C4-5 with signal change in the cord from C3-4 to C4-5. Neurosurgery currently involved and discussing with patient possible surgery. Neurology will S/O at this time.    Etta Quill PA-C [facility] [phone]  02/26/2014, 11:00 AM

## 2014-02-26 NOTE — Progress Notes (Signed)
Internal Medicine Attending  Date: 02/26/2014  Patient name: Joziah Dollins Medical record number: [id number] Date of birth: 12-05-1927 Age: 81 y.o. Gender: male  I saw and evaluated the patient, and discussed his care with resident.  I reviewed the resident's note by Dr. Hulen Luster and I agree with the resident's findings and plans as documented in her note, with the following additional comments.  MRI 1/28 showed cervical spinal stenosis and a suspected acute cord contusion at C3-4; patient was seen by neurosurgeon Dr. Sherley Bounds, who obtained a CT scan of the cervical spine which showed findings which raised the possibility of acute C4-C5 level ligamentous injury with associated left facet diastases.  Patient and family have requested transfer to [facility] since a bed is available there, and neurosurgeon Dr. Ronnald Ramp feels that he is stable for discharge and transfer to the [facility], and has advised that he remain in a cervical collar.  Dr. Ronnald Ramp feels that he will need cervical decompression and stabilization, most likely from a posterior approach.  Case manager will arrange transfer to the [facility] as requested by patient and family; he will need to be under the care of a neurosurgeon there, and I have requested that I complete copy of his chart be sent with him.

## 2014-02-26 NOTE — Progress Notes (Signed)
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 H says that he has had worsening L sided weakness of his arm and leg for the past two weeks. He had an unwitnessed fall on Friday 1/22.  According to MRI of head:  No acute infarct. MRI of spine revealed:  Multilevel spinal and foraminal stenosis    OT comments  Patient seen for functional strengthening exercises, bed mobility, and functional transfer. Patient with new diagnosis of "significant spinal stenosis at C4-5 with signal change in the spinal cord". PT/OT orders have not changed. Patient continues to present with weakness > LUE/LE. Patient with increased pain upon entering room and required mod encouragement to work with therapist. Patient sat EOB with min assist and while seated EOB stated he started feeling better. LUE exercises completed, see below. Transfer > recliner completed, see below. Depending on next intervention, continue to believe patient will benefit from CIR for comprehensive rehabilitation to safely discharge back home eventually.    Follow Up Recommendations  CIR;Supervision/Assistance - 24 hour    Equipment Recommendations   (TBD)    Recommendations for Other Services Rehab consult    Precautions / Restrictions Precautions Precautions: Fall Restrictions Weight Bearing Restrictions: No       Mobility Bed Mobility Overal bed mobility: Needs Assistance Bed Mobility: Rolling;Supine to Sit Rolling: Min assist   Supine to sit: Min assist     General bed mobility comments: Patient required mod encouragement to participate in therapy session. Once seated on EOB, patient smiling and stated his pain was better.   Transfers   Equipment  used: None Transfers: Best boy pivot transfers: Min assist     General transfer comment: Patient performed squat pivot transfer from EOB> recliner with min verbal cues for technique and min physical assistance from therapist                        Cognition   Behavior During Therapy: [facility] for tasks assessed/performed Overall Cognitive Status: Within Functional Limits for tasks assessed      Exercises General Exercises - Upper Extremity Shoulder Flexion: AAROM;Left;10 reps Shoulder Extension: AAROM;Left;10 reps Shoulder ABduction: AAROM;10 reps;Left Shoulder ADduction: AAROM;10 reps;Left Elbow Flexion: AAROM;10 reps;Left Elbow Extension: AAROM;10 reps;Left Hand Exercises Forearm Supination: AAROM;Left;10 reps Forearm Pronation: AAROM;Left;10 reps Opposition: AROM;Left;10 reps           Pertinent Vitals/ Pain       Pain Assessment: Faces Faces Pain Scale: Hurts even more (during bed mobility) Pain Location: back Pain Descriptors / Indicators: Aching Pain Intervention(s): Monitored during session;Repositioned         Frequency Min 2X/week     Progress Toward Goals  OT Goals(current goals can now be found in the care plan section)  Progress towards OT goals: Progressing toward goals     Plan Discharge plan remains appropriate       End of Session    Activity Tolerance Patient tolerated treatment well   Patient Left in chair;with call bell/phone within reach     Time: 1135-1158 OT Time Calculation (min): 23 min  Charges: OT General Charges $OT Visit: 1 Procedure OT Treatments $Therapeutic Activity: 8-22 mins $Therapeutic Exercise:  8-22 mins  Zharia Conrow , MS, OTR/L, CLT Pager: [phone]  02/26/2014, 12:07 PM

## 2014-02-26 NOTE — Progress Notes (Signed)
Patient was seen and examined. He continues to complain of coldness in his hands and left leg pain. No change in his physical exam. I had a long discussion with the radiologist last night about his CT scan of his cervical spine and there is some concern that it may be a potential fracture at C4-5. This is difficult to know. We will put him in a cervical collar.   Do think he will need cervical decompression and stabilization, most likely from a posterior approach. The patient seems to be amenable to this. He is stable for discharge and transfer to the [facility] from my standpoint. He should remain in the cervical collar.

## 2014-02-27 LAB — CBC WITH DIFFERENTIAL/PLATELET
Basophils Absolute: 0 10*3/uL (ref 0.0–0.1)
Basophils Relative: 0 % (ref 0–1)
EOS PCT: 4 % (ref 0–5)
Eosinophils Absolute: 0.3 10*3/uL (ref 0.0–0.7)
HCT: 29.8 % — ABNORMAL LOW (ref 39.0–52.0)
HEMOGLOBIN: 9 g/dL — AB (ref 13.0–17.0)
LYMPHS ABS: 1 10*3/uL (ref 0.7–4.0)
Lymphocytes Relative: 14 % (ref 12–46)
MCH: 24.9 pg — ABNORMAL LOW (ref 26.0–34.0)
MCHC: 30.2 g/dL (ref 30.0–36.0)
MCV: 82.3 fL (ref 78.0–100.0)
MONO ABS: 0.7 10*3/uL (ref 0.1–1.0)
Monocytes Relative: 11 % (ref 3–12)
Neutro Abs: 4.8 10*3/uL (ref 1.7–7.7)
Neutrophils Relative %: 71 % (ref 43–77)
Platelets: 171 10*3/uL (ref 150–400)
RBC: 3.62 MIL/uL — ABNORMAL LOW (ref 4.22–5.81)
RDW: 15.2 % (ref 11.5–15.5)
WBC: 6.8 10*3/uL (ref 4.0–10.5)

## 2014-02-27 LAB — GLUCOSE, CAPILLARY
GLUCOSE-CAPILLARY: 106 mg/dL — AB (ref 70–99)
Glucose-Capillary: 89 mg/dL (ref 70–99)
Glucose-Capillary: 114 mg/dL — ABNORMAL HIGH (ref 70–99)

## 2014-02-27 MED ORDER — LIDOCAINE VISCOUS 2 % MT SOLN
15.0000 mL | Freq: Four times a day (QID) | OROMUCOSAL | Status: DC | PRN
  Filled 2014-02-27: qty 15

## 2014-02-27 NOTE — Progress Notes (Signed)
Subjective: Pt states left tongue ulcer hurts when he eats. Otherwise has no other complaints. Resting in bed comfortably.   Objective: Vital signs in last 24 hours: Filed Vitals:   02/27/14 0225 02/27/14 0543 02/27/14 0841 02/27/14 0930  BP: 130/50 109/70  105/56  Pulse: 76 69  77  Temp: 98.2 F (36.8 C) 98.5 F (36.9 C)  98.9 F (37.2 C)  TempSrc: Oral Oral  Oral  Resp: 18 18  18   Height:      Weight:      SpO2: 98% 98% 99% 97%   Weight change:   Intake/Output Summary (Last 24 hours) at 02/27/14 1234 Last data filed at 02/27/14 1100  Gross per 24 hour  Intake    840 ml  Output    850 ml  Net    -10 ml   General: NAD, pleasant, lying in bed HEENT: yellow ulcer on left tongue w/ irregular borders, c-collar in place Lungs: clear on anterior auscultation Cardiac: RRR, no murmurs GI: soft, active bowel sounds Neuro: rt side 5/5 strength, lt side 4/5 strength, AAOx3 Ext: No pitting edema, anterior bilateral lower extremity hyperpigmented plaques with overlying scale. Left hand cellulitis healing well.   Lab Results: Basic Metabolic Panel:  Recent Labs Lab 02/23/14 0735 02/24/14 0456  NA 147* 145  K 4.4 4.6  CL 114* 112  CO2 25 25  GLUCOSE 93 108*  BUN 32* 35*  CREATININE 4.24* 4.27*  CALCIUM 9.0 9.0   Liver Function Tests:  Recent Labs Lab 02/22/14 1355  AST 31  ALT 11  ALKPHOS 55  BILITOT 0.6  PROT 5.7*  ALBUMIN 2.9*   CBC:  Recent Labs Lab 02/22/14 1355  02/24/14 0456 02/26/14 0542  WBC 9.5  < > 7.2 7.3  NEUTROABS 7.5  --  5.3  --   HGB 10.8*  < > 9.3* 9.0*  HCT 34.9*  < > 31.1* 29.8*  MCV 80.4  < > 82.3 81.6  PLT 110*  < > 151 164  < > = values in this interval not displayed. Cardiac Enzymes:  Recent Labs Lab 02/22/14 1355 02/24/14 1030  CKTOTAL  --  297*  CKMB  --  2.9  TROPONINI <0.03  --    CBG:  Recent Labs Lab 02/26/14 0631 02/26/14 1140 02/26/14 1636 02/26/14 2140 02/27/14 0644 02/27/14 1053  GLUCAP 87 85 110*  102* 89 114*   Fasting Lipid Panel:  Recent Labs Lab 02/23/14 0735  CHOL 151  HDL 46  LDLCALC 88  TRIG 85  CHOLHDL 3.3   Thyroid Function Tests:  Recent Labs Lab 02/23/14 0735  TSH 1.321   Anemia Panel:  Recent Labs Lab 02/22/14 2250 02/23/14 0014  VITAMINB12  --  400  FOLATE  --  8.5  FERRITIN  --  115  TIBC  --  170*  IRON  --  32*  RETICCTPCT 1.0  --    Urinalysis:  Recent Labs Lab 02/22/14 1349  COLORURINE YELLOW  LABSPEC 1.017  PHURINE 5.0  GLUCOSEU NEGATIVE  HGBUR MODERATE*  BILIRUBINUR NEGATIVE  KETONESUR NEGATIVE  PROTEINUR 30*  UROBILINOGEN 0.2  NITRITE NEGATIVE  LEUKOCYTESUR NEGATIVE    Micro Results: Recent Results (from the past 240 hour(s))  Urine culture     Status: None   Collection Time: 02/22/14  1:49 PM  Result Value Ref Range Status   Specimen Description URINE, CLEAN CATCH  Final   Special Requests NONE  Final   Colony Count   Final  30,000 COLONIES/ML Performed at [facility]   Final    Multiple bacterial morphotypes present, none predominant. Suggest appropriate recollection if clinically indicated. Performed at [facility]    Report Status 02/23/2014 FINAL  Final  Culture, blood (routine x 2)     Status: None (Preliminary result)   Collection Time: 02/22/14  8:55 PM  Result Value Ref Range Status   Specimen Description BLOOD RIGHT HAND  Final   Special Requests BOTTLES DRAWN AEROBIC ONLY 5CC  Final   Culture   Final           BLOOD CULTURE RECEIVED NO GROWTH TO DATE CULTURE WILL BE HELD FOR 5 DAYS BEFORE ISSUING A FINAL NEGATIVE REPORT Performed at [facility]    Report Status PENDING  Incomplete  Culture, blood (routine x 2)     Status: None (Preliminary result)   Collection Time: 02/22/14 10:50 PM  Result Value Ref Range Status   Specimen Description BLOOD ARM LEFT  Final   Special Requests BOTTLES DRAWN AEROBIC AND ANAEROBIC 5CC EA  Final   Culture   Final            BLOOD CULTURE RECEIVED NO GROWTH TO DATE CULTURE WILL BE HELD FOR 5 DAYS BEFORE ISSUING A FINAL NEGATIVE REPORT Performed at [facility]    Report Status PENDING  Incomplete  Wound culture     Status: None   Collection Time: 02/22/14 11:31 PM  Result Value Ref Range Status   Specimen Description WOUND LEFT HAND  Final   Special Requests NONE  Final   Gram Stain   Final    NO WBC SEEN NO ORGANISMS SEEN Performed at [facility]    Culture   Final    MODERATE METHICILLIN RESISTANT STAPHYLOCOCCUS AUREUS Note: RIFAMPIN AND GENTAMICIN SHOULD NOT BE USED AS SINGLE DRUGS FOR TREATMENT OF STAPH INFECTIONS. This organism DOES NOT demonstrate inducible Clindamycin resistance in vitro. CRITICAL RESULT CALLED TO, READ BACK BY AND VERIFIED WITH: CNSIAN P @ 1058  ON 02/25/14 BY COOKA Performed at [facility]    Report Status 02/25/2014 FINAL  Final   Organism ID, Bacteria METHICILLIN RESISTANT STAPHYLOCOCCUS AUREUS  Final      Susceptibility   Methicillin resistant staphylococcus aureus - MIC*    CLINDAMYCIN <=0.25 SENSITIVE Sensitive     ERYTHROMYCIN >=8 RESISTANT Resistant     GENTAMICIN <=0.5 SENSITIVE Sensitive     LEVOFLOXACIN 0.25 SENSITIVE Sensitive     OXACILLIN >=4 RESISTANT Resistant     PENICILLIN >=0.5 RESISTANT Resistant     RIFAMPIN <=0.5 SENSITIVE Sensitive     TRIMETH/SULFA <=10 SENSITIVE Sensitive     VANCOMYCIN 1 SENSITIVE Sensitive     TETRACYCLINE <=1 SENSITIVE Sensitive     * MODERATE METHICILLIN RESISTANT STAPHYLOCOCCUS AUREUS  MRSA PCR Screening     Status: Abnormal   Collection Time: 02/23/14 12:02 AM  Result Value Ref Range Status   MRSA by PCR POSITIVE (A) NEGATIVE Final    Comment:        The GeneXpert MRSA Assay (FDA approved for NASAL specimens only), is one component of a comprehensive MRSA colonization surveillance program. It is not intended to diagnose MRSA infection nor to guide or monitor treatment for MRSA  infections. RESULT CALLED TO, READ BACK BY AND VERIFIED WITH: BASS,S RN 0304 02/23/14 MITCHELL,L    Studies/Results: Ct Cervical Spine Wo Contrast  02/25/2014   ADDENDUM REPORT: 02/25/2014 19:55  ADDENDUM:  Study discussed by telephone with Dr. Sherley Bounds on 02/25/2014 at 19:54 .  We discussed the nature of the patient's anatomy (with multi level congenital and acquired cervical fusion), and the suspicious findings up acute injury at C4-C5.  We discussed that it is possible that the patient sustained both acute anterior ligamentous and left facet injury at C4-C5 without disrupting the middle column.   Electronically Signed   By: Lars Pinks M.D.   On: 02/25/2014 19:55   02/25/2014   CLINICAL DATA:  81 year old male with cervical spinal stenosis, progressive weakness after a fall, MRI suspicion of cervical cord contusion. Initial encounter.  EXAM: CT CERVICAL SPINE WITHOUT CONTRAST  TECHNIQUE: Multidetector CT imaging of the cervical spine was performed without intravenous contrast. Multiplanar CT image reconstructions were also generated.  COMPARISON:  Cervical spine MRI 1346 hr the same day.  FINDINGS: Visualized skull base is intact. No atlanto-occipital dissociation. Partially calcified bulky ligamentous hypertrophy about the odontoid.  Congenital incomplete segmentation of the C2-C3, and possibly also the and C3-C4, level(s). However, there is bulky circumferential endplate osteophytosis (eccentric to the left) at C3-C4 as well as left facet hypertrophy.  Subsequent moderate to severe disc, endplate, and facet degeneration at C4-C5.  Bulky endplate osteophytosis eccentric to the right then noted from the C5 to the C7 level, with interbody ankylosis at C6-C7. Severe disc space loss with some vacuum disc phenomena at these moderate to severe left side levels. C7-T1 facet hypertrophy, as well as vacuum disc and circumferential disc osteophyte complex at that level.  Bilateral posterior element alignment is  within normal limits. No acute cervical spine fracture identified. Visible upper thoracic levels appear grossly intact.  Multilevel multifactorial spinal and foraminal stenosis result, as detailed by MRI earlier today.  Retropharyngeal course of the right greater than left carotid arteries. There is also a small volume of prevertebral fluid evident at the C4-C5 level, where left side facet joint fluid was visible earlier today. No definite C4-C5 facet or vertebral fracture is identified.  Upper lobe pulmonary emphysema partially visible.  IMPRESSION: 1. Constellation of imaging findings raise the possibility of acute C4-C5 level ligamentous injury with associated left facet diastases. No associated fracture is identified. However, this could be an unstable injury. 2. Severe cervical spine degeneration with underlying ankylosis from the C2 to the C4 level (in part due to congenital incomplete segmentation), and also the C6-C7 level. 3. Associated multilevel severe cervical spinal and foraminal stenosis as detailed on the MRI earlier today. 4. Note also right greater than left retropharyngeal course of the carotid arteries.  Electronically Signed: By: Lars Pinks M.D. On: 02/25/2014 19:31   Mr Cervical Spine Wo Contrast  02/25/2014   CLINICAL DATA:  [place] out of bed on 02/22/2014. Progressive weakness.  EXAM: MRI CERVICAL SPINE WITHOUT CONTRAST  TECHNIQUE: Multiplanar, multisequence MR imaging of the cervical spine was performed. No intravenous contrast was administered.  COMPARISON:  None.  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 Severe degenerative cervical spondylosis with severe multilevel disc disease and facet disease. Moderate endplate reactive changes noted throughout the cervical spine but no definite acute cervical spine fracture. The facets are normally aligned. No facet fractures. No abnormal STIR signal intensity in the posterior elements or paraspinal muscles.  There is significant  compression of the cervical spinal cord at C4-5 with cord thinning and myelomalacia. Just above this level, at C3-4, there is a focal area of increased cord signal intensity which is worrisome for a cord  contusion. The remainder of the cervical cord is unremarkable. There is moderate pannus formation at C1-2 with mild mass effect on the upper cervical cord.  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 No spinal or foraminal stenosis.  C3-4: Severe degenerative disc disease with partial interbody fusion. There is a bulging annulus, osteophytic ridging and uncinate spurring with flattening of the ventral thecal sac and narrowing of the ventral CSF space. There is also a left foraminal stenosis.  C4-5: Broad-based disc protrusion, osteophytic ridging and uncinate spurring with significant mass effect on the ventral thecal sac and bilateral foraminal stenosis, left greater than right.  C5-6: Broad-based right paracentral disc protrusion, osteophytic ridging and uncinate spurring. There is mass effect on the thecal sac asymmetric right and right greater than left foraminal stenosis.  C6-7: Diffuse bulging annulus, osteophytic ridging and uncinate spurring with mild spinal and mild to moderate bilateral foraminal stenosis.  C7-T1: Shallow disc osteophyte complexes bilaterally with bilateral foraminal stenosis.  Additional findings include thyroid goiter with a large right-sided thyroid cyst. An endotracheal tube is noted with fluid in the pharynx.  IMPRESSION: 1. Probable chronic cervical cord myelomalacia at C4-5 with fairly tight multifactorial spinal stenosis. 2. Suspect acute cord contusion at C3-4. 3. 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 4. Probable auto interbody fusion at C3-4. 5. Multilevel spinal and foraminal stenosis as discussed above at the individual levels. These results will be called to the ordering clinician or representative by the Radiologist Assistant, and  communication documented in the PACS or zVision Dashboard.   Electronically Signed   By: Kalman Jewels M.D.   On: 02/25/2014 14:56   Medications: I have reviewed the patient's current medications. Scheduled Meds: . aspirin EC  81 mg Oral Daily  . calcitRIOL  0.25 mcg Oral Q M,W,F  . Chlorhexidine Gluconate Cloth  6 each Topical Q0600  . docusate sodium  100 mg Oral QHS  . doxycycline  100 mg Oral Q12H  . feeding supplement (GLUCERNA SHAKE)  237 mL Oral BID  . fluocinonide ointment  1 application Topical BID  . heparin  5,000 Units Subcutaneous 3 times per day  . multivitamin with minerals  1 tablet Oral Daily  . mupirocin ointment  1 application Nasal BID  . polycarbophil  1,250 mg Oral Daily  . terazosin  1 mg Oral QHS  . tiotropium  18 mcg Inhalation Daily   Continuous Infusions: . sodium chloride 20 mL/hr at 02/25/14 1300   PRN Meds:.albuterol, fentaNYL, HYDROcodone-acetaminophen, LORazepam, meperidine (DEMEROL) injection, promethazine Assessment/Plan: Principal Problem:   Weakness Active Problems:   Acute renal failure   Chronic kidney disease (CKD), stage IV (severe)   COPD (chronic obstructive pulmonary disease)   Thrombocytopenia   Anemia   Hand lesion   Hypertension   Malnutrition of moderate degree   Cervical stenosis of spinal canal  #Weakness secondary to cervical spine stenosis: Cervical MRI with anesthesia remarkable for probable cervical cord myelomalacia at C4-C5 with tight multifactorial spinal stenosis and possible acute contusion at C3-C4. CT c spine yesterday reveals possible acute anterior ligamentous and left facet injury w/o disrupting the middle column.  - CT scan of c spine reveals potential fracture at C4-C5, on  aspen c-collar -Neurosurgery saw patient, recommend cervical decompression and stablization. - the neurosx and internal medicine service have both refused transfer of pt. Neurosx states that there is no indication for emergent sx for cord  contusion.  - Dr. Ronnald Ramp recommends rehab while pt awaits outpatient cervical  surgery - CIR does not have any beds available over the weekend and on Monday there are only 2 beds available both of which have been guaranteed for other patients.  - weekend social worker contact regarding SNF who will start the work up today with possible placement over the weekend or Monday.   #DM 2 w/Peripheral Neuropathy: hemoglobin a1c 5.5 on 1/26 -cont home norco 5-325 q6hprn  #L hand pustule with cellulitis: Mr Earnest has the pustule on his L 3rd metacarpal-phalangeal joint which expressed scant serosanguinous fluid on pressure and surrounding erythema. No signs of systemic infection with 0/4 SIRS criteria.  -wound culture growing MRSA being tx appropriately w/ doxy 100mg  BID, today is day 5 of 10 days tx.  -BCx x 2 NGTD  Oral tongue lesion-- possibly aphthous ulcer.  - viscous lidocaine q6h prn - will have pt f/u with oral surgery outpatient  #CKD:  Patient had stabilized around a creatinine of 4.2.  -can have pt follow up with outpatient nephrology   #Anemia:  hemoglobin of 9.0 from 9.3. Patient has continued to have a down trend during this admission. -will continue to monitor -LDH 185 WNL -anemia panel consistent w/ anemia of chronic disease -fecal occult blood card pending, spoke w/ nurse to collect this. Last BM on 1/27  #CHF: Likely non-contributory as he has no signs of fluid overload on exam. Last echo 07/2012 notable for LVEF 65-46%, grade 1 diastolic dysfunction, increased relative contribution of atrial contraction to ventricular filling, moderate tricuspid regurgitation. At home he takes ASA 81 mg daily, metoprolol 50 mg daily.  -holding lopressor, currently normotensive -continue ASA 81 mg daily  #COPD: At home he uses 2.5 L O2 at nights, spiriva 18 mcg inh daily, and albuterol 2 puff inh q6hprn. Currently on 2L O2 [place] -continue spiriva 18 mcg inh daily, albuterol 2 puff q6hprn  #HTN:   At home he uses ASA 81 mg daily, metoprolol 50 mg daily. Patient remains mostly normotensive. -normotensive, holding lopressor -continue ASA 81 mg daily  #Thrombocytopenia: Presenting platelet 110. On previous admission in 07/2012, his platelets ranged from 94-199 and was previously attributed to his critical illness but cause is unclear as it is persistent. He does not appear to have liver disease as AST and ALT wnl. Platelets today 164. Will cont to monitor.   #Psoriasis: Mr Mees has plaques on his b/l knees and R elbow. At home he uses flucinonide ointment bid -cont flucinonide ointment bid  #Prostate cancer s/p prostatectomy: Mr Mccullers's family said he had a prostatectomy about ten years ago for prostate cancer. When asked if there were any issues since, they said he has a brain mass that the doctors are not concerned about. However, no brain mass was noted on head CT wo contrast. At home he is on terazosin 1 mg qhs -MRI/MRA head brain wo contrast completed, notes a rt frontal lobe remote infarct w/ encephalomalacia that does not represent a mass since there has not been any changes since 2014 -cont terazosin 1 mg qhs - PSA elevated at 5.54, will need to compare with previous PSA values.  #Diet: carb modified  #DVT PPx: heparin 5000 u Anthony tid  #Code: full  Dispo: awaiting SNF as [facility] has refused transfer  The patient does have a current PCP in the [facility] and does need an [facility] hospital follow-up appointment after discharge.  The patient does not know have transportation limitations that hinder transportation to clinic appointments. .Services Needed at time of  discharge: Y = Yes, Blank = No PT:   OT:   RN:   Equipment:   Other:     LOS: 5 days   Julious Oka, MD 02/27/2014, 12:34 PM

## 2014-02-27 NOTE — Progress Notes (Signed)
Patient ID: Brad Jones, male   DOB: [birth year], 81 y.o.   MRN: [id number] BP 109/70 mmHg  Pulse 69  Temp(Src) 98.5 F (36.9 C) (Oral)  Resp 18  Ht 5\' 9"  (1.753 m)  Wt 75.6 kg (166 lb 10.7 oz)  BMI 24.60 kg/m2  SpO2 99% Alert and oriented x 4, speech is clear and fluent Weak in left upper extremity Moving all extremities No change in exam [facility]

## 2014-02-27 NOTE — Progress Notes (Addendum)
I received call from Attending service requesting an inpt rehab admission. Stated surgery pending at a later date either as an oupt at the [place] or at [facility]. Pt has not been accepted at the [facility] for transfer, for surgery not imminent. Inpt rehab beds limited at this time and I can not guarantee an inpt rehab bed available at the beginning of the week. I recommend SW to assist for SNF placement and  RN CM to contact area inpt rehab facilities for availability at [facility] or [place] if family agreeable. I will follow up on Monday. Also noted that therapy is requesting activity orders clarification. Please advise. [phone]

## 2014-02-28 LAB — BASIC METABOLIC PANEL
Anion gap: 6 (ref 5–15)
BUN: 54 mg/dL — ABNORMAL HIGH (ref 6–23)
CALCIUM: 8.9 mg/dL (ref 8.4–10.5)
CO2: 25 mmol/L (ref 19–32)
Chloride: 108 mmol/L (ref 96–112)
Creatinine, Ser: 3.91 mg/dL — ABNORMAL HIGH (ref 0.50–1.35)
GFR calc Af Amer: 15 mL/min — ABNORMAL LOW (ref >90)
GFR calc non Af Amer: 13 mL/min — ABNORMAL LOW (ref >90)
Glucose, Bld: 86 mg/dL (ref 70–99)
Potassium: 5.4 mmol/L — ABNORMAL HIGH (ref 3.5–5.1)
Sodium: 139 mmol/L (ref 135–145)

## 2014-02-28 LAB — CBC
HCT: 29.9 % — ABNORMAL LOW (ref 39.0–52.0)
Hemoglobin: 9 g/dL — ABNORMAL LOW (ref 13.0–17.0)
MCH: 24.7 pg — ABNORMAL LOW (ref 26.0–34.0)
MCHC: 30.1 g/dL (ref 30.0–36.0)
MCV: 81.9 fL (ref 78.0–100.0)
PLATELETS: 115 10*3/uL — AB (ref 150–400)
RBC: 3.65 MIL/uL — ABNORMAL LOW (ref 4.22–5.81)
RDW: 15.2 % (ref 11.5–15.5)
WBC: 7.4 10*3/uL (ref 4.0–10.5)

## 2014-02-28 LAB — GLUCOSE, CAPILLARY
GLUCOSE-CAPILLARY: 116 mg/dL — AB (ref 70–99)
GLUCOSE-CAPILLARY: 81 mg/dL (ref 70–99)
GLUCOSE-CAPILLARY: 100 mg/dL — AB (ref 70–99)
Glucose-Capillary: 100 mg/dL — ABNORMAL HIGH (ref 70–99)
Glucose-Capillary: 107 mg/dL — ABNORMAL HIGH (ref 70–99)

## 2014-02-28 LAB — CK: Total CK: 66 U/L (ref 7–232)

## 2014-02-28 LAB — TECHNOLOGIST SMEAR REVIEW

## 2014-02-28 NOTE — Social Work (Signed)
Clinical Social Work Department BRIEF PSYCHOSOCIAL ASSESSMENT 02/28/2014  Patient:  Brad Jones, Brad Jones     Account Number:  [id number]     Admit date:  02/22/2014  Clinical Social Worker:  Lenord Fellers  Date/Time:  02/28/2014 02:29 PM  Referred by:  Physician  Date Referred:  02/27/2014 Referred for  SNF Placement   Other Referral:   Interview type:  Patient Other interview type:    PSYCHOSOCIAL DATA Living Status:  WIFE Admitted from facility:   Level of care:   Primary support name:  Carolyn [place] Primary support relationship to patient:  CHILD, ADULT Degree of support available:   Patient currently lives with wife and daughters live nearby and offer support.    CURRENT CONCERNS Current Concerns  Post-Acute Placement   Other Concerns:    SOCIAL WORK ASSESSMENT / PLAN Patient will need to have surgery from the [place]. The surgery cannot be done at [facility] because of patient's insurance. CSW shared with family that transition for treatment will take place as soon as possible.   Assessment/plan status:  Information/Referral to [employer] Other assessment/ plan:   Information/referral to community resources:    PATIENT'S/FAMILY'S RESPONSE TO PLAN OF CARE: Family agreeable to placement in [place] or [place].    Christene Lye MSW, [place]

## 2014-02-28 NOTE — Progress Notes (Signed)
No issues overnight. Pt has no new complaints.  EXAM:  BP 100/56 mmHg  Pulse 75  Temp(Src) 98.5 F (36.9 C) (Oral)  Resp 20  Ht 5\' 9"  (1.753 m)  Wt 75.6 kg (166 lb 10.7 oz)  BMI 24.60 kg/m2  SpO2 100%  Awake, alert, oriented  Speech fluent, appropriate  Good strength RUE/RLE. 3-4/5 LUE, 4/5 LLE   IMPRESSION:  81 y.o. male with cervical stenosis with myelopathy, stable  PLAN: - Placement in [facility] v SNF, may require decompression as an outpatient.

## 2014-02-28 NOTE — Progress Notes (Signed)
Subjective:  Pt seen and examined in AM. No acute events overnight. He reports his mouth pain has improved as well as his left pustular cellulitis. He has no increased weakness or paraesthesias from baseline.    Objective: Vital signs in last 24 hours: Filed Vitals:   02/28/14 0157 02/28/14 0600 02/28/14 0850 02/28/14 1014  BP: 110/57 112/62  100/56  Pulse: 77 78  75  Temp: 98.9 F (37.2 C) 98.5 F (36.9 C)  98.5 F (36.9 C)  TempSrc: Oral Oral  Oral  Resp: 20 20  20   Height:      Weight:      SpO2: 100% 98% 100% 100%   Weight change:   Intake/Output Summary (Last 24 hours) at 02/28/14 1032 Last data filed at 02/27/14 1935  Gross per 24 hour  Intake    480 ml  Output    450 ml  Net     30 ml   PHYSICAL EXAMINATION:  General: alert, well-developed, and cooperative to examination.  Mouth: No visible ulceration   Neck: c-collar in aplce Lungs: Clear to ausculation anteriorly  Heart: normal rate, regular rhythm, no murmur Abdomen: soft, non-tender, normal bowel sounds, no distention, no guarding, no rebound tenderness Ext: No pitting edema, bilateral lower extremity hyperpigmented plaques with overlying scale. Left hand pustular cellulitis healing well.  Neurologic: alert & oriented X3. 4/5 left UE & LE strength, otherwise 5/5. Sensation intact to light touch/ Psych: Oriented X3  Lab Results: Basic Metabolic Panel:  Recent Labs Lab 02/24/14 0456 02/28/14 0335  NA 145 139  K 4.6 5.4*  CL 112 108  CO2 25 25  GLUCOSE 108* 86  BUN 35* 54*  CREATININE 4.27* 3.91*  CALCIUM 9.0 8.9   Liver Function Tests:  Recent Labs Lab 02/22/14 1355  AST 31  ALT 11  ALKPHOS 55  BILITOT 0.6  PROT 5.7*  ALBUMIN 2.9*  CBC:  Recent Labs Lab 02/24/14 0456  02/27/14 1839 02/28/14 0335  WBC 7.2  < > 6.8 7.4  NEUTROABS 5.3  --  4.8  --   HGB 9.3*  < > 9.0* 9.0*  HCT 31.1*  < > 29.8* 29.9*  MCV 82.3  < > 82.3 81.9  PLT 151  < > 171 115*  < > = values in this  interval not displayed. Cardiac Enzymes:  Recent Labs Lab 02/22/14 1355 02/24/14 1030 02/28/14 0335  CKTOTAL  --  297* 66  CKMB  --  2.9  --   TROPONINI <0.03  --   --    CBG:  Recent Labs Lab 02/26/14 2140 02/27/14 0644 02/27/14 1053 02/27/14 1629 02/27/14 2148 02/28/14 0616  GLUCAP 102* 89 114* 106* 116* 81   Hemoglobin A1C:  Recent Labs Lab 02/23/14 0735  HGBA1C 5.5   Fasting Lipid Panel:  Recent Labs Lab 02/23/14 0735  CHOL 151  HDL 46  LDLCALC 88  TRIG 85  CHOLHDL 3.3   Thyroid Function Tests:  Recent Labs Lab 02/23/14 0735  TSH 1.321   Coagulation: No results for input(s): LABPROT, INR in the last 168 hours. Anemia Panel:  Recent Labs Lab 02/22/14 2250 02/23/14 0014  VITAMINB12  --  400  FOLATE  --  8.5  FERRITIN  --  115  TIBC  --  170*  IRON  --  32*  RETICCTPCT 1.0  --    Urinalysis:  Recent Labs Lab 02/22/14 1349  COLORURINE YELLOW  LABSPEC 1.017  PHURINE 5.0  GLUCOSEU NEGATIVE  HGBUR MODERATE*  BILIRUBINUR NEGATIVE  KETONESUR NEGATIVE  PROTEINUR 30*  UROBILINOGEN 0.2  NITRITE NEGATIVE  LEUKOCYTESUR NEGATIVE     Micro Results: Recent Results (from the past 240 hour(s))  Urine culture     Status: None   Collection Time: 02/22/14  1:49 PM  Result Value Ref Range Status   Specimen Description URINE, CLEAN CATCH  Final   Special Requests NONE  Final   Colony Count   Final    30,000 COLONIES/ML Performed at [facility]    Culture   Final    Multiple bacterial morphotypes present, none predominant. Suggest appropriate recollection if clinically indicated. Performed at [facility]    Report Status 02/23/2014 FINAL  Final  Culture, blood (routine x 2)     Status: None (Preliminary result)   Collection Time: 02/22/14  8:55 PM  Result Value Ref Range Status   Specimen Description BLOOD RIGHT HAND  Final   Special Requests BOTTLES DRAWN AEROBIC ONLY 5CC  Final   Culture   Final           BLOOD  CULTURE RECEIVED NO GROWTH TO DATE CULTURE WILL BE HELD FOR 5 DAYS BEFORE ISSUING A FINAL NEGATIVE REPORT Performed at [facility]    Report Status PENDING  Incomplete  Culture, blood (routine x 2)     Status: None (Preliminary result)   Collection Time: 02/22/14 10:50 PM  Result Value Ref Range Status   Specimen Description BLOOD ARM LEFT  Final   Special Requests BOTTLES DRAWN AEROBIC AND ANAEROBIC 5CC EA  Final   Culture   Final           BLOOD CULTURE RECEIVED NO GROWTH TO DATE CULTURE WILL BE HELD FOR 5 DAYS BEFORE ISSUING A FINAL NEGATIVE REPORT Performed at [facility]    Report Status PENDING  Incomplete  Wound culture     Status: None   Collection Time: 02/22/14 11:31 PM  Result Value Ref Range Status   Specimen Description WOUND LEFT HAND  Final   Special Requests NONE  Final   Gram Stain   Final    NO WBC SEEN NO ORGANISMS SEEN Performed at [facility]    Culture   Final    MODERATE METHICILLIN RESISTANT STAPHYLOCOCCUS AUREUS Note: RIFAMPIN AND GENTAMICIN SHOULD NOT BE USED AS SINGLE DRUGS FOR TREATMENT OF STAPH INFECTIONS. This organism DOES NOT demonstrate inducible Clindamycin resistance in vitro. CRITICAL RESULT CALLED TO, READ BACK BY AND VERIFIED WITH: CNSIAN P @ 1058  ON 02/25/14 BY COOKA Performed at [facility]    Report Status 02/25/2014 FINAL  Final   Organism ID, Bacteria METHICILLIN RESISTANT STAPHYLOCOCCUS AUREUS  Final      Susceptibility   Methicillin resistant staphylococcus aureus - MIC*    CLINDAMYCIN <=0.25 SENSITIVE Sensitive     ERYTHROMYCIN >=8 RESISTANT Resistant     GENTAMICIN <=0.5 SENSITIVE Sensitive     LEVOFLOXACIN 0.25 SENSITIVE Sensitive     OXACILLIN >=4 RESISTANT Resistant     PENICILLIN >=0.5 RESISTANT Resistant     RIFAMPIN <=0.5 SENSITIVE Sensitive     TRIMETH/SULFA <=10 SENSITIVE Sensitive     VANCOMYCIN 1 SENSITIVE Sensitive     TETRACYCLINE <=1 SENSITIVE Sensitive     * MODERATE  METHICILLIN RESISTANT STAPHYLOCOCCUS AUREUS  MRSA PCR Screening     Status: Abnormal   Collection Time: 02/23/14 12:02 AM  Result Value Ref Range Status   MRSA by PCR POSITIVE (A) NEGATIVE  Final    Comment:        The GeneXpert MRSA Assay (FDA approved for NASAL specimens only), is one component of a comprehensive MRSA colonization surveillance program. It is not intended to diagnose MRSA infection nor to guide or monitor treatment for MRSA infections. RESULT CALLED TO, READ BACK BY AND VERIFIED WITH: BASS,S RN 0304 02/23/14 MITCHELL,L    Studies/Results: No results found. Medications: I have reviewed the patient's current medications. Scheduled Meds: . aspirin EC  81 mg Oral Daily  . calcitRIOL  0.25 mcg Oral Q M,W,F  . docusate sodium  100 mg Oral QHS  . doxycycline  100 mg Oral Q12H  . feeding supplement (GLUCERNA SHAKE)  237 mL Oral BID  . fluocinonide ointment  1 application Topical BID  . heparin  5,000 Units Subcutaneous 3 times per day  . multivitamin with minerals  1 tablet Oral Daily  . polycarbophil  1,250 mg Oral Daily  . terazosin  1 mg Oral QHS  . tiotropium  18 mcg Inhalation Daily   Continuous Infusions: . sodium chloride 20 mL/hr at 02/25/14 1300   PRN Meds:.albuterol, fentaNYL, HYDROcodone-acetaminophen, lidocaine, LORazepam, meperidine (DEMEROL) injection, promethazine Assessment/Plan: Principal Problem:   Weakness Active Problems:   Acute renal failure   Chronic kidney disease (CKD), stage IV (severe)   COPD (chronic obstructive pulmonary disease)   Thrombocytopenia   Anemia   Hand lesion   Hypertension   Malnutrition of moderate degree   Cervical stenosis of spinal canal   Cervical Stenosis - No new neurological deficits.   -Continue aspen c-collar  -Touch base with neurosurgeon Dr. Ronnald Ramp tomm regarding timing of cervical decompression -Awaiting SNF placement  Hyperkalemia - K 5.4. Cr improved from 4.27 to 3.91.  -Repeat BMP  -Obtain  EKG and treat if continues to be elevated   Left hand pustular cellulitis - Healing well.  -Continue doxycyline 100 mg BID Day 6/7  Mouth Ulcer - Improved pain.  -Continue lidocaine Q 6 hr pain  ACD - Hg stable at 9 with no active bleeding.  -Monitor CBC and f/u FOBT  COPD - Stable with no exacerbation -Continue spiriva 18 mcg daily & albuterol 2 puff PRN    Dispo: Disposition is deferred at this time, awaiting SNF placement.   The patient does have a current PCP (Provider Default, MD) and does need an [facility] hospital follow-up appointment after discharge.  The patient does have transportation limitations that hinder transportation to clinic appointments.  .Services Needed at time of discharge: Y = Yes, Blank = No PT:   OT:   RN:   Equipment:   Other:     LOS: 6 days   Juluis Mire, MD 02/28/2014, 10:32 AM

## 2014-03-01 DIAGNOSIS — B9689 Other specified bacterial agents as the cause of diseases classified elsewhere: Secondary | ICD-10-CM

## 2014-03-01 DIAGNOSIS — R29898 Other symptoms and signs involving the musculoskeletal system: Secondary | ICD-10-CM | POA: Insufficient documentation

## 2014-03-01 DIAGNOSIS — D638 Anemia in other chronic diseases classified elsewhere: Secondary | ICD-10-CM

## 2014-03-01 DIAGNOSIS — E875 Hyperkalemia: Secondary | ICD-10-CM

## 2014-03-01 DIAGNOSIS — K121 Other forms of stomatitis: Secondary | ICD-10-CM

## 2014-03-01 LAB — BASIC METABOLIC PANEL
Anion gap: 6 (ref 5–15)
BUN: 61 mg/dL — ABNORMAL HIGH (ref 6–23)
CALCIUM: 9.4 mg/dL (ref 8.4–10.5)
CHLORIDE: 108 mmol/L (ref 96–112)
CO2: 26 mmol/L (ref 19–32)
Creatinine, Ser: 4.04 mg/dL — ABNORMAL HIGH (ref 0.50–1.35)
GFR calc Af Amer: 14 mL/min — ABNORMAL LOW (ref >90)
GFR calc non Af Amer: 12 mL/min — ABNORMAL LOW (ref >90)
GLUCOSE: 92 mg/dL (ref 70–99)
Potassium: 5.5 mmol/L — ABNORMAL HIGH (ref 3.5–5.1)
Sodium: 140 mmol/L (ref 135–145)

## 2014-03-01 LAB — CULTURE, BLOOD (ROUTINE X 2)
Culture: NO GROWTH
Culture: NO GROWTH

## 2014-03-01 LAB — GLUCOSE, CAPILLARY
GLUCOSE-CAPILLARY: 99 mg/dL (ref 70–99)
GLUCOSE-CAPILLARY: 110 mg/dL — AB (ref 70–99)
Glucose-Capillary: 87 mg/dL (ref 70–99)
Glucose-Capillary: 117 mg/dL — ABNORMAL HIGH (ref 70–99)

## 2014-03-01 MED ORDER — SODIUM POLYSTYRENE SULFONATE 15 GM/60ML PO SUSP
15.0000 g | Freq: Once | ORAL | Status: AC
  Administered 2014-03-01: 15 g via ORAL
  Filled 2014-03-01: qty 60

## 2014-03-01 NOTE — Progress Notes (Signed)
Spoke with pt's RN re: refusal of bed at [facility]/d/c cancelled.  Will inform Unit CM for f/u in the am.

## 2014-03-01 NOTE — Discharge Summary (Signed)
Name: Brad Jones MRN: [id number] DOB: 1927-03-27 81 y.o. PCP: Provider Default, MD  Date of Admission: 02/22/2014  1:12 PM Date of Discharge: 03/02/2014 Attending Physician: Karren Cobble, MD  Discharge Diagnosis:   Weakness due to cervical spinal stenosis and questionable acute cord contusion at C3-C4   Left hand cellulitis   Chronic kidney disease (CKD), stage IV (severe)   COPD (chronic obstructive pulmonary disease)   Thrombocytopenia   Anemia   Hypertension   Oral tongue lesion    Discharge Medications:   Medication List    TAKE these medications        albuterol 108 (90 BASE) MCG/ACT inhaler  Commonly known as:  PROVENTIL HFA;VENTOLIN HFA  Inhale 2 puffs into the lungs every 6 (six) hours as needed for wheezing.     aspirin 81 MG tablet  Take 81 mg by mouth daily.     calcitRIOL 0.25 MCG capsule  Commonly known as:  ROCALTROL  Take 0.25 mcg by mouth every Monday, Wednesday, and Friday.     docusate sodium 50 MG capsule  Commonly known as:  COLACE  Take by mouth at bedtime.     fluocinonide ointment 0.05 %  Commonly known as:  LIDEX  Apply 1 application topically 2 (two) times daily.     HYDROcodone-acetaminophen 5-325 MG per tablet  Commonly known as:  NORCO/VICODIN  Take 1 tablet by mouth every 6 (six) hours as needed for pain.     metoprolol succinate 100 MG 24 hr tablet  Commonly known as:  TOPROL-XL  Take 50 mg by mouth daily. Take with or immediately following a meal.     nitroGLYCERIN 0.4 MG SL tablet  Commonly known as:  NITROSTAT  Place 0.4 mg under the tongue every 5 (five) minutes as needed for chest pain.     polycarbophil 625 MG tablet  Commonly known as:  FIBERCON  Take 2 tablets (1,250 mg total) by mouth daily. Over the counter to help bulk stools.     terazosin 1 MG capsule  Commonly known as:  HYTRIN  Take 1 mg by mouth at bedtime.     tiotropium 18 MCG inhalation capsule  Commonly known as:  SPIRIVA  Place 18 mcg into inhaler  and inhale daily.        Disposition and follow-up:   [name] was discharged from [facility] in stable condition.  At the hospital follow up visit please address:  1. Please ensure patient has follow up with Neurosurgery at [facility] for his cervical stenosis. His discharge summary will be faxed from [facility] to his PCP at the [facility] so he can have a referral placed to neurosurgery.  Please ensure patient has appropriate f/u with [facility] nephrologist and oral surgeon for oral lesion on left side of tongue.   Hyperkalemia- please check repeat bmet on 03/03/14. Recommend Kayexalate 15 g daily.   2.  Labs / imaging needed at time of follow-up: bmet- check K  3.  Pending labs/ test needing follow-up: none  Follow-up Appointments:   Discharge Instructions    Diet - low sodium heart healthy    Complete by:  As directed      Diet - low sodium heart healthy    Complete by:  As directed      Increase activity slowly    Complete by:  As directed      Increase activity slowly    Complete by:  As directed  Consultations: Treatment Team:  Eustace Moore, MD  Procedures Performed:  Dg Chest 2 View  02/22/2014   CLINICAL DATA:  Short of breath.  Initial encounter.  EXAM: CHEST  2 VIEW  COMPARISON:  08/15/2012.  FINDINGS: The cardiopericardial silhouette is within normal limits for size. Tortuous thoracic aorta with arch atherosclerosis. Pulmonary parenchymal scarring is present, most prominent at the bases. There is no airspace disease. No pleural effusion is identified. Suboptimal lateral view because the patient was unable to raise arms overhead. Partially translucent monitoring buttons are projected over the chest.  IMPRESSION: No acute cardiopulmonary disease.   Electronically Signed   By: Dereck Ligas M.D.   On: 02/22/2014 15:51   Ct Head Wo Contrast  02/22/2014   CLINICAL DATA:  Status post fall 02/20/2014 due to left-side weakness.  EXAM: CT HEAD  WITHOUT CONTRAST  TECHNIQUE: Contiguous axial images were obtained from the base of the skull through the vertex without intravenous contrast.  COMPARISON:  Head CT scan 08/09/2012.  FINDINGS: Atrophy and chronic microvascular ischemic change are identified. No evidence of acute intracranial abnormality including hemorrhage, infarct, mass lesion, mass effect, midline shift or abnormal extra-axial fluid collection is identified. There is no hydrocephalus or pneumocephalus. The calvarium is intact.  IMPRESSION: No acute abnormality.  Atrophy and chronic microvascular ischemic change.   Electronically Signed   By: Inge Rise M.D.   On: 02/22/2014 14:34   Ct Cervical Spine Wo Contrast  02/25/2014   ADDENDUM REPORT: 02/25/2014 19:55  ADDENDUM: Study discussed by telephone with Dr. Sherley Bounds on 02/25/2014 at 19:54 .  We discussed the nature of the patient's anatomy (with multi level congenital and acquired cervical fusion), and the suspicious findings up acute injury at C4-C5.  We discussed that it is possible that the patient sustained both acute anterior ligamentous and left facet injury at C4-C5 without disrupting the middle column.   Electronically Signed   By: Lars Pinks M.D.   On: 02/25/2014 19:55   02/25/2014   CLINICAL DATA:  81 year old male with cervical spinal stenosis, progressive weakness after a fall, MRI suspicion of cervical cord contusion. Initial encounter.  EXAM: CT CERVICAL SPINE WITHOUT CONTRAST  TECHNIQUE: Multidetector CT imaging of the cervical spine was performed without intravenous contrast. Multiplanar CT image reconstructions were also generated.  COMPARISON:  Cervical spine MRI 1346 hr the same day.  FINDINGS: Visualized skull base is intact. No atlanto-occipital dissociation. Partially calcified bulky ligamentous hypertrophy about the odontoid.  Congenital incomplete segmentation of the C2-C3, and possibly also the and C3-C4, level(s). However, there is bulky circumferential  endplate osteophytosis (eccentric to the left) at C3-C4 as well as left facet hypertrophy.  Subsequent moderate to severe disc, endplate, and facet degeneration at C4-C5.  Bulky endplate osteophytosis eccentric to the right then noted from the C5 to the C7 level, with interbody ankylosis at C6-C7. Severe disc space loss with some vacuum disc phenomena at these moderate to severe left side levels. C7-T1 facet hypertrophy, as well as vacuum disc and circumferential disc osteophyte complex at that level.  Bilateral posterior element alignment is within normal limits. No acute cervical spine fracture identified. Visible upper thoracic levels appear grossly intact.  Multilevel multifactorial spinal and foraminal stenosis result, as detailed by MRI earlier today.  Retropharyngeal course of the right greater than left carotid arteries. There is also a small volume of prevertebral fluid evident at the C4-C5 level, where left side facet joint fluid was visible earlier today. No definite C4-C5  facet or vertebral fracture is identified.  Upper lobe pulmonary emphysema partially visible.  IMPRESSION: 1. Constellation of imaging findings raise the possibility of acute C4-C5 level ligamentous injury with associated left facet diastases. No associated fracture is identified. However, this could be an unstable injury. 2. Severe cervical spine degeneration with underlying ankylosis from the C2 to the C4 level (in part due to congenital incomplete segmentation), and also the C6-C7 level. 3. Associated multilevel severe cervical spinal and foraminal stenosis as detailed on the MRI earlier today. 4. Note also right greater than left retropharyngeal course of the carotid arteries.  Electronically Signed: By: Lars Pinks M.D. On: 02/25/2014 19:31   Mr Brain Wo Contrast  02/23/2014   CLINICAL DATA:  81 year old diabetic hypertensive male with multiple TIAs with residual left-sided weakness. The patient states worsening left-sided  weakness involving arm and leg for the past 2 weeks. Unwitnessed fall 02/19/2014.  EXAM: MRI HEAD WITHOUT CONTRAST  MRA HEAD WITHOUT CONTRAST  TECHNIQUE: Multiplanar, multiecho pulse sequences of the brain and surrounding structures were obtained without intravenous contrast. Angiographic images of the head were obtained using MRA technique without contrast.  COMPARISON:  02/22/2014 and 08/09/2012 CT.  No comparison MR.  FINDINGS: MRI HEAD FINDINGS  Exam is motion degraded.  No acute infarct.  No intracranial hemorrhage.  Prominent small vessel disease type changes. In the right frontal lobe, findings suggestive of remote infarct with encephalomalacia. The lack of change since 2014 suggests that this does not represent an underlying mass.  Global atrophy. Ventricular prominence felt to be related to atrophy rather than hydrocephalus.  Transverse ligament hypertrophy with mild compression of the upper cervical spine. Cervical spondylotic changes C2-3 with spinal stenosis and mild cord flattening.  Exophthalmos.  Major intracranial vascular structures are patent. Ectatic basilar artery impresses upon and superiorly displaces the hypothalamus.  MRA HEAD FINDINGS  Anterior circulation without medium or large size vessel significant stenosis or occlusion.  Moderate narrowing and irregularity middle cerebral artery branch vessels and A2 segment anterior cerebral artery bilaterally.  Ectatic vertebral arteries and basilar artery. Right vertebral artery is dominant.  Poor delineation of the posterior inferior cerebellar artery, anterior inferior cerebellar artery and superior cerebellar artery beyond proximal aspect bilaterally.  Poor delineation posterior cerebral artery distal branches bilaterally.  No aneurysm noted.  IMPRESSION: MRI HEAD  No acute infarct.  Prominent small vessel disease type changes. In the right frontal lobe, findings suggestive of remote infarct with encephalomalacia.  Global atrophy.  Transverse  ligament hypertrophy with mild compression of the upper cervical spine. Cervical spondylotic changes C2-3 with spinal stenosis and mild cord flattening.  Exophthalmos.  MRA HEAD FINDINGS  Branch vessel intracranial atherosclerotic type changes as noted above.   Electronically Signed   By: Chauncey Cruel M.D.   On: 02/23/2014 06:32   Mr Cervical Spine Wo Contrast  02/25/2014   CLINICAL DATA:  [place] out of bed on 02/22/2014. Progressive weakness.  EXAM: MRI CERVICAL SPINE WITHOUT CONTRAST  TECHNIQUE: Multiplanar, multisequence MR imaging of the cervical spine was performed. No intravenous contrast was administered.  COMPARISON:  None.  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 Severe degenerative cervical spondylosis with severe multilevel disc disease and facet disease. Moderate endplate reactive changes noted throughout the cervical spine but no definite acute cervical spine fracture. The facets are normally aligned. No facet fractures. No abnormal STIR signal intensity in the posterior elements or paraspinal muscles.  There is significant compression of the cervical spinal cord at C4-5  with cord thinning and myelomalacia. Just above this level, at C3-4, there is a focal area of increased cord signal intensity which is worrisome for a cord contusion. The remainder of the cervical cord is unremarkable. There is moderate pannus formation at C1-2 with mild mass effect on the upper cervical cord.  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 No spinal or foraminal stenosis.  C3-4: Severe degenerative disc disease with partial interbody fusion. There is a bulging annulus, osteophytic ridging and uncinate spurring with flattening of the ventral thecal sac and narrowing of the ventral CSF space. There is also a left foraminal stenosis.  C4-5: Broad-based disc protrusion, osteophytic ridging and uncinate spurring with significant mass effect on the ventral thecal sac and bilateral foraminal  stenosis, left greater than right.  C5-6: Broad-based right paracentral disc protrusion, osteophytic ridging and uncinate spurring. There is mass effect on the thecal sac asymmetric right and right greater than left foraminal stenosis.  C6-7: Diffuse bulging annulus, osteophytic ridging and uncinate spurring with mild spinal and mild to moderate bilateral foraminal stenosis.  C7-T1: Shallow disc osteophyte complexes bilaterally with bilateral foraminal stenosis.  Additional findings include thyroid goiter with a large right-sided thyroid cyst. An endotracheal tube is noted with fluid in the pharynx.  IMPRESSION: 1. Probable chronic cervical cord myelomalacia at C4-5 with fairly tight multifactorial spinal stenosis. 2. Suspect acute cord contusion at C3-4. 3. 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 4. Probable auto interbody fusion at C3-4. 5. Multilevel spinal and foraminal stenosis as discussed above at the individual levels. These results will be called to the ordering clinician or representative by the Radiologist Assistant, and communication documented in the PACS or zVision Dashboard.   Electronically Signed   By: Kalman Jewels M.D.   On: 02/25/2014 14:56   Mr Mra Head/brain Wo Cm  02/23/2014   CLINICAL DATA:  81 year old diabetic hypertensive male with multiple TIAs with residual left-sided weakness. The patient states worsening left-sided weakness involving arm and leg for the past 2 weeks. Unwitnessed fall 02/19/2014.  EXAM: MRI HEAD WITHOUT CONTRAST  MRA HEAD WITHOUT CONTRAST  TECHNIQUE: Multiplanar, multiecho pulse sequences of the brain and surrounding structures were obtained without intravenous contrast. Angiographic images of the head were obtained using MRA technique without contrast.  COMPARISON:  02/22/2014 and 08/09/2012 CT.  No comparison MR.  FINDINGS: MRI HEAD FINDINGS  Exam is motion degraded.  No acute infarct.  No intracranial hemorrhage.  Prominent small vessel  disease type changes. In the right frontal lobe, findings suggestive of remote infarct with encephalomalacia. The lack of change since 2014 suggests that this does not represent an underlying mass.  Global atrophy. Ventricular prominence felt to be related to atrophy rather than hydrocephalus.  Transverse ligament hypertrophy with mild compression of the upper cervical spine. Cervical spondylotic changes C2-3 with spinal stenosis and mild cord flattening.  Exophthalmos.  Major intracranial vascular structures are patent. Ectatic basilar artery impresses upon and superiorly displaces the hypothalamus.  MRA HEAD FINDINGS  Anterior circulation without medium or large size vessel significant stenosis or occlusion.  Moderate narrowing and irregularity middle cerebral artery branch vessels and A2 segment anterior cerebral artery bilaterally.  Ectatic vertebral arteries and basilar artery. Right vertebral artery is dominant.  Poor delineation of the posterior inferior cerebellar artery, anterior inferior cerebellar artery and superior cerebellar artery beyond proximal aspect bilaterally.  Poor delineation posterior cerebral artery distal branches bilaterally.  No aneurysm noted.  IMPRESSION: MRI HEAD  No acute infarct.  Prominent small vessel disease type changes. In the right frontal lobe, findings suggestive of remote infarct with encephalomalacia.  Global atrophy.  Transverse ligament hypertrophy with mild compression of the upper cervical spine. Cervical spondylotic changes C2-3 with spinal stenosis and mild cord flattening.  Exophthalmos.  MRA HEAD FINDINGS  Branch vessel intracranial atherosclerotic type changes as noted above.   Electronically Signed   By: Chauncey Cruel M.D.   On: 02/23/2014 06:32    2D Echo: Study Conclusions  - Left ventricle: Systolic function was normal. The estimated ejection fraction was in the range of 55% to 60%.  Impressions:  - Extremely limited due to poor sound wave  transmission; LV function appears to be normal; focal wall motion abnormality cannot be excluded; doppler suboptimal; if clinically indicated, TEE would have greater sensitivity for source of embolus.     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 H says that he has had worsening L sided weakness of his arm and leg for the past two weeks. He had an unwitnessed fall on Friday 1/22. He says he was conscious the entire time without any confusion or bowel or bladder incontinence or any head trauma. He thinks the weakness has been constant over this time period and is "just a hair" better today than two weeks ago. He normally uses a walker to ambulate but has had difficulty ambulating over this time period. He is also concerned that he had felt cold in his hands and feet for the past two months. There is also a numbness which has been present for years. He burnt the base of his left third finger about two weeks ago with a hot pack and since developed a blister that his daughter drained with clear fluid. He also says he has had a frontal headache for the past few days. He denies any chest pain, changes in baseline SOB, palpitations, abdominal pain, nausea, emesis, diarrhea, constipation.  Of note, [facility] Kerney [facility] with limited encounters in our EMR. He was hospitalized in 07/2012 for acute respiratory failure 2/2 CAP with strep pneumo bacteremia requiring intubation and acute on chronic renal failure with discharge to inpatient rehab. Mr Jakubiak and his family want his care transferred to the [facility]. They contacted the [facility] before coming to [facility] and were told there were no beds. They provided the phone number [phone] to coordinate transfer.   Hospital Course by problem list:   Weakness due to cervical spinal stenosis and  questionable acute cord contusion at C3-C4   Left hand cellulitis   Chronic kidney disease (CKD), stage IV (severe)   COPD (chronic obstructive pulmonary disease)   Thrombocytopenia   Anemia   Hypertension    Oral tongue lesion  Weakness-- TIA work up completed and was negative. 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 Neurosurgery saw patient and recommended cervical decompression and stabilization. Dr. Ronnald Ramp discussed w/ radiologist results of CT c spine. Believe that there could be a potential fracture at C4-C5. Aspen C collar ordered for pt on transfer to [place]. CT head and MRI/MRA head negative for any acute infarct. 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 ECHO was limited and could not detect focal wall motion abnormalities. TEE was recommended for better sensitivity for source of embolus however since there was no evidence of acute infarct on head imaging TEE was not ordered.  Carotid dopplers WNL. HbA1c and LDL WNL. Home lopressor was held on admission to allow for permissive HTN. Neurology was consulted and ordered CK levels which was elevated at 297 which was not thought to be pathological in an african [place].   Left hand cellulitis--Mr Haigler has a pustule on his L 3rd metacarpal-phalangeal joint which expressed scant serosanguinous fluid on pressure and surrounding erythema. No signs of systemic infection with 0/4 SIRS criteria. Wound culture obtained in ED grew staph aureus. He was started on doxy 100mg  BID on admission and completed a 7 day course. His hand appears to be healing well.   Oral lesion-- located on left side of tongue that causes pt pain when eating. Pt states it is new and started 1  day ago. Possibly could be an aphthous ulcer. Will need to f/u with an oral surgeon.   CKD--Mr Franklin's presenting creatinine was 4.27 with GFR 13. Previous GFR 19 in 07/2012. He follows closely w/ a nephrologist from the [place]. Creatinine has remained stable at around 4.0 during hospital course. Will need outpatient f/u with his nephrologist.   Hyperkalemia- Patient with elevated K up to 5.5. He was treated with kayexalate. Denied any chest pain or muscle cramps. Will need repeat bmet on 03/03/14. Patient discharged on Kayexalate 15g daily.   Discharge Vitals:   BP 109/58 mmHg  Pulse 79  Temp(Src) 98.1 F (36.7 C) (Oral)  Resp 16  Ht 5\' 9"  (1.753 m)  Wt 75.6 kg (166 lb 10.7 oz)  BMI 24.60 kg/m2  SpO2 100% Physical Exam General: alert, sitting up in bed, NAD HEENT: [place]/AT, EOMI, mucus membranes moist Neck: c-collar in place CV: RRR, no m/g/r Pulm: CTA bilaterally, breaths non-labored Abd: BS+, soft, non-tender Ext: no edema, bilateral lower extremity hyperpigmented plaques with overlying scales. Left hand pustular cellulitis healing well.  Neuro: alert and oriented x 3, 4/5 left lower extremity and upper extremity strength. Right side 5/5 in both upper and lower extremities.  No changes have occurred or been made since 03/01/14. Patient is stable for discharge.   Discharge Labs:  Results for orders placed or performed during the hospital encounter of 02/22/14 (from the past 24 hour(s))  Glucose, capillary     Status: None   Collection Time: 03/01/14 11:37 AM  Result Value Ref Range   Glucose-Capillary 99 70 - 99 mg/dL   Comment 1 Notify RN    Comment 2 Documented in Chart   Glucose, capillary     Status: Abnormal   Collection Time: 03/01/14  4:37 PM  Result Value Ref Range   Glucose-Capillary 110 (H) 70 - 99 mg/dL  Glucose, capillary     Status: Abnormal   Collection Time: 03/01/14  9:23 PM  Result Value Ref Range   Glucose-Capillary 117 (H) 70 - 99 mg/dL   Comment 1 Notify RN     Comment 2 Documented in Chart   Basic metabolic panel     Status: Abnormal   Collection Time: 03/02/14  4:35 AM  Result Value Ref Range   Sodium 141 135 - 145 mmol/L   Potassium 5.6 (H) 3.5 - 5.1 mmol/L   Chloride 108 96 -  112 mmol/L   CO2 26 19 - 32 mmol/L   Glucose, Bld 88 70 - 99 mg/dL   BUN 67 (H) 6 - 23 mg/dL   Creatinine, Ser 4.21 (H) 0.50 - 1.35 mg/dL   Calcium 9.1 8.4 - 10.5 mg/dL   GFR calc non Af Amer 12 (L) >90 mL/min   GFR calc Af Amer 14 (L) >90 mL/min   Anion gap 7 5 - 15  Glucose, capillary     Status: None   Collection Time: 03/02/14  6:34 AM  Result Value Ref Range   Glucose-Capillary 86 70 - 99 mg/dL    Signed: Albin Felling, MD 03/02/2014, 9:48 AM    Services Ordered on Discharge: SNF Equipment Ordered on Discharge: none

## 2014-03-01 NOTE — Progress Notes (Signed)
Subjective: No acute events overnight. Patient states his left leg feels weaker, but notes this has been ongoing for a few months.  Objective: Vital signs in last 24 hours: Filed Vitals:   03/01/14 0119 03/01/14 0612 03/01/14 0908 03/01/14 1009  BP: 115/58 115/59  105/66  Pulse: 77 99  88  Temp: 99.3 F (37.4 C) 98.2 F (36.8 C)  98.7 F (37.1 C)  TempSrc: Axillary Oral  Oral  Resp: 20 20  20   Height:      Weight:      SpO2: 100% 93% 100% 100%   Weight change:   Intake/Output Summary (Last 24 hours) at 03/01/14 1315 Last data filed at 03/01/14 0017  Gross per 24 hour  Intake    600 ml  Output   1051 ml  Net   -451 ml   Physical Exam General: alert, sitting up in bed, NAD HEENT: [place]/AT, EOMI, mucus membranes moist Neck: c-collar in place CV: RRR, no m/g/r Pulm: CTA bilaterally, breaths non-labored Abd: BS+, soft, non-tender Ext: no edema, bilateral lower extremity hyperpigmented plaques with overlying scales. Left hand pustular cellulitis healing well.  Neuro: alert and oriented x 3, 4/5 left lower extremity and upper extremity strength. Right side 5/5 in both upper and lower extremities.   Lab Results: Basic Metabolic Panel:  Recent Labs Lab 02/28/14 0335 03/01/14 0735  NA 139 140  K 5.4* 5.5*  CL 108 108  CO2 25 26  GLUCOSE 86 92  BUN 54* 61*  CREATININE 3.91* 4.04*  CALCIUM 8.9 9.4   Liver Function Tests:  Recent Labs Lab 02/22/14 1355  AST 31  ALT 11  ALKPHOS 55  BILITOT 0.6  PROT 5.7*  ALBUMIN 2.9*   CBC:  Recent Labs Lab 02/24/14 0456  02/27/14 1839 02/28/14 0335  WBC 7.2  < > 6.8 7.4  NEUTROABS 5.3  --  4.8  --   HGB 9.3*  < > 9.0* 9.0*  HCT 31.1*  < > 29.8* 29.9*  MCV 82.3  < > 82.3 81.9  PLT 151  < > 171 115*  < > = values in this interval not displayed. Cardiac Enzymes:  Recent Labs Lab 02/22/14 1355 02/24/14 1030 02/28/14 0335  CKTOTAL  --  297* 66  CKMB  --  2.9  --   TROPONINI <0.03  --   --    CBG:  Recent  Labs Lab 02/28/14 0616 02/28/14 1134 02/28/14 1634 02/28/14 2157 03/01/14 0655 03/01/14 1137  GLUCAP 81 100* 100* 107* 87 99   Hemoglobin A1C:  Recent Labs Lab 02/23/14 0735  HGBA1C 5.5   Fasting Lipid Panel:  Recent Labs Lab 02/23/14 0735  CHOL 151  HDL 46  LDLCALC 88  TRIG 85  CHOLHDL 3.3   Thyroid Function Tests:  Recent Labs Lab 02/23/14 0735  TSH 1.321   Anemia Panel:  Recent Labs Lab 02/22/14 2250 02/23/14 0014  VITAMINB12  --  400  FOLATE  --  8.5  FERRITIN  --  115  TIBC  --  170*  IRON  --  32*  RETICCTPCT 1.0  --    Urinalysis:  Recent Labs Lab 02/22/14 1349  COLORURINE YELLOW  LABSPEC 1.017  PHURINE 5.0  GLUCOSEU NEGATIVE  HGBUR MODERATE*  BILIRUBINUR NEGATIVE  KETONESUR NEGATIVE  PROTEINUR 30*  UROBILINOGEN 0.2  NITRITE NEGATIVE  LEUKOCYTESUR NEGATIVE     Micro Results: Recent Results (from the past 240 hour(s))  Urine culture     Status: None  Collection Time: 02/22/14  1:49 PM  Result Value Ref Range Status   Specimen Description URINE, CLEAN CATCH  Final   Special Requests NONE  Final   Colony Count   Final    30,000 COLONIES/ML Performed at [facility]    Culture   Final    Multiple bacterial morphotypes present, none predominant. Suggest appropriate recollection if clinically indicated. Performed at [facility]    Report Status 02/23/2014 FINAL  Final  Culture, blood (routine x 2)     Status: None   Collection Time: 02/22/14  8:55 PM  Result Value Ref Range Status   Specimen Description BLOOD RIGHT HAND  Final   Special Requests BOTTLES DRAWN AEROBIC ONLY 5CC  Final   Culture   Final    NO GROWTH 5 DAYS Performed at [facility]    Report Status 03/01/2014 FINAL  Final  Culture, blood (routine x 2)     Status: None   Collection Time: 02/22/14 10:50 PM  Result Value Ref Range Status   Specimen Description BLOOD ARM LEFT  Final   Special Requests BOTTLES DRAWN AEROBIC AND  ANAEROBIC 5CC EA  Final   Culture   Final    NO GROWTH 5 DAYS Performed at [facility]    Report Status 03/01/2014 FINAL  Final  Wound culture     Status: None   Collection Time: 02/22/14 11:31 PM  Result Value Ref Range Status   Specimen Description WOUND LEFT HAND  Final   Special Requests NONE  Final   Gram Stain   Final    NO WBC SEEN NO ORGANISMS SEEN Performed at [facility]    Culture   Final    MODERATE METHICILLIN RESISTANT STAPHYLOCOCCUS AUREUS Note: RIFAMPIN AND GENTAMICIN SHOULD NOT BE USED AS SINGLE DRUGS FOR TREATMENT OF STAPH INFECTIONS. This organism DOES NOT demonstrate inducible Clindamycin resistance in vitro. CRITICAL RESULT CALLED TO, READ BACK BY AND VERIFIED WITH: CNSIAN P @ 1058  ON 02/25/14 BY COOKA Performed at [facility]    Report Status 02/25/2014 FINAL  Final   Organism ID, Bacteria METHICILLIN RESISTANT STAPHYLOCOCCUS AUREUS  Final      Susceptibility   Methicillin resistant staphylococcus aureus - MIC*    CLINDAMYCIN <=0.25 SENSITIVE Sensitive     ERYTHROMYCIN >=8 RESISTANT Resistant     GENTAMICIN <=0.5 SENSITIVE Sensitive     LEVOFLOXACIN 0.25 SENSITIVE Sensitive     OXACILLIN >=4 RESISTANT Resistant     PENICILLIN >=0.5 RESISTANT Resistant     RIFAMPIN <=0.5 SENSITIVE Sensitive     TRIMETH/SULFA <=10 SENSITIVE Sensitive     VANCOMYCIN 1 SENSITIVE Sensitive     TETRACYCLINE <=1 SENSITIVE Sensitive     * MODERATE METHICILLIN RESISTANT STAPHYLOCOCCUS AUREUS  MRSA PCR Screening     Status: Abnormal   Collection Time: 02/23/14 12:02 AM  Result Value Ref Range Status   MRSA by PCR POSITIVE (A) NEGATIVE Final    Comment:        The GeneXpert MRSA Assay (FDA approved for NASAL specimens only), is one component of a comprehensive MRSA colonization surveillance program. It is not intended to diagnose MRSA infection nor to guide or monitor treatment for MRSA infections. RESULT CALLED TO, READ BACK BY AND VERIFIED  WITH: BASS,S RN 0304 02/23/14 MITCHELL,L    Medications: I have reviewed the patient's current medications. Scheduled Meds: . aspirin EC  81 mg Oral Daily  . calcitRIOL  0.25 mcg Oral Q  M,W,F  . docusate sodium  100 mg Oral QHS  . feeding supplement (GLUCERNA SHAKE)  237 mL Oral BID  . fluocinonide ointment  1 application Topical BID  . heparin  5,000 Units Subcutaneous 3 times per day  . multivitamin with minerals  1 tablet Oral Daily  . polycarbophil  1,250 mg Oral Daily  . sodium polystyrene  15 g Oral Once  . terazosin  1 mg Oral QHS  . tiotropium  18 mcg Inhalation Daily   Continuous Infusions: . sodium chloride 20 mL/hr at 02/25/14 1300   PRN Meds:.albuterol, fentaNYL, HYDROcodone-acetaminophen, lidocaine, LORazepam, meperidine (DEMEROL) injection, promethazine Assessment/Plan: Principal Problem:   Weakness Active Problems:   Acute renal failure   Chronic kidney disease (CKD), stage IV (severe)   COPD (chronic obstructive pulmonary disease)   Thrombocytopenia   Anemia   Hand lesion   Hypertension   Malnutrition of moderate degree   Cervical stenosis of spinal canal  Cervical Stenosis - No new neurological deficits.  -Continue aspen c-collar  -Dr. Ronnald Ramp (neurosurgery) states surgery cannot be done here because [facility] will not approve it. I have touched base with Lorrie, coordinator at the [facility], and [place] (social work at [facility]) and his discharge summary will be faxed to his PCP at the [place] so that he can be referred to neurosurgery at the [place].  - Patient to go to SNF today  Hyperkalemia - K 5.5. Cr ~4.0, appears to be baseline since 07/2012.  - Will give Kayexalate  - Needs repeat bmet tomorrow at SNF  Left hand pustular cellulitis - Healing well.  - Stop doxycycline today, completed 7 day course   Mouth Ulcer - Improved pain.  - Continue lidocaine Q 6 hr pain  ACD - Hg stable at 9 with no active bleeding.  - Monitor CBC and f/u FOBT  COPD - Stable  with no exacerbation - Continue spiriva 18 mcg daily & albuterol 2 puff PRN   Diet: Heart healthy/Carb modified VTE PPx: Heparin SQ Dispo: Discharge to SNF today  The patient does have a current PCP (Provider Default, MD) and does need an [facility] hospital follow-up appointment after discharge.  The patient does not have transportation limitations that hinder transportation to clinic appointments.  .Services Needed at time of discharge: Y = Yes, Blank = No PT:   OT:   RN:   Equipment:   Other:     LOS: 7 days   Albin Felling, MD 03/01/2014, 1:15 PM

## 2014-03-01 NOTE — Progress Notes (Signed)
I discussed with RN CM. Most appropriate rehab venue is SNF at this time and this has been initiated this weekend. [phone]

## 2014-03-01 NOTE — Progress Notes (Signed)
Attempted to call report to [facility] at [place].  Message/phone number left for RN to call back, will reattempt.  Will continue to monitor. Cori Razor, RN

## 2014-03-01 NOTE — Progress Notes (Signed)
Internal Medicine Attending  Date: 03/01/2014  Patient name: Brad Jones Medical record number: [id number] Date of birth: 07-22-1927 Age: 81 y.o. Gender: male  I saw and evaluated the patient. I reviewed the resident's note by Brad Jones and I agree with the resident's findings and plans as documented in her progress note.  Briefly, Brad Jones has weakness of the left lower extremity secondary to cervical spinal stenosis requiring neurosurgical decompression. Neurosurgery feels this procedure is not urgent and the patient wishes to pursue further surgical intervention at the [facility] where he has been followed for the last 30 years. Physical exam today is notable for 4/5 strength in the extensors and flexors of the left lower extremity.  Skilled nursing facility transfer has been arranged for today until the [place] has an opportunity to plan for cervical decompression.

## 2014-03-01 NOTE — Progress Notes (Signed)
Received phone call from patient's daughter Carol [place] who stated that she is also the patient's HCPOA. She is refusing for patient to be transported to [facility]. She states that she wants the patient somewhere else "because I heard from my sister that it is nasty there." This Probation officer informed her that transport was already on the way and [facility] was expecting the patient. She stated "I don't care, I don't want him going there." Paged on call MD, Dr. Arcelia Jew and notified her of situation. Discharge orders cancelled, also notified Jody, CSW to notify [facility].

## 2014-03-01 NOTE — Clinical Social Work Note (Signed)
Clinical Social Worker facilitated patient discharge including contacting patient family and facility to confirm patient discharge plans.  Clinical information faxed to facility and family agreeable with plan.  CSW arranged ambulance transport via [facility] to [facility] [place].  RN to call report prior to discharge.  Clinical Social Worker will sign off for now as social work intervention is no longer needed. Please consult [place] again if new need arises.  Glendon Axe, MSW, LCSWA [phone] 03/01/2014 2:25 PM

## 2014-03-01 NOTE — Progress Notes (Signed)
Orthopedic Tech Progress Note Patient Details:  Brad Jones Nov 26, 1927 [id number]  Patient ID: Brad Jones, male   DOB: Jan 28, 1928, 81 y.o.   MRN: [id number] Called [employer] for aspen collar replacement liners; spoke with Brad Jones, Brad Jones 03/01/2014, 2:28 PM

## 2014-03-01 NOTE — Clinical Social Work Note (Signed)
Patient has a bed at SNF, [facility] [place]. MD notified. FL-2 on chart for MD signature.   CSW continues to follow patient and pt's family for continued support and to facilitate pt's discharge needs.   Glendon Axe, MSW, LCSWA [phone] 03/01/2014 12:52 PM

## 2014-03-01 NOTE — Progress Notes (Signed)
It appears the patient will be discharged to a skilled nursing facility. It appears the [place] will not approve him having surgery here since it is not emergent. However, the [facility] refused to have him transferred to their hospital because of the lack of need for urgent or emergent surgery. I would certainly be happy to perform his surgery here if it were approved. Since it will not be, I think he should follow-up with the neurosurgery clinic at the [facility] ASAP upon discharge from this facility. He should remain in his cervical collar at all times. I think he needs a posterior cervical decompression and instrumented fusion at C3-4 and C4-5, and I feel this should be done within 2 weeks of discharge from this facility. Please let me know if I can be of further assistance.

## 2014-03-01 NOTE — Clinical Social Work Placement (Signed)
Clinical Social Work Department CLINICAL SOCIAL WORK PLACEMENT NOTE 03/01/2014  Patient:  Brad Jones, Brad Jones  Account Number:  [id number] Admit date:  02/22/2014  Clinical Social Worker:  Glendon Axe, CLINICAL SOCIAL WORKER  Date/time:  03/01/2014 11:09 AM  Clinical Social Work is seeking post-discharge placement for this patient at the following level of care:   [place]   (*CSW will update this form in Epic as items are completed)   02/27/2014  Patient/family provided with [place] Department of Clinical Social Work's list of facilities offering this level of care within the geographic area requested by the patient (or if unable, by the patient's family).  02/27/2014  Patient/family informed of their freedom to choose among providers that offer the needed level of care, that participate in Medicare, Medicaid or managed care program needed by the patient, have an available bed and are willing to accept the patient.  02/27/2014  Patient/family informed of [facility]' ownership interest in [facility], as well as of the fact that they are under no obligation to receive care at this facility.  PASARR submitted to EDS on 02/27/2014 PASARR number received on 02/27/2014  FL2 transmitted to all facilities in geographic area requested by pt/family on  02/27/2014 FL2 transmitted to all facilities within larger geographic area on   Patient informed that his/her managed care company has contracts with or will negotiate with  certain facilities, including the following:   YES     Patient/family informed of bed offers received:  03/01/2014 Patient chooses bed at [facility] Physician recommends and patient chooses bed at    Patient to be transferred to [facility] on  03/01/2014 Patient to be transferred to facility by [facility] Patient and family notified of transfer on 03/01/2014 Name of family member notified:  Pt's wife and dtr, [name]  The following  physician request were entered in Epic:   Additional Comments:   Glendon Axe, MSW, [place] [phone]

## 2014-03-02 LAB — GLUCOSE, CAPILLARY
GLUCOSE-CAPILLARY: 86 mg/dL (ref 70–99)
GLUCOSE-CAPILLARY: 113 mg/dL — AB (ref 70–99)

## 2014-03-02 LAB — BASIC METABOLIC PANEL
Anion gap: 7 (ref 5–15)
Anion gap: 11 (ref 5–15)
BUN: 67 mg/dL — AB (ref 6–23)
BUN: 69 mg/dL — ABNORMAL HIGH (ref 6–23)
CALCIUM: 9.1 mg/dL (ref 8.4–10.5)
CALCIUM: 8.8 mg/dL (ref 8.4–10.5)
CO2: 26 mmol/L (ref 19–32)
CO2: 22 mmol/L (ref 19–32)
CREATININE: 4.06 mg/dL — AB (ref 0.50–1.35)
Chloride: 108 mmol/L (ref 96–112)
Chloride: 108 mmol/L (ref 96–112)
Creatinine, Ser: 4.21 mg/dL — ABNORMAL HIGH (ref 0.50–1.35)
GFR calc Af Amer: 14 mL/min — ABNORMAL LOW (ref >90)
GFR calc Af Amer: 14 mL/min — ABNORMAL LOW (ref >90)
GFR, EST NON AFRICAN AMERICAN: 12 mL/min — AB (ref >90)
GFR, EST NON AFRICAN AMERICAN: 12 mL/min — AB (ref >90)
GLUCOSE: 103 mg/dL — AB (ref 70–99)
Glucose, Bld: 88 mg/dL (ref 70–99)
POTASSIUM: 5.9 mmol/L — AB (ref 3.5–5.1)
Potassium: 5.6 mmol/L — ABNORMAL HIGH (ref 3.5–5.1)
Sodium: 141 mmol/L (ref 135–145)
Sodium: 141 mmol/L (ref 135–145)

## 2014-03-02 MED ORDER — SODIUM POLYSTYRENE SULFONATE 15 GM/60ML PO SUSP
30.0000 g | Freq: Once | ORAL | Status: AC
  Administered 2014-03-02: 30 g via ORAL
  Filled 2014-03-02: qty 120

## 2014-03-02 MED ORDER — SODIUM POLYSTYRENE SULFONATE PO POWD: Freq: Every day | ORAL | Status: AC

## 2014-03-02 NOTE — Clinical Social Work Note (Signed)
Clinical Social Worker spoke with pt's dtr [name] in reference to post-acute placement. Pt's dtr reported she would like pt to be reconsidered for [facility] with second choice being [facility] and [facility].  CSW submitted pt's clinicals to SNF's in [facility]. For review. Pt's family prepared for dc today. CSW awaiting bed offers.   DC packet prepared and on chart for transport.   Glendon Axe, MSW, LCSWA [phone] 03/02/2014 10:33 AM

## 2014-03-02 NOTE — Clinical Social Work Note (Signed)
Clinical Social Worker spoke with pt's dtr to present bed offers. [facility] does not have any male beds available. Pt's dtr is currently meeting with admissions coordinator of [facility] to complete admissions paperwork.    Clinical Social Worker facilitated patient discharge including contacting patient family and facility to confirm patient discharge plans.  Clinical information faxed to facility and family agreeable with plan.  CSW arranged ambulance transport via [facility] to [place].  RN to call report prior to discharge.  Clinical Social Worker will sign off for now as social work intervention is no longer needed. Please consult [place] again if new need arises.  Glendon Axe, MSW, LCSWA [phone] 03/02/2014 12:45 PM

## 2014-03-02 NOTE — Progress Notes (Signed)
Subjective: Patient denies any new tingling/numbness and weakness. Will try to get into a different SNF today. Will follow up with social work.  Objective: Vital signs in last 24 hours: Filed Vitals:   03/01/14 1753 03/01/14 2121 03/02/14 0054 03/02/14 0700  BP: 111/55 118/59 104/52 108/53  Pulse: 82  82 79  Temp: 98 F (36.7 C) 98.3 F (36.8 C) 98.5 F (36.9 C) 98 F (36.7 C)  TempSrc: Oral Oral Oral Oral  Resp: 20 18 18 16   Height:      Weight:      SpO2: 100% 100% 100% 100%   Weight change:  No intake or output data in the 24 hours ending 03/02/14 0728  Physical Exam General: alert, resting comfortably in bed, NAD HEENT: [place]/AT, EOMI, mucus membranes moist Neck: c-collar in place CV: RRR, no m/g/r Pulm: CTA bilaterally, breaths non-labored Abd: BS+, soft, non-tender Ext: no edema, bilateral lower extremity hyperpigmented plaques with overlying scales. Left hand pustular cellulitis healing well.  Neuro: alert and oriented x 3, 4/5 left lower extremity and upper extremity strength. Right side 5/5 in both upper and lower extremities.   Lab Results: Basic Metabolic Panel:  Recent Labs Lab 03/01/14 0735 03/02/14 0435  NA 140 141  K 5.5* 5.6*  CL 108 108  CO2 26 26  GLUCOSE 92 88  BUN 61* 67*  CREATININE 4.04* 4.21*  CALCIUM 9.4 9.1   CBC:  Recent Labs Lab 02/24/14 0456  02/27/14 1839 02/28/14 0335  WBC 7.2  < > 6.8 7.4  NEUTROABS 5.3  --  4.8  --   HGB 9.3*  < > 9.0* 9.0*  HCT 31.1*  < > 29.8* 29.9*  MCV 82.3  < > 82.3 81.9  PLT 151  < > 171 115*  < > = values in this interval not displayed. Cardiac Enzymes:  Recent Labs Lab 02/24/14 1030 02/28/14 0335  CKTOTAL 297* 66  CKMB 2.9  --    CBG:  Recent Labs Lab 02/28/14 2157 03/01/14 0655 03/01/14 1137 03/01/14 1637 03/01/14 2123 03/02/14 0634  GLUCAP 107* 87 99 110* 117* 86   Hemoglobin A1C:  Recent Labs Lab 02/23/14 0735  HGBA1C 5.5   Fasting Lipid Panel:  Recent Labs Lab  02/23/14 0735  CHOL 151  HDL 46  LDLCALC 88  TRIG 85  CHOLHDL 3.3   Thyroid Function Tests:  Recent Labs Lab 02/23/14 0735  TSH 1.321    Micro Results: Recent Results (from the past 240 hour(s))  Urine culture     Status: None   Collection Time: 02/22/14  1:49 PM  Result Value Ref Range Status   Specimen Description URINE, CLEAN CATCH  Final   Special Requests NONE  Final   Colony Count   Final    30,000 COLONIES/ML Performed at [facility]    Culture   Final    Multiple bacterial morphotypes present, none predominant. Suggest appropriate recollection if clinically indicated. Performed at [facility]    Report Status 02/23/2014 FINAL  Final  Culture, blood (routine x 2)     Status: None   Collection Time: 02/22/14  8:55 PM  Result Value Ref Range Status   Specimen Description BLOOD RIGHT HAND  Final   Special Requests BOTTLES DRAWN AEROBIC ONLY 5CC  Final   Culture   Final    NO GROWTH 5 DAYS Performed at [facility]    Report Status 03/01/2014 FINAL  Final  Culture, blood (routine x 2)  Status: None   Collection Time: 02/22/14 10:50 PM  Result Value Ref Range Status   Specimen Description BLOOD ARM LEFT  Final   Special Requests BOTTLES DRAWN AEROBIC AND ANAEROBIC 5CC EA  Final   Culture   Final    NO GROWTH 5 DAYS Performed at [facility]    Report Status 03/01/2014 FINAL  Final  Wound culture     Status: None   Collection Time: 02/22/14 11:31 PM  Result Value Ref Range Status   Specimen Description WOUND LEFT HAND  Final   Special Requests NONE  Final   Gram Stain   Final    NO WBC SEEN NO ORGANISMS SEEN Performed at [facility]    Culture   Final    MODERATE METHICILLIN RESISTANT STAPHYLOCOCCUS AUREUS Note: RIFAMPIN AND GENTAMICIN SHOULD NOT BE USED AS SINGLE DRUGS FOR TREATMENT OF STAPH INFECTIONS. This organism DOES NOT demonstrate inducible Clindamycin resistance in vitro. CRITICAL RESULT CALLED TO,  READ BACK BY AND VERIFIED WITH: CNSIAN P @ 1058  ON 02/25/14 BY COOKA Performed at [facility]    Report Status 02/25/2014 FINAL  Final   Organism ID, Bacteria METHICILLIN RESISTANT STAPHYLOCOCCUS AUREUS  Final      Susceptibility   Methicillin resistant staphylococcus aureus - MIC*    CLINDAMYCIN <=0.25 SENSITIVE Sensitive     ERYTHROMYCIN >=8 RESISTANT Resistant     GENTAMICIN <=0.5 SENSITIVE Sensitive     LEVOFLOXACIN 0.25 SENSITIVE Sensitive     OXACILLIN >=4 RESISTANT Resistant     PENICILLIN >=0.5 RESISTANT Resistant     RIFAMPIN <=0.5 SENSITIVE Sensitive     TRIMETH/SULFA <=10 SENSITIVE Sensitive     VANCOMYCIN 1 SENSITIVE Sensitive     TETRACYCLINE <=1 SENSITIVE Sensitive     * MODERATE METHICILLIN RESISTANT STAPHYLOCOCCUS AUREUS  MRSA PCR Screening     Status: Abnormal   Collection Time: 02/23/14 12:02 AM  Result Value Ref Range Status   MRSA by PCR POSITIVE (A) NEGATIVE Final    Comment:        The GeneXpert MRSA Assay (FDA approved for NASAL specimens only), is one component of a comprehensive MRSA colonization surveillance program. It is not intended to diagnose MRSA infection nor to guide or monitor treatment for MRSA infections. RESULT CALLED TO, READ BACK BY AND VERIFIED WITH: BASS,S RN 0304 02/23/14 MITCHELL,L    Medications: I have reviewed the patient's current medications. Scheduled Meds: . aspirin EC  81 mg Oral Daily  . calcitRIOL  0.25 mcg Oral Q M,W,F  . docusate sodium  100 mg Oral QHS  . feeding supplement (GLUCERNA SHAKE)  237 mL Oral BID  . fluocinonide ointment  1 application Topical BID  . heparin  5,000 Units Subcutaneous 3 times per day  . multivitamin with minerals  1 tablet Oral Daily  . polycarbophil  1,250 mg Oral Daily  . terazosin  1 mg Oral QHS  . tiotropium  18 mcg Inhalation Daily   Continuous Infusions: . sodium chloride 20 mL/hr at 02/25/14 1300   PRN Meds:.albuterol, fentaNYL, HYDROcodone-acetaminophen, lidocaine,  LORazepam, meperidine (DEMEROL) injection, promethazine Assessment/Plan: Principal Problem:   Weakness Active Problems:   Acute renal failure   Chronic kidney disease (CKD), stage IV (severe)   COPD (chronic obstructive pulmonary disease)   Thrombocytopenia   Anemia   Hand lesion   Hypertension   Malnutrition of moderate degree   Cervical stenosis of spinal canal   Weakness of lower extremity Cervical Stenosis - No  new neurological deficits.  - Continue aspen c-collar  - Dr. Ronnald Ramp (neurosurgery) states surgery cannot be done here because [facility] will not approve it. I have touched base with [place], coordinator at the [facility], and [place] (social work at [facility]) and his discharge summary will be faxed to his PCP at the [place] so that he can be referred to neurosurgery at the [place].  - Patient hopefully to go to SNF today  Hyperkalemia - K 5.6. Cr ~4.0, appears to be baseline since 07/2012. Hyperkalemia likely due to CKD. Patient does not wish to pursue dialysis.  - Will give Kayexalate 30g - Repeat bmet this afternoon - Needs repeat bmet tomorrow at SNF  Left hand pustular cellulitis - Healing well.  - Doxycycline stopped 2/1  Mouth Ulcer - Improved pain.  - Continue lidocaine Q 6 hr pain  ACD - Hg stable at 9 with no active bleeding.  - Monitor CBC and f/u FOBT  COPD - Stable with no exacerbation - Continue spiriva 18 mcg daily & albuterol 2 puff PRN   Diet: Heart healthy/Carb modified VTE PPx: Heparin SQ Dispo: Discharge hopefully in the next few days to another SNF. Family refused him going to [place] SNF yesterday. Will follow up with social work.   The patient does have a current PCP (Provider Default, MD) and does need an [facility] hospital follow-up appointment after discharge.  The patient does not have transportation limitations that hinder transportation to clinic appointments.  .Services Needed at time of discharge: Y = Yes, Blank = No PT:   OT:   RN:   Equipment:     Other:     LOS: 8 days   Albin Felling, MD 03/02/2014, 7:28 AM

## 2014-03-02 NOTE — Progress Notes (Signed)
D/C orders received, pt for D/C to skilled nursing facility.  IV and telemetry D/C.  Rx and D/C instructions given with verbalized understanding. EMS to transport to new facility

## 2014-03-02 NOTE — Progress Notes (Signed)
Internal Medicine Attending  Date: 03/02/2014  Patient name: Brad Jones Medical record number: [id number] Date of birth: [birth year] Age: 81 y.o. Gender: male  I saw and evaluated the patient. I reviewed the resident's note by Dr. Arcelia Jew and I agree with the resident's findings and plans as documented in her progress note.  Brad Jones family has decided upon a skilled nursing facility and he is being discharged today. He will follow-up in the [facility] where neurosurgery evaluation and spinal decompression can take place.

## 2014-03-03 ENCOUNTER — Non-Acute Institutional Stay (SKILLED_NURSING_FACILITY): Payer: Medicare Other | Admitting: Internal Medicine

## 2014-03-03 DIAGNOSIS — R531 Weakness: Secondary | ICD-10-CM

## 2014-03-03 DIAGNOSIS — L03114 Cellulitis of left upper limb: Secondary | ICD-10-CM

## 2014-03-03 DIAGNOSIS — K1370 Unspecified lesions of oral mucosa: Secondary | ICD-10-CM

## 2014-03-03 DIAGNOSIS — E875 Hyperkalemia: Secondary | ICD-10-CM

## 2014-03-03 DIAGNOSIS — N184 Chronic kidney disease, stage 4 (severe): Secondary | ICD-10-CM

## 2014-03-03 DIAGNOSIS — M4802 Spinal stenosis, cervical region: Secondary | ICD-10-CM

## 2014-03-03 NOTE — Progress Notes (Signed)
MRN: [id number] Name: Brad Jones  Sex: male Age: 81 y.o. DOB: [birth year]  [place] #: Helene [facility]p Facility/Room: 212 Level Of Care: SNF Provider: Inocencio Homes D Emergency Contacts: Extended Emergency Contact Information Primary Emergency Contact: Cannady,Anita Address: [address]          [place], [place] [address] [place] Phone: [phone] Relation: Spouse Secondary Emergency Contact: [place],Carolyn  [place] Mobile Phone: [phone] Relation: Daughter     Allergies: Review of patient's allergies indicates no known allergies.  Chief Complaint  Patient presents with  . New Admit To SNF    HPI: Patient is 81 y.o. male who was hospitalized for weakness with negative work-up except for cervical stenosis who is admitted to SNF for OT/PT.  Past Medical History  Diagnosis Date  . Hypertension   . CHF (congestive heart failure)   . COPD (chronic obstructive pulmonary disease)     use 2 1/2 L oxygen at nights  . Emphysema   . Diabetes mellitus without complication     non insulin dependent  . Cancer     prostate  . Renal disorder kidneys only function at 20%    pt does not want dialysis  . Stroke     TIA, multiple    Past Surgical History  Procedure Laterality Date  . Prostatectomy    . Radiology with anesthesia N/A 02/25/2014    Procedure: RADIOLOGY WITH ANESTHESIA;  Surgeon: Medication Radiologist, MD;  Location: [place] [facility];  Service: Radiology;  Laterality: N/A;      Medication List       This list is accurate as of: 03/03/14 11:59 PM.  Always use your most recent med list.               albuterol 108 (90 BASE) MCG/ACT inhaler  Commonly known as:  PROVENTIL HFA;VENTOLIN HFA  Inhale 2 puffs into the lungs every 6 (six) hours as needed for wheezing.     aspirin 81 MG tablet  Take 81 mg by mouth daily.     calcitRIOL 0.25 MCG capsule  Commonly known as:  ROCALTROL  Take 0.25 mcg by mouth every Monday, Wednesday, and Friday.      fluocinonide ointment 0.05 %  Commonly known as:  LIDEX  Apply 1 application topically 2 (two) times daily.     HYDROcodone-acetaminophen 5-325 MG per tablet  Commonly known as:  NORCO/VICODIN  Take 1 tablet by mouth every 6 (six) hours as needed for pain.     metoprolol succinate 100 MG 24 hr tablet  Commonly known as:  TOPROL-XL  Take 50 mg by mouth daily. Take with or immediately following a meal.     nitroGLYCERIN 0.4 MG SL tablet  Commonly known as:  NITROSTAT  Place 0.4 mg under the tongue every 5 (five) minutes as needed for chest pain.     polycarbophil 625 MG tablet  Commonly known as:  FIBERCON  Take 2 tablets (1,250 mg total) by mouth daily. Over the counter to help bulk stools.     sodium polystyrene powder  Commonly known as:  KAYEXALATE  Take by mouth daily. Take 15 g daily for hyperkalemia     terazosin 1 MG capsule  Commonly known as:  HYTRIN  Take 1 mg by mouth at bedtime.     tiotropium 18 MCG inhalation capsule  Commonly known as:  SPIRIVA  Place 18 mcg into inhaler and inhale daily.        No orders of  the defined types were placed in this encounter.    Immunization History  Administered Date(s) Administered  . Pneumococcal Polysaccharide-23 08/13/2012    History  Substance Use Topics  . Smoking status: Former Smoker    Quit date: 03/20/2012  . Smokeless tobacco: Never Used  . Alcohol Use: No    Family history is noncontributory    Review of Systems  DATA OBTAINED: from patient, nurse GENERAL:  no fevers, fatigue, appetite changes SKIN: No itching, rash or wounds EYES: No eye pain, redness, discharge EARS: No earache, tinnitus, change in hearing NOSE: No congestion, drainage or bleeding  MOUTH/THROAT: No mouth or tooth pain, No sore throat RESPIRATORY: No cough, wheezing, SOB CARDIAC: No chest pain, palpitations, lower extremity edema  GI: No abdominal pain, No N/V/D or constipation, No heartburn or reflux  GU: No dysuria,  frequency or urgency, or incontinence  MUSCULOSKELETAL: No unrelieved bone/joint pain NEUROLOGIC: No headache, dizziness or focal weakness PSYCHIATRIC: No overt anxiety or sadness, No behavior issue.   Filed Vitals:   03/03/14 2118  BP: 119/76  Pulse: 78  Temp: 98.4 F (36.9 C)  Resp: 20    Physical Exam  GENERAL APPEARANCE: Alert,  No acute distress. BM SKIN: No diaphoresis rash HEAD: Normocephalic, atraumatic  EYES: Conjunctiva/lids clear. Pupils round, reactive. EOMs intact.  EARS: External exam WNL, canals clear. Hearing grossly normal.  NOSE: No deformity or discharge.  MOUTH/THROAT: Lips w/o lesions  RESPIRATORY: Breathing is even, unlabored. Lung sounds are clear   CARDIOVASCULAR: Heart RRR no murmurs, rubs or gallops. No peripheral edema.   GASTROINTESTINAL: Abdomen is soft, non-tender, not distended w/ normal bowel sounds. GENITOURINARY: Bladder non tender, not distended  MUSCULOSKELETAL: No abnormal joints or musculature NEUROLOGIC:  Cranial nerves 2-12 grossly intact PSYCHIATRIC: Mood and affect appropriate to situation, no behavioral issues  Patient Active Problem List   Diagnosis Date Noted  . Cellulitis of hand, left 03/07/2014  . Oral lesion 03/07/2014  . Hyperkalemia 03/07/2014  . Weakness of lower extremity   . Cervical stenosis of spinal canal   . Malnutrition of moderate degree 02/23/2014  . Weakness 02/22/2014  . Anemia 02/22/2014  . Hand lesion 02/22/2014  . Hypertension 02/22/2014  . Diastolic dysfunction 59/45/8592  . OSA (obstructive sleep apnea)--non compliant with CPAP 08/21/2012  . Physical deconditioning 08/19/2012  . Chronic kidney disease (CKD), stage IV (severe) 08/19/2012  . COPD (chronic obstructive pulmonary disease) 08/19/2012  . Thrombocytopenia 08/19/2012  . Macular degeneration 08/19/2012  . Acute renal failure 08/10/2012  . Septic shock(785.52) 08/10/2012    CBC    Component Value Date/Time   WBC 7.4 02/28/2014 0335    RBC 3.65* 02/28/2014 0335   RBC 3.88* 02/22/2014 2250   HGB 9.0* 02/28/2014 0335   HCT 29.9* 02/28/2014 0335   PLT 115* 02/28/2014 0335   MCV 81.9 02/28/2014 0335   LYMPHSABS 1.0 02/27/2014 1839   MONOABS 0.7 02/27/2014 1839   EOSABS 0.3 02/27/2014 1839   BASOSABS 0.0 02/27/2014 1839    CMP     Component Value Date/Time   NA 141 03/02/2014 1300   K 5.9* 03/02/2014 1300   CL 108 03/02/2014 1300   CO2 22 03/02/2014 1300   GLUCOSE 103* 03/02/2014 1300   BUN 69* 03/02/2014 1300   CREATININE 4.06* 03/02/2014 1300   CALCIUM 8.8 03/02/2014 1300   PROT 5.7* 02/22/2014 1355   ALBUMIN 2.9* 02/22/2014 1355   AST 31 02/22/2014 1355   ALT 11 02/22/2014 1355   ALKPHOS 55 02/22/2014  1355   BILITOT 0.6 02/22/2014 1355   GFRNONAA 12* 03/02/2014 1300   GFRAA 14* 03/02/2014 1300    Assessment and Plan  Cervical stenosis of spinal canal With question of spinal cord contusion C3-4; admit SNF for OT/PT; pt's follow up at [facility] is in the works   Weakness TIA work up completed and was negative. 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 Neurosurgery saw patient and recommended cervical decompression and stabilization. Dr. Ronnald Ramp discussed w/ radiologist results of CT c spine. Believe that there could be a potential fracture at C4-C5. Aspen C collar ordered for pt on transfer to [place]. CT head and MRI/MRA head negative for any acute infarct. 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 ECHO was limited and could not detect focal wall motion abnormalities. TEE was recommended for better sensitivity for source of embolus however since there was no evidence of acute infarct on head imaging TEE was not ordered. Carotid  dopplers WNL. HbA1c and LDL WNL. Home lopressor was held on admission to allow for permissive HTN. Neurology was consulted and ordered CK levels which was elevated at 297 which was not thought to be pathological in an african [place].     Cellulitis of hand, left --Mr Placeres has a pustule on his L 3rd metacarpal-phalangeal joint which expressed scant serosanguinous fluid on pressure and surrounding erythema. No signs of systemic infection with 0/4 SIRS criteria. Wound culture obtained in ED grew staph aureus. He was started on doxy 100mg  BID on admission and completed a 7 day course. His hand appears to be healing well.   Oral lesion located on left side of tongue that causes pt pain when eating. Pt states it is new and started 1 day ago. Possibly could be an aphthous ulcer. Will need to f/u with an oral surgeon,    Chronic kidney disease (CKD), stage IV (severe) --Mr Ainsley's presenting creatinine was 4.27 with GFR 13. Previous GFR 19 in 07/2012. He follows closely w/ a nephrologist from the [place]. Creatinine has remained stable at around 4.0 during hospital course. Will need outpatient f/u with his nephrologist   Hyperkalemia Patient with elevated K up to 5.5. He was treated with kayexalate. Denied any chest pain or muscle cramps. Will need repeat bmet on 03/03/14. Patient discharged on Kayexalate 15g daily.       Hennie Duos, MD

## 2014-03-05 MED FILL — Glycopyrrolate Inj 0.2 MG/ML: INTRAMUSCULAR | Qty: 3 | Status: AC

## 2014-03-05 MED FILL — Neostigmine Methylsulfate IV Soln 10 MG/10 ML (1 MG/ML): INTRAVENOUS | Qty: 4 | Status: AC

## 2014-03-05 MED FILL — Succinylcholine Chloride Inj 20 MG/ML: INTRAMUSCULAR | Qty: 10 | Status: AC

## 2014-03-05 MED FILL — Phenylephrine HCl Inj 10 MG/ML: INTRAMUSCULAR | Qty: 1 | Status: AC

## 2014-03-05 MED FILL — Fentanyl Citrate Inj 0.05 MG/ML: INTRAMUSCULAR | Qty: 2 | Status: AC

## 2014-03-05 MED FILL — Midazolam HCl Inj 2 MG/2ML (Base Equivalent): INTRAMUSCULAR | Qty: 2 | Status: AC

## 2014-03-05 MED FILL — Propofol IV Emul 200 MG/20ML (10 MG/ML): INTRAVENOUS | Qty: 20 | Status: AC

## 2014-03-07 ENCOUNTER — Encounter: Payer: Self-pay | Admitting: Internal Medicine

## 2014-03-07 DIAGNOSIS — E875 Hyperkalemia: Secondary | ICD-10-CM | POA: Insufficient documentation

## 2014-03-07 DIAGNOSIS — K1370 Unspecified lesions of oral mucosa: Secondary | ICD-10-CM | POA: Insufficient documentation

## 2014-03-07 DIAGNOSIS — L03114 Cellulitis of left upper limb: Secondary | ICD-10-CM | POA: Insufficient documentation

## 2014-03-07 NOTE — Assessment & Plan Note (Signed)
--  Brad Jones has a pustule on his L 3rd metacarpal-phalangeal joint which expressed scant serosanguinous fluid on pressure and surrounding erythema. No signs of systemic infection with 0/4 SIRS criteria. Wound culture obtained in ED grew staph aureus. He was started on doxy 100mg  BID on admission and completed a 7 day course. His hand appears to be healing well.

## 2014-03-07 NOTE — Assessment & Plan Note (Signed)
--  Brad Jones's presenting creatinine was 4.27 with GFR 13. Previous GFR 19 in 07/2012. He follows closely w/ a nephrologist from the [place]. Creatinine has remained stable at around 4.0 during hospital course. Will need outpatient f/u with his nephrologist

## 2014-03-07 NOTE — Assessment & Plan Note (Signed)
located on left side of tongue that causes pt pain when eating. Pt states it is new and started 1 day ago. Possibly could be an aphthous ulcer. Will need to f/u with an oral surgeon,

## 2014-03-07 NOTE — Assessment & Plan Note (Signed)
TIA work up completed and was negative. 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 Neurosurgery saw patient and recommended cervical decompression and stabilization. Dr. Ronnald Ramp discussed w/ radiologist results of CT c spine. Believe that there could be a potential fracture at C4-C5. Aspen C collar ordered for pt on transfer to [place]. CT head and MRI/MRA head negative for any acute infarct. 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 ECHO was limited and could not detect focal wall motion abnormalities. TEE was recommended for better sensitivity for source of embolus however since there was no evidence of acute infarct on head imaging TEE was not ordered. Carotid dopplers WNL. HbA1c and LDL WNL. Home lopressor was held on admission to allow for permissive HTN. Neurology was consulted and ordered CK levels which was elevated at 297 which was not thought to be pathological in an african [place].

## 2014-03-07 NOTE — Assessment & Plan Note (Signed)
Patient with elevated K up to 5.5. He was treated with kayexalate. Denied any chest pain or muscle cramps. Will need repeat bmet on 03/03/14. Patient discharged on Kayexalate 15g daily.

## 2014-03-07 NOTE — Assessment & Plan Note (Signed)
With question of spinal cord contusion C3-4; admit SNF for OT/PT; pt's follow up at [facility] is in the works

## 2014-03-17 ENCOUNTER — Other Ambulatory Visit: Payer: Self-pay

## 2014-03-17 MED ORDER — HYDROCODONE-ACETAMINOPHEN 5-325 MG PO TABS: 1.0000 | ORAL_TABLET | Freq: Four times a day (QID) | ORAL | Status: AC | PRN

## 2014-03-17 NOTE — Telephone Encounter (Signed)
Faxed to [facility] Fax Number: [phone], Phone Number [phone]  

## 2015-04-29 IMAGING — CT CT HEAD W/O CM
1 series · 16 of 29 positions shown, 20 images · non-contrast
Comparison: Head CT scan 08/09/2012.

CLINICAL DATA: Status post fall 02/20/2014 due to left-side
weakness.

EXAM:
CT HEAD WITHOUT CONTRAST
TECHNIQUE: Contiguous axial images were obtained from the base of the skull
through the vertex without intravenous contrast.

[Series 2: head 5.0 h31s · axial · 0.41mm/px · z∈[-161,-31]mm · 16 of 29 slices shown, 20 images]
[im 2/29  brain]
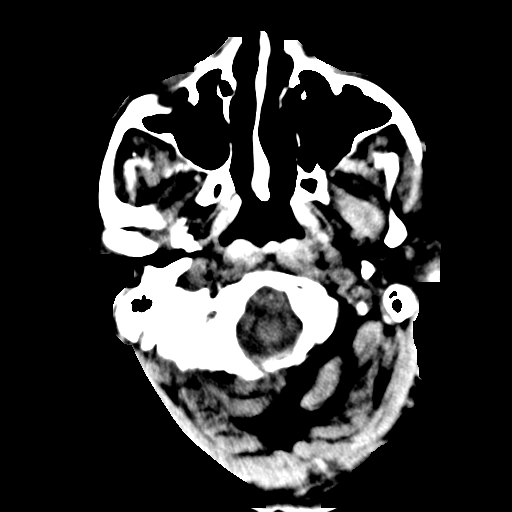
[im 2/29  bone]
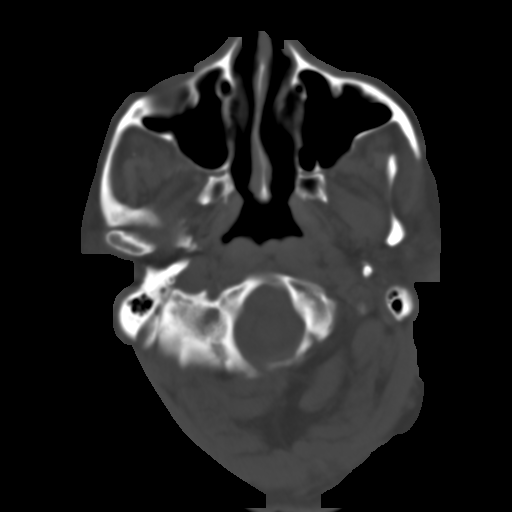
[im 4/29  brain]
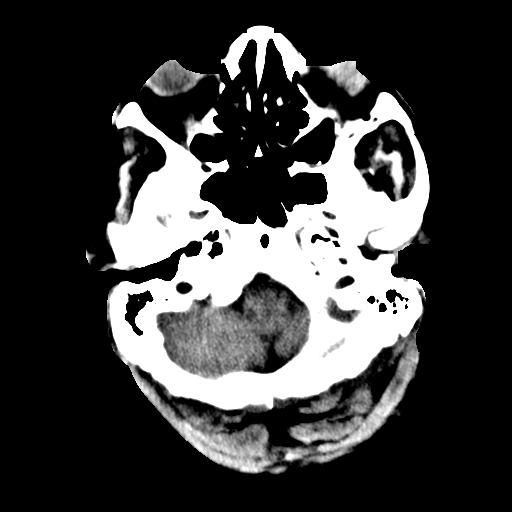
[im 6/29  brain]
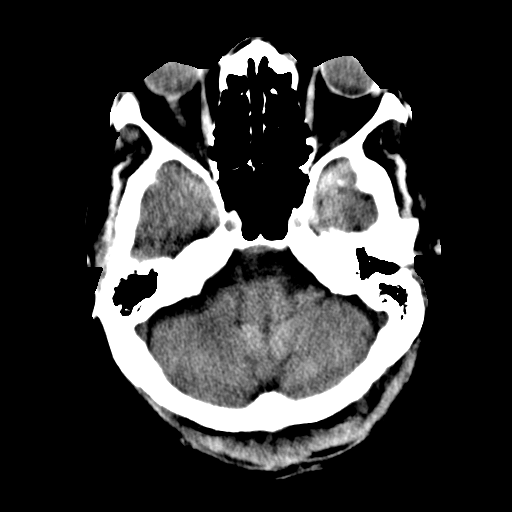
[im 7/29  brain]
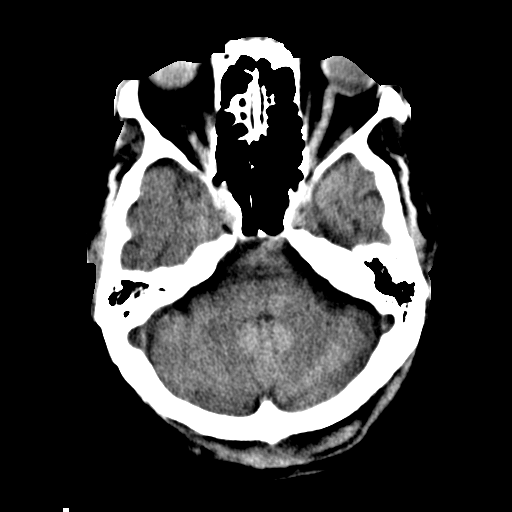
[im 9/29  brain]
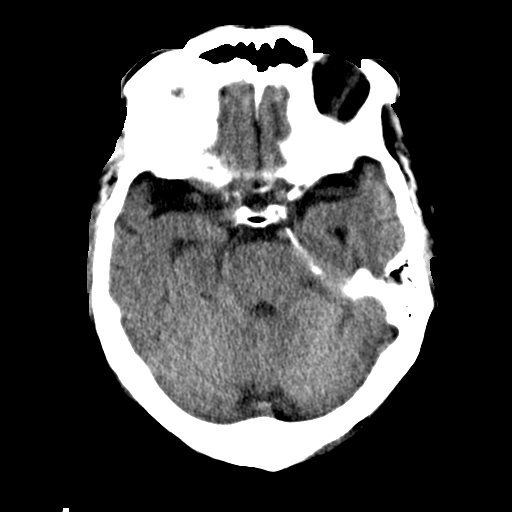
[im 9/29  bone]
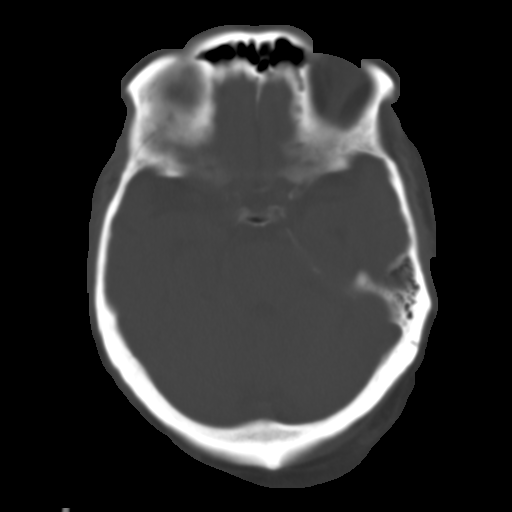
[im 11/29  brain]
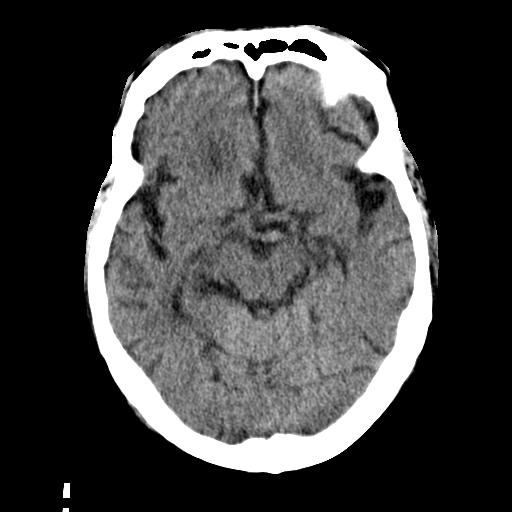
[im 12/29  brain]
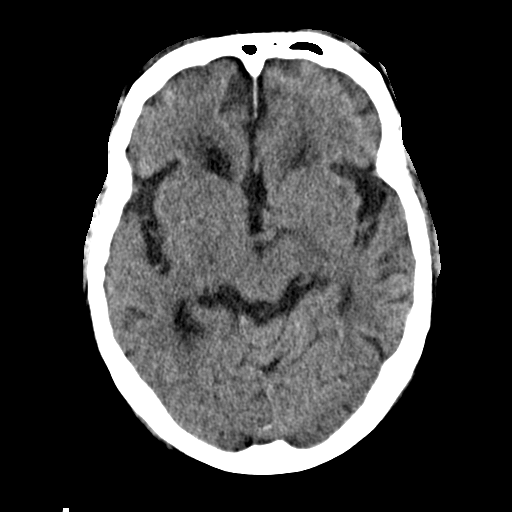
[im 14/29  brain]
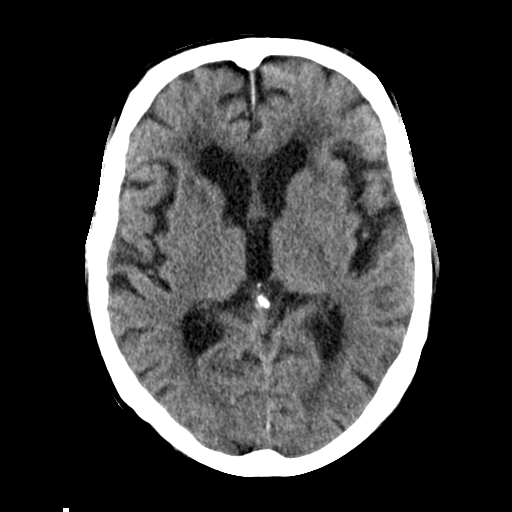
[im 16/29  brain]
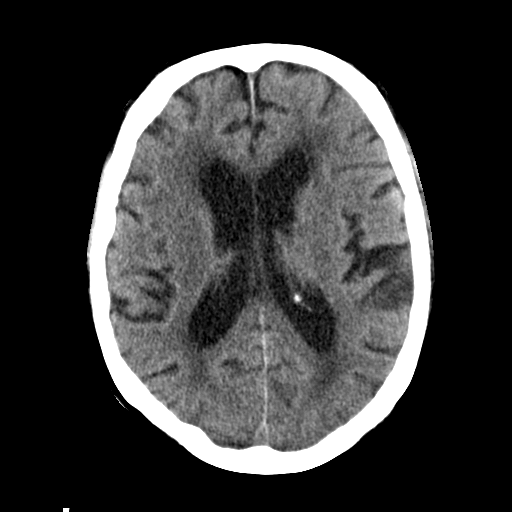
[im 16/29  bone]
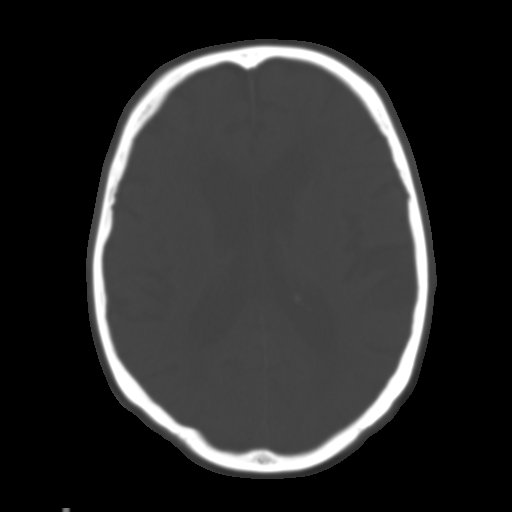
[im 18/29  brain]
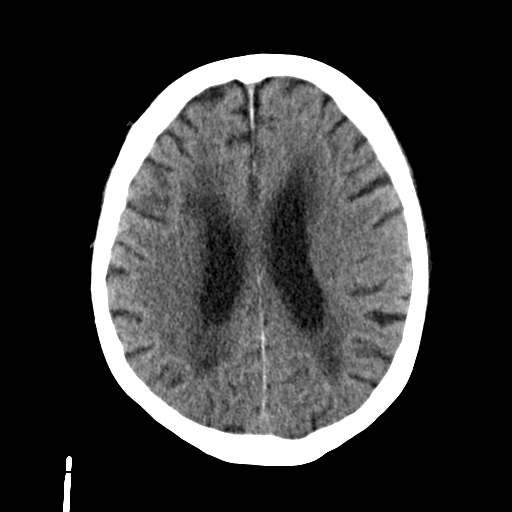
[im 19/29  brain]
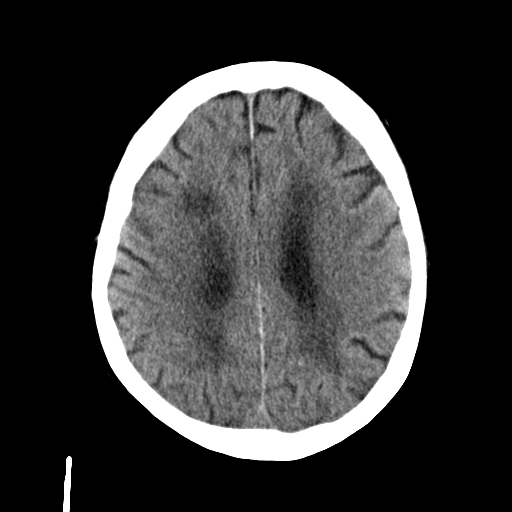
[im 21/29  brain]
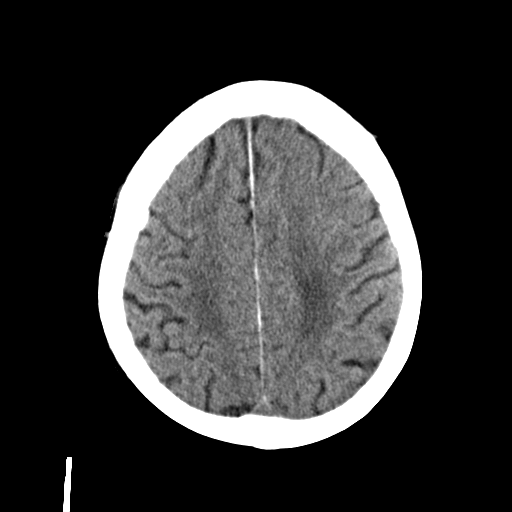
[im 23/29  brain]
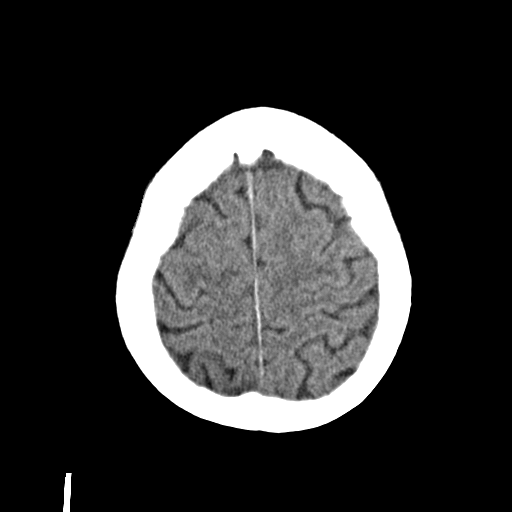
[im 23/29  bone]
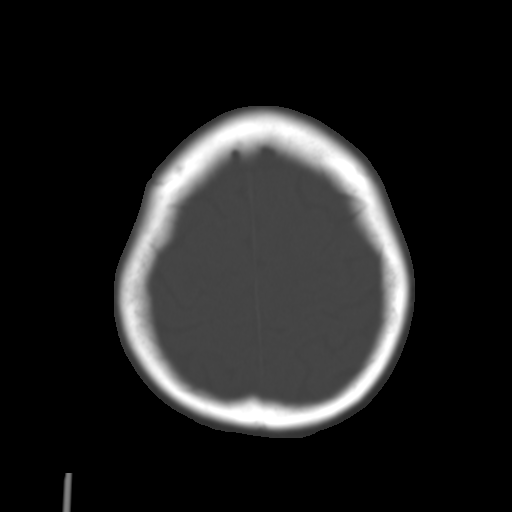
[im 24/29  brain]
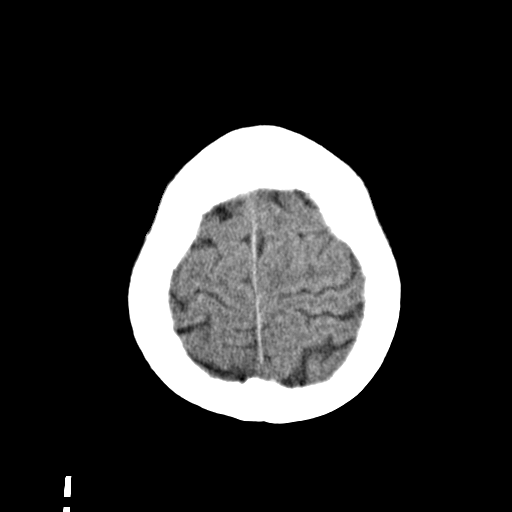
[im 26/29  brain]
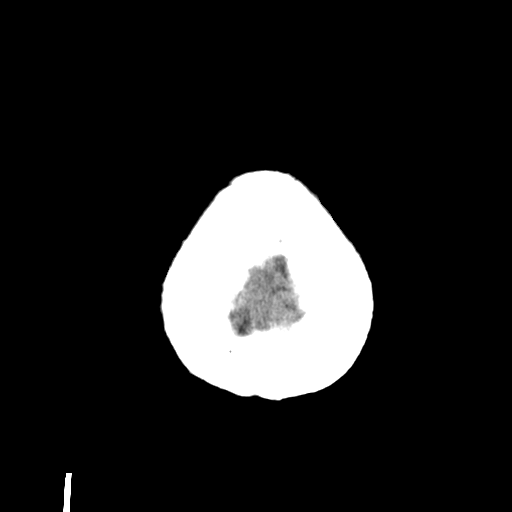
[im 28/29  brain]
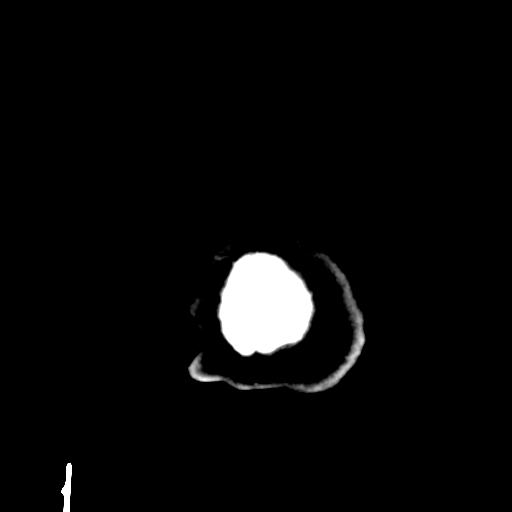

[16 of 29 positions shown; findings below may reference images not displayed]

FINDINGS: Atrophy and chronic microvascular ischemic change are identified. No
evidence of acute intracranial abnormality including hemorrhage,
infarct, mass lesion, mass effect, midline shift or abnormal
extra-axial fluid collection is identified. There is no
hydrocephalus or pneumocephalus. The calvarium is intact.
IMPRESSION: No acute abnormality.

Atrophy and chronic microvascular ischemic change.

## 2015-05-30 DEATH — deceased
